# Patient Record
Sex: Female | Born: 1945 | Race: White | Hispanic: No | Marital: Married | Smoking: Never smoker
Health system: Southern US, Community
[De-identification: ages and names within clinical notes are randomized; demographics above are authoritative.]

## PROBLEM LIST (undated history)

## (undated) DIAGNOSIS — F5105 Insomnia due to other mental disorder: Secondary | ICD-10-CM

## (undated) DIAGNOSIS — R0683 Snoring: Principal | ICD-10-CM

## (undated) DIAGNOSIS — M1990 Unspecified osteoarthritis, unspecified site: Secondary | ICD-10-CM

## (undated) DIAGNOSIS — T148XXA Other injury of unspecified body region, initial encounter: Secondary | ICD-10-CM

## (undated) DIAGNOSIS — Z8719 Personal history of other diseases of the digestive system: Secondary | ICD-10-CM

## (undated) DIAGNOSIS — F409 Phobic anxiety disorder, unspecified: Secondary | ICD-10-CM

## (undated) DIAGNOSIS — H269 Unspecified cataract: Secondary | ICD-10-CM

## (undated) DIAGNOSIS — E039 Hypothyroidism, unspecified: Secondary | ICD-10-CM

## (undated) HISTORY — PX: REPLACEMENT TOTAL KNEE: SUR1224

## (undated) HISTORY — PX: COLONOSCOPY: SHX174

## (undated) HISTORY — DX: Snoring: R06.83

## (undated) HISTORY — DX: Insomnia due to other mental disorder: F40.9

## (undated) HISTORY — DX: Other injury of unspecified body region, initial encounter: T14.8XXA

## (undated) HISTORY — PX: CATARACT EXTRACTION, BILATERAL: SHX1313

## (undated) HISTORY — PX: KNEE ARTHROSCOPY: SHX127

## (undated) HISTORY — DX: Phobic anxiety disorder, unspecified: F51.05

## (undated) HISTORY — DX: Unspecified osteoarthritis, unspecified site: M19.90

## (undated) HISTORY — DX: Unspecified cataract: H26.9

---

## 1985-10-22 HISTORY — PX: ABDOMINAL HYSTERECTOMY: SHX81

## 1985-10-22 HISTORY — PX: APPENDECTOMY: SHX54

## 1998-11-29 ENCOUNTER — Other Ambulatory Visit: Admission: RE | Admit: 1998-11-29 | Discharge: 1998-11-29 | Payer: Self-pay | Admitting: Obstetrics and Gynecology

## 1999-12-06 ENCOUNTER — Other Ambulatory Visit: Admission: RE | Admit: 1999-12-06 | Discharge: 1999-12-06 | Payer: Self-pay | Admitting: Obstetrics and Gynecology

## 2000-12-16 ENCOUNTER — Other Ambulatory Visit: Admission: RE | Admit: 2000-12-16 | Discharge: 2000-12-16 | Payer: Self-pay | Admitting: Obstetrics and Gynecology

## 2001-12-17 ENCOUNTER — Other Ambulatory Visit: Admission: RE | Admit: 2001-12-17 | Discharge: 2001-12-17 | Payer: Self-pay | Admitting: Obstetrics and Gynecology

## 2002-01-05 ENCOUNTER — Emergency Department (HOSPITAL_COMMUNITY): Admission: EM | Admit: 2002-01-05 | Discharge: 2002-01-05 | Payer: Self-pay | Admitting: Emergency Medicine

## 2002-01-05 ENCOUNTER — Encounter: Payer: Self-pay | Admitting: Emergency Medicine

## 2002-09-22 ENCOUNTER — Encounter: Payer: Self-pay | Admitting: Orthopedic Surgery

## 2002-09-23 ENCOUNTER — Encounter: Payer: Self-pay | Admitting: Orthopedic Surgery

## 2002-09-23 ENCOUNTER — Inpatient Hospital Stay (HOSPITAL_COMMUNITY): Admission: RE | Admit: 2002-09-23 | Discharge: 2002-09-26 | Payer: Self-pay | Admitting: Orthopedic Surgery

## 2002-12-17 ENCOUNTER — Other Ambulatory Visit: Admission: RE | Admit: 2002-12-17 | Discharge: 2002-12-17 | Payer: Self-pay | Admitting: Obstetrics and Gynecology

## 2003-12-20 ENCOUNTER — Other Ambulatory Visit: Admission: RE | Admit: 2003-12-20 | Discharge: 2003-12-20 | Payer: Self-pay | Admitting: Obstetrics and Gynecology

## 2004-12-26 ENCOUNTER — Other Ambulatory Visit: Admission: RE | Admit: 2004-12-26 | Discharge: 2004-12-26 | Payer: Self-pay | Admitting: Obstetrics and Gynecology

## 2005-01-11 ENCOUNTER — Inpatient Hospital Stay (HOSPITAL_COMMUNITY): Admission: RE | Admit: 2005-01-11 | Discharge: 2005-01-15 | Payer: Self-pay | Admitting: Orthopedic Surgery

## 2005-10-18 ENCOUNTER — Ambulatory Visit (HOSPITAL_COMMUNITY): Admission: RE | Admit: 2005-10-18 | Discharge: 2005-10-18 | Payer: Self-pay | Admitting: Orthopedic Surgery

## 2005-10-18 ENCOUNTER — Ambulatory Visit (HOSPITAL_BASED_OUTPATIENT_CLINIC_OR_DEPARTMENT_OTHER): Admission: RE | Admit: 2005-10-18 | Discharge: 2005-10-18 | Payer: Self-pay | Admitting: Orthopedic Surgery

## 2005-12-27 ENCOUNTER — Other Ambulatory Visit: Admission: RE | Admit: 2005-12-27 | Discharge: 2005-12-27 | Payer: Self-pay | Admitting: Obstetrics and Gynecology

## 2006-12-30 ENCOUNTER — Other Ambulatory Visit: Admission: RE | Admit: 2006-12-30 | Discharge: 2006-12-30 | Payer: Self-pay | Admitting: Obstetrics and Gynecology

## 2007-10-17 ENCOUNTER — Encounter: Admission: RE | Admit: 2007-10-17 | Discharge: 2007-10-17 | Payer: Self-pay | Admitting: Sports Medicine

## 2007-12-31 ENCOUNTER — Other Ambulatory Visit: Admission: RE | Admit: 2007-12-31 | Discharge: 2007-12-31 | Payer: Self-pay | Admitting: Obstetrics and Gynecology

## 2008-06-17 ENCOUNTER — Ambulatory Visit (HOSPITAL_BASED_OUTPATIENT_CLINIC_OR_DEPARTMENT_OTHER): Admission: RE | Admit: 2008-06-17 | Discharge: 2008-06-17 | Payer: Self-pay | Admitting: Orthopedic Surgery

## 2008-10-22 HISTORY — PX: ROTATOR CUFF REPAIR: SHX139

## 2009-01-05 ENCOUNTER — Other Ambulatory Visit: Admission: RE | Admit: 2009-01-05 | Discharge: 2009-01-05 | Payer: Self-pay | Admitting: Obstetrics and Gynecology

## 2009-01-05 ENCOUNTER — Ambulatory Visit: Payer: Self-pay | Admitting: Obstetrics and Gynecology

## 2009-01-05 ENCOUNTER — Encounter: Payer: Self-pay | Admitting: Obstetrics and Gynecology

## 2009-02-02 ENCOUNTER — Ambulatory Visit: Payer: Self-pay | Admitting: Obstetrics and Gynecology

## 2010-01-06 ENCOUNTER — Ambulatory Visit: Payer: Self-pay | Admitting: Obstetrics and Gynecology

## 2010-01-06 ENCOUNTER — Other Ambulatory Visit: Admission: RE | Admit: 2010-01-06 | Discharge: 2010-01-06 | Payer: Self-pay | Admitting: Obstetrics and Gynecology

## 2010-01-19 ENCOUNTER — Ambulatory Visit: Payer: Self-pay | Admitting: Obstetrics and Gynecology

## 2011-01-08 ENCOUNTER — Encounter (INDEPENDENT_AMBULATORY_CARE_PROVIDER_SITE_OTHER): Payer: PRIVATE HEALTH INSURANCE | Admitting: Obstetrics and Gynecology

## 2011-01-08 ENCOUNTER — Encounter: Payer: Self-pay | Admitting: Obstetrics and Gynecology

## 2011-01-08 ENCOUNTER — Other Ambulatory Visit: Payer: Self-pay | Admitting: Obstetrics and Gynecology

## 2011-01-08 ENCOUNTER — Other Ambulatory Visit (HOSPITAL_COMMUNITY)
Admission: RE | Admit: 2011-01-08 | Discharge: 2011-01-08 | Disposition: A | Discharge status: Home / Self Care | Payer: PRIVATE HEALTH INSURANCE | Source: Ambulatory Visit | Attending: Obstetrics and Gynecology | Admitting: Obstetrics and Gynecology

## 2011-01-08 DIAGNOSIS — Z01419 Encounter for gynecological examination (general) (routine) without abnormal findings: Secondary | ICD-10-CM

## 2011-01-08 DIAGNOSIS — Z124 Encounter for screening for malignant neoplasm of cervix: Secondary | ICD-10-CM | POA: Insufficient documentation

## 2011-02-27 DIAGNOSIS — Z1211 Encounter for screening for malignant neoplasm of colon: Secondary | ICD-10-CM

## 2011-03-06 NOTE — Op Note (Signed)
NAMEALEERA, GILCREASE                ACCOUNT NO.:  [id number]   MEDICAL RECORD NO.:  [id number]          PATIENT TYPE:  AMB   LOCATION:  DSC                          FACILITY:  [facility]   PHYSICIAN:  Loreta Ave, M.D. DATE OF BIRTH:  [birth year]   DATE OF PROCEDURE:  06/17/2008  DATE OF DISCHARGE:                               OPERATIVE REPORT   PREOPERATIVE DIAGNOSES:  Right shoulder impingement, possible rotator  cuff tear, distal clavicle osteolysis, and degenerative arthritis with  loose bodies, glenohumeral joint.   POSTOPERATIVE DIAGNOSES:  Right shoulder impingement, possible rotator  cuff tear, distal clavicle osteolysis, and degenerative arthritis with  loose bodies, glenohumeral joint, with a full-thickness tear,  supraspinatus tendon, in the anterior aspect; grade 4 changes,  glenohumeral joint, numerous loose bodies with degenerative labral  tears.   PROCEDURES:  Right shoulder examination under anesthesia, arthroscopy,  debridement of glenohumeral joint with chondroplasty, removal of loose  bodies, debridement of labrum, bursectomy, acromioplasty, and  coracoacromial ligament release; excision, distal clavicle;  arthroscopically-assisted rotator cuff repair with fiber weave suture  and Swivel-Lock anchor x1.   SURGEON:  Loreta Ave, MD   ASSISTANT:  Genene Churn. Barry Dienes, [place].  Presence throughout the entire case was  necessary for timely completion of procedure.   ANESTHESIA:  General.   BLOOD LOSS:  Minimal.   SPECIMENS:  None.   COUNTS:  None.   COMPLICATIONS:  None.   DRESSING:  Soft, compressive, with shoulder immobilizer.   DESCRIPTION OF PROCEDURE:  The patient was brought to the operating  room, placed on operating table in supine position.  After adequate  anesthesia had been obtained placed into a beachchair position on the  shoulder positioner, prepped and draped in the usual sterile fashion.  Shoulder examined; grating and crepitus and a little  stiffness but no  significant adhesions.  Three portals created anterior, posterior, and  lateral.  Shoulder entered with blunt obturator.  Arthroscope  introduced, shoulder distended and inspected.  Extensive grade 3 and 4  changes throughout the shoulder.  Numerous loose bodies, chondral and  osteochondral, removed with shaver and grasping forceps.  Degenerative  circumferential tearing of the labrum debrided.  Chondroplastied to a  stable surface.  More than half the glenoid and a third of the humeral  head were down to bone.  Biceps tendon and biceps anchor intact.  Capsule ligamentous structures intact.  Undersurface of the cuff  initially appeared to be not torn but was very thin over the anterior  aspect of supraspinatus tendon.  Cannula redirected subacromially.  Confirmed functional full-thickness tear, supraspinatus tendon, in the  anterior aspect.  Debrided, mobilized, and the tuberosity roughened to  bleeding bone.  A lot of attrition in the cuff but still reasonable  tissue quality for repair.  Type 3 acromion.  Reactive bursitis.  Bursa  resected.  Acromioplasty to a type 1 acromion with shaver and high-speed  burr releasing CA ligament with cautery.  Distal clavicle:  Grade 4  changes, extensive spurs, especially inferiorly.  Periarticular spurs  and lateral centimeter of clavicle resected.  At completion  adequacy of  decompression and clavicle excision confirmed viewing from all portals.  Lateral portal was then opened further and a cannula placed through  that.  The Scorpion device was then used to place a horizontal mattress  suture in the mobilized cuff.  Brought out anteriorly and then out  through the lateral portals.  A drill hole was made just lateral to the  peak of the tuberosity for the Swivel-Lock anchor.  Sutures attached to  the Swivel-Lock anchor, which was then firmly implanted down in the  humerus at the tuberosity.  Bone was a little bit soft but  reasonable  and did allow good fixation, repair, and tensioning of the repair of the  suture through the Swivel-Lock device.  Sutures were cut.  The shoulder  was then viewed from all angles.  Nice firm watertight closure without  undue tension and adequate decompression confirmed.  Instruments and  fluid removed.  Portals closed with nylon.  Sterile compressive dressing  applied.  Shoulder immobilizer applied.  Anesthesia reversed.  Brought  to recovery room.  Tolerated the surgery well.  No complications.      Loreta Ave, M.D.  Electronically Signed     DFM/MEDQ  D:  06/17/2008  T:  06/18/2008  Job:  [id number]

## 2011-03-09 NOTE — Op Note (Signed)
[name], [name]                            ACCOUNT NO.:  [id number]   MEDICAL RECORD NO.:  [id number]                   PATIENT TYPE:  INP   LOCATION:  2550                                 FACILITY:  [facility]   PHYSICIAN:  Loreta Ave, M.D.              DATE OF BIRTH:  1945/11/29   DATE OF PROCEDURE:  09/23/2002  DATE OF DISCHARGE:                                 OPERATIVE REPORT   PREOPERATIVE DIAGNOSIS:  End-stage degenerative arthritis, right knee with  valgus alignment and lateral patellar femoral tracking and tethering.   POSTOPERATIVE DIAGNOSIS:  End-stage degenerative arthritis, right knee with  valgus alignment and lateral patellar femoral tracking and tethering.   OPERATION:  Right total knee replacement utilizing Osteonics prosthesis.  Press fit posterior stabilizing #9 femoral component.  Cemented #7 tibial  compartment with 10 mm polyethylene flex insert.  Cemented recessed nonmetal  backed 28 mm patellar compartment.  Soft tissue balancing with lateral  capsular release and lateral retinacular release.   SURGEON:  Loreta Ave, M.D.   ASSISTANT:  Arlys John D. Petrarca, P.A.-C.   ANESTHESIA:  General   ESTIMATED BLOOD LOSS:  Minimal   TOURNIQUET TIME:  One hour and 10 minutes   SPECIMENS:  Excised bone and soft tissues.   CULTURES:  None   COMPLICATIONS:  None   DRESSING:  Soft compressive and knee immobilizer.   DRAINS:  Hemovac x 2   DESCRIPTION OF PROCEDURE:  The patient was brought to the operating room and  placed on the operating table in the supine position.  After adequate  anesthesia had been obtained, the right knee was examined.  Mild pressure  contracture.  Further flexion 130 degrees.  The lateral capsular tightness  with lateral tracking, tethering and crepitus of the patellar femoral joint.  Alignment more than 10 degrees of valgus correctable to about 10 degrees of  valgus.  Stable ligaments.  Tourniquet applied.  Prepped and draped  in the  usual sterile fashion.  Exsanguinated with elevation of Esmarch.  Tourniquet  inflated to 300 mmHg.  Anterior incision above the patella, advanced to the  tibial tubercle.  Medial parapatellar arthrotomy exposing the knee.  Grade  IV changes of the patellar femoral joint and lateral components.  Remnants  of meniscus, cruciate ligament all debrided.  Periarticular spurs removed.  Distal femur exposed.  Intramedullary guide placed.  Distal cut removing 10  mm. set at 5 degrees of valgus.  Moderately deficiency laterally but able to  overcome this with a 10 mm cut.  Sized for a #9 component which was a little  surprising given the patient's size but was the appropriate size.  Cut was  made for the #9 posterior stabilizing component.  Trial put in place and  found to fit well.  Trial was removed.  Tibia exposed.  Tibia spine removed  with the saw.  Intramedullary guide placed.  Proximal cut removing 4 to 5 mm  off the deficient side with a 5 degree posterior slope cut.  Patella was  sized, reamed and drilled for a 28 mm component.  Trial was put in place.  #9 on the femur, #7 on the tibia.  With the 10 mm insert and the 28 mm  patellar in place, I had full extension and full flexion and nicely balanced  knee.  Lateral release performed and following this, I had good patellar  femoral tracking.  All trials were removed.  Copious irrigation with a pulse  irrigating device.  Repaired, placed on the tibial component which was  hammered in place.  Polyethylene intact.  Femoral component seated.  Knee  reduced.  Patellar component cemented into its recess in the patella.  All  excess cement removed.  Once the cement hardened, the knee was irrigated  again.  Hemovacs were placed and brought out through separate stab wounds.  Arthrotomy was closed with #1 Vicryl.  The skin and subcutaneous tissue was  closed with Vicryl and staples.  Margins of the wound of the knee injected  with Marcaine.   Sterile compressive dressing applied.  Tourniquet deflated  and removed.  Knee immobilizer was applied.  Anesthesia was reversed.  Brought to the recovery room.  Tolerated the surgery well with no  complications.                                                Loreta Ave, M.D.    DFM/MEDQ  D:  09/23/2002  T:  09/23/2002  Job:  [id number]

## 2011-03-09 NOTE — Op Note (Signed)
[name], [name]                ACCOUNT NO.:  [id number]   MEDICAL RECORD NO.:  [id number]          PATIENT TYPE:  INP   LOCATION:  5008                         FACILITY:  [facility]   PHYSICIAN:  Loreta Ave, M.D. DATE OF BIRTH:  [birth year]   DATE OF PROCEDURE:  01/11/2005  DATE OF DISCHARGE:                                 OPERATIVE REPORT   PREOPERATIVE DIAGNOSES:  End stage degenerative arthritis, left knee with  valgus alignment.   POSTOPERATIVE DIAGNOSES:  End stage degenerative arthritis, left knee with  valgus alignment.   OPERATION PERFORMED:  Left total knee replacement, Osteonics prosthesis.  Soft tissue balancing.  Cemented #9 posterior stabilized femoral component.  Cemented #7 tibial component with a 10 mm polyethylene Flex insert.  Cemented recessed 28 mm patellar component.   SURGEON:  Loreta Ave, M.D.   ASSISTANT:  Genene Churn. Denton Meek.   ANESTHESIA:  General.   ESTIMATED BLOOD LOSS:  Minimal.   TOURNIQUET TIME:  One hour and 20 minutes.   SPECIMENS:  Excised bone and soft tissue.   CULTURES:  None.   COMPLICATIONS:  None.   DRESSING:  Soft compressive with knee immobilizer.   DRAINS:  Times two.   DESCRIPTION OF PROCEDURE:  The patient was brought to the operating room and  after adequate anesthesia had been obtained, the left knee examined.  Good  extension, flexion to 130 degrees, increased valgus about 10 degrees,  correctable to about 5 degrees.  Lateral tracking patellofemoral joint but  not that much tethering.  Tourniquet applied.  Leg prepped and draped in the  usual sterile fashion.  Exsanguinated with elevation and Esmarch.  Tourniquet inflated to 350 mmHg.  Straight incision above the patella down  to the tibial tubercle.  Skin and subcutaneous tissue divided.  Medial  parapatellar arthrotomy.  Grade 4 changes, especially patella and lateral  compartment.  Medial capsular release for exposure.  Remnants of menisci,  cruciate  ligaments were removed.  Distal femur exposed.  Intramedullary  guide placed.  Distal cut removing 10 mm.  Sized for a #9 component which I  intentionally externally rotated to match the epicondylar axis and to  improve patellofemoral tracking.  Definitive cuts made.  I notched the front  of the cortex of the femur anterolaterally because of the external rotation  I had set but this was only about a millimeter.  It was smoothed.  Trial put  in place found to fit well after cuts were made on the femur.  Proximal  tibia exposed.  Tibial spine removed with a saw.  Sized for #7component.  Proximal cut with intramedullary device with a 5 degree posterior slope cut  removing 6 mm off the medial side.  Once that was complete, all recess  examined, all fragments, all spurs removed.  Patella was then sized, reamed  and drilled for a 28 mm component.  Trial was put in place.  #9 on the  femur, #7 on the tibia and a 10 mm Flex insert.  With this construct and  with the patellar trial component in place, I  had full extension, full  flexion, no lift off in flexion and good patellofemoral tracking and  stability. The tibia was marked for rotation and then hand reamed.  All  trials were removed.  Wound copiously irrigated with a pulse irrigating  device.  Cement placed on all components which were then firmly seated with  polyethylene attached to the tibia.  Once the cement hardened, the knee was  re-examined.  Full extension, full flexion and good stability and good  patellofemoral tracking.  Wound irrigated.  Hemovac was placed and brought  out through separate stab wounds.  Arthrotomy closed with  #1 Vicryl.  Skin  and subcutaneous tissue with Vicryl and staples.  Sterile compressive  dressing applied with knee immobilizer.  Anesthesia reversed after  tourniquet deflated.  Brought to recovery room.  Tolerated surgery well.  No  complications.      DFM/MEDQ  D:  01/11/2005  T:  01/11/2005  Job:   [id number]

## 2011-03-09 NOTE — Op Note (Signed)
NAMESAN, RUA                ACCOUNT NO.:  [id number]   MEDICAL RECORD NO.:  [id number]          PATIENT TYPE:  AMB   LOCATION:  DSC                          FACILITY:  [facility]   PHYSICIAN:  Loreta Ave, M.D. DATE OF BIRTH:  07-Dec-1945   DATE OF PROCEDURE:  10/18/2005  DATE OF DISCHARGE:                                 OPERATIVE REPORT   PREOPERATIVE DIAGNOSIS:  Triggering of A1 pulley, right thumb.   POSTOPERATIVE DIAGNOSIS:  Triggering of A1 pulley, right thumb.   PROCEDURE:  Release, A1 pulley, right thumb.   SURGEON:  Loreta Ave, M.D.   ASSISTANT:  Genene Churn. Denton Meek.   ANESTHESIA:  General.   ESTIMATED BLOOD LOSS:  Minimal.   TOURNIQUET TIME:  25 minutes.   SPECIMENS:  None.   CULTURES:  None.   COMPLICATIONS:  None.   DRESSING:  Soft compressive.   PROCEDURE:  The patient was brought to the operating room and after adequate  anesthesia had been obtained, tourniquet applied to the upper aspect of the  right arm.  Prepped and draped in the usual sterile fashion.  Exsanguinated  with elevation and Esmarch, tourniquet inflated to 250 mmHg.  A small  transverse incision to the volar aspect of the thumb over the A1 pulley.  Skin and subcutaneous tissue divided.  Neurovascular bundles identified,  protected and retracted.  The A1 pulley released in its entirety,  obliterating all triggering.  Some nodular density of the tendon recognized  but at completion, all triggering obliterated.  Wound irrigated.  Neurovascular  bundles inspected and intact.  Skin closed with nylon.  Margins of the wound  injected with Marcaine.  Sterile compressive dressing applied.  Tourniquet  deflated and removed.  Anesthesia reversed.  Brought to the recovery room.  Tolerated the surgery well, no complications.      Loreta Ave, M.D.  Electronically Signed     DFM/MEDQ  D:  10/18/2005  T:  10/18/2005  Job:  [id number]

## 2011-03-09 NOTE — Discharge Summary (Signed)
[name], [name]                ACCOUNT NO.:  [id number]   MEDICAL RECORD NO.:  [id number]          PATIENT TYPE:  INP   LOCATION:  5008                         FACILITY:  [facility]   PHYSICIAN:  Loreta Ave, M.D. DATE OF BIRTH:  08/13/1946   DATE OF ADMISSION:  01/11/2005  DATE OF DISCHARGE:  01/15/2005                                 DISCHARGE SUMMARY   FINAL DIAGNOSES:  1.  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  2.  Hypokalemia.   HISTORY OF PRESENT ILLNESS:  A 65 year old white female with a history of  end stage DJD left knee and chronic pain, presented to our office for  preoperative evaluation for a total knee replacement.  She had progressive  worsening symptoms with failed response to conservative treatment.  Significant decrease in her daily activities due to the ongoing complaint.  Pre-op x-rays showed end stage DJD with bone-on-bone changes and bone spurs.   HOSPITAL COURSE:  January 11, 2005, the patient was taken to the [facility]  Operating Room and a left total knee replacement procedure performed.  Surgeon was Loreta Ave, M.D. and assistant was Fayrene Fearing __________  [facility]  Anesthesia general.  EDL minimal.  Tourniquet time 80 minutes.  Two Hemovac  drains placed.  There were no surgical or anesthesia complications and the  patient was transferred to recovery in stable condition.  January 12, 2005, no specific complaints.  Temp 100.2, pulse 95, respirations  18, blood pressure 113/70.  Hemoglobin 10.7.  INR 1.0.  Dressing:  Clean,  dry, and intact.  __________  nontender.  Neurovascularly intact distally.  Pharmacy protocol Coumadin started.  January 13, 2005, patient doing well.  Slow with PT day before due to PCA pump  not working.  Vital signs stable.  Temp 99.9.  Hemoglobin 10.2.  Potassium  3.2.  INR 1.5.  Wound looked good.  Staples intact.  No signs of infection.  Calf nontender.  Neurovascularly intact  distally.  Hemovac drains x 2 pulled  and dressing changed.  D/C'd morphine PCA, O2, and Foley.  Hep-Lock IV.  KCl  20 mEq p.o. every day ordered.  January 14, 2005, the patient complained of some nausea, bloating, and minimal  abdominal discomfort.  No BM.  Minimal flatus and belching.  Dulcolax  suppository x 1 yesterday.  Complained of some left proximal calf  discomfort.  On exam abdomen slightly tender to palpation.  No palpable  masses.  Left lower extremity wound looked good.  No signs of infection.  Staples intact.  Proximal calf mildly tender to palpation, neurovascularly  intact.  Hemoglobin at 9.4.  Potassium 2.8.  INR 2.0.  Sodium 131.  Fleet  enema ordered.  D/C'd Percocet and started Darvocet-N 100 1-2 tabs p.o. q.4-  6h. p.r.n. for pain.  Increase potassium to 40 mEq p.o. x 1 dose, then 20  mEq later that evening, and then resume normal dose.  Venous Doppler ordered  for left lower extremity rule out DVT.  January 15, 2005, the patient doing much  better.  No complaints of abdominal  pain or nausea.  Had a small bowel movement.  Stated that she was ready to  go home.  Ambulated down hall and completed stairs.  Temp 98.7, pulse 87,  respirations 18, blood pressure 116/69.  Hemoglobin 9.7.  INR 1.9.  Range of  motion 0-80 degrees.  Wound looked good.  Staples intact.  No signs of  infection.   CONDITION:  Good and stable.   MEDICATIONS:  1.  Darvocet-N 100 1-2 tabs p.o. q.4-6h. p.r.n. for pain.  2.  Coumadin x 4 weeks postoperative discomfort prophylaxis.  3.  Robaxin 500 mg p.o. q.6h. p.r.n. for spasms.  4.  Phenergan 12.5 mg one tab p.o. q.6h. p.r.n. for nausea.   DISPOSITION:  Discharged home.   INSTRUCTIONS:  1.  The patient will continue to work with home health PT to improve range      of motion and strength and ambulation.  2.  Home health RN to monitor PT INR x 4 weeks while she is on Coumadin.  3.  Staples to be removed two weeks post-op.  4.  Will follow up in  the office in two weeks for recheck and return sooner      as needed.       /MEDQ  D:  04/07/2005  T:  04/07/2005  Job:  [id number]

## 2011-03-09 NOTE — Discharge Summary (Signed)
[name], [name]                            ACCOUNT NO.:  [id number]   MEDICAL RECORD NO.:  [id number]                   PATIENT TYPE:  INP   LOCATION:  5004                                 FACILITY:  [facility]   PHYSICIAN:  Loreta Ave, M.D.              DATE OF BIRTH:  1945/11/23   DATE OF ADMISSION:  09/23/2002  DATE OF DISCHARGE:  09/26/2002                                 DISCHARGE SUMMARY   ADMISSION DIAGNOSIS:  Advanced degenerative joint disease of the right knee.   DISCHARGE DIAGNOSIS:  Advanced degenerative joint disease of the right knee.   PROCEDURE:  Right total knee replacement.   HISTORY OF PRESENT ILLNESS:  A very pleasant 65 year old female with  unrelenting pain in the right knee with progressive, advancing DJD.  She now  has a marked valgus deformity with lateral pain.  She had an arthroscopy in  2002 which revealed grade 4 DJD.  Because of failure with conservative  treatment, she is now indicated for a total knee replacement.   HOSPITAL COURSE:  A 65 year old female admitted 09/23/02 after appropriate  laboratory studies were obtained, as well as 1 g of Ancef IV on call to the  operating room, was taken to the operating room where she underwent a right  total knee replacement.  She tolerated the procedure well.  An epidural was  placed for postoperative pain management.  Preop Foley was placed.  The  patient was started on heparin 5000 units subcu q.12h. until her Coumadin  became therapeutic.  Consultations with PT and OT were made.  Total knee  replacement protocols were followed.  With ambulation and PT weightbearing  as tolerated on the right, she was allowed out of bed to the chair the  following day.  Her epidural was discontinued on 09/25/02.  She did have some  hypokalemia which was corrected with oral potassium.  An antispasmodic,  Robaxin 500 mg q.6-8h. p.r.n., was also used.  When she was ambulatory and  stable, it was felt that she was able then  to be discharged.  This was  accomplished on 09/26/02.  She was discharged in good condition.  EKG  revealed normal sinus rhythm with short PR.  T wave abnormality, consider  anterior ischemia.  Radiographic studies:  X-ray of the right reveals right  total arthroplasty implants.   LABORATORY DATA:  Preop hemoglobin 13.2, hematocrit 38.0%, white count 5100,  platelets 180,000.  Discharge hemoglobin 8.5, hematocrit 24.1%.  Chemistries  preop:  Sodium 137, potassium 4.0, chloride 101, CO2 29, glucose 89, BUN 16,  creatinine 0.6, calcium 9.3, total protein 6.8, albumin 4.0.  Sodium 139,  potassium 3.1, chloride 103, CO28, glucose 97, BUN 2, creatinine 0.7, and  calcium 8.5.  Urinalysis was benign for voided urine.  She was blood type O  positive, antibody screen negative.   DISCHARGE INSTRUCTIONS:  She will resume her preop medications.  Coumadin as  directed by [place].  Iron supplementation tablet b.i.d.  Percocet 5/325 1-2  tablets q.4h. as needed for pain.  Robaxin 500 mg 1-2 tablets q.6h. as  needed for spasm.  She will be weightbearing as tolerated on the right with  a walker.  This is tied with physical therapy.  Home CPM, 0-90 degrees, six  to eight hours per day.  There is no restriction in her diet.  Keep incision  clean and dry and may shower.  Follow back up with [place] in 7-10 days for  recheck evaluation and staple removal.  Call if increase in temperature,  chills, pain unrelieved by medicines or foul smelling drainage from the  wound.   DISPOSITION:  Discharged in improved condition.     Oris Drone Petrarca, P.A.-C.                Loreta Ave, M.D.    BDP/MEDQ  D:  11/08/2002  T:  11/09/2002  Job:  [id number]

## 2011-03-09 NOTE — Op Note (Signed)
   NAME:  [name], [name]                            ACCOUNT NO.:  [id number]   MEDICAL RECORD NO.:  [id number]                   PATIENT TYPE:  INP   LOCATION:  2550                                 FACILITY:  [facility]   PHYSICIAN:  Kaylyn Layer. Michelle Piper, M.D.                DATE OF BIRTH:  15-Sep-1946   DATE OF PROCEDURE:  09/23/2002  DATE OF DISCHARGE:                                 OPERATIVE REPORT   PROCEDURE:  Epidural catheter placement for postoperative pain relief.   FOLLOW UP:  Followup with Dr. Richardson Landry.   DESCRIPTION OF PROCEDURE:  The risks and benefits were discussed with the  patient ahead of time and she agreed to the catheter placement. At the end  of the procedure prior to emerging from anesthesia, the patient was turned  in the right lateral decubitus position. The back was prepped with Betadine.   Using a #17 Tuohy needle, the epidural space was easily cannulated in the  midline utilizing loss of resistance technique. The catheter was inserted 5  cm into the epidural space after negative aspiration. Then 100 mcg of  fentanyl and 10 cc of 100% Xylocaine was injected.   The patient was  then turned supine and laryngeal mask airway was removed  and the patient was then to the recovery room in stable condition. In the  PACU a fentanyl/Marcaine infusion was begun and should be followed on the  floor by the anesthesiology service.                                                 Kaylyn Layer Michelle Piper, M.D.    KDO/MEDQ  D:  09/23/2002  T:  09/23/2002  Job:  [id number]

## 2011-11-13 ENCOUNTER — Encounter: Payer: Self-pay | Admitting: Obstetrics and Gynecology

## 2012-01-03 DIAGNOSIS — N809 Endometriosis, unspecified: Secondary | ICD-10-CM | POA: Insufficient documentation

## 2012-01-03 DIAGNOSIS — M8580 Other specified disorders of bone density and structure, unspecified site: Secondary | ICD-10-CM | POA: Insufficient documentation

## 2012-01-14 ENCOUNTER — Ambulatory Visit (INDEPENDENT_AMBULATORY_CARE_PROVIDER_SITE_OTHER): Payer: Medicare Other | Admitting: Obstetrics and Gynecology

## 2012-01-14 ENCOUNTER — Telehealth: Payer: Self-pay | Admitting: *Deleted

## 2012-01-14 ENCOUNTER — Encounter: Payer: Self-pay | Admitting: Obstetrics and Gynecology

## 2012-01-14 VITALS — BP 120/64 | Ht 64.0 in | Wt 132.0 lb

## 2012-01-14 DIAGNOSIS — R350 Frequency of micturition: Secondary | ICD-10-CM

## 2012-01-14 DIAGNOSIS — Z780 Asymptomatic menopausal state: Secondary | ICD-10-CM

## 2012-01-14 DIAGNOSIS — M899 Disorder of bone, unspecified: Secondary | ICD-10-CM

## 2012-01-14 DIAGNOSIS — M8580 Other specified disorders of bone density and structure, unspecified site: Secondary | ICD-10-CM

## 2012-01-14 DIAGNOSIS — N952 Postmenopausal atrophic vaginitis: Secondary | ICD-10-CM

## 2012-01-14 DIAGNOSIS — R82994 Hypercalciuria: Secondary | ICD-10-CM

## 2012-01-14 DIAGNOSIS — N951 Menopausal and female climacteric states: Secondary | ICD-10-CM

## 2012-01-14 MED ORDER — ZOSTER VACCINE LIVE 19400 UNT/0.65ML ~~LOC~~ SOLR: 0.6500 mL | Freq: Once | SUBCUTANEOUS | Status: AC

## 2012-01-14 MED ORDER — TRIAMTERENE-HCTZ 37.5-25 MG PO CAPS: 1.0000 | ORAL_CAPSULE | ORAL | Status: DC

## 2012-01-14 NOTE — Telephone Encounter (Signed)
rx sent in patient informed.

## 2012-01-14 NOTE — Progress Notes (Signed)
Patient came back to see me today for further followup. 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 She is taking Dyazide for treatment. When we last did her 24-hour urine collection and was stable. We have had her see Dr. Valarie Cones because of height loss and some osteopenia. Her last bone density done in 2012 showed a worse T score of -1.6 without  an elevated FRAX risk. It was stable. She's had no fractures. She takes calcium and vitamin D. She is up-to-date on mammograms. She has no vaginal bleeding. She's had no pelvic pain. She does have hot flashes but feels they're tolerable. She does have atrophic vaginitis with some discomfort with intercourse but they did do  not have intercourse enough to make it worth treating in her mind. She does have some urinary frequency without significant urgency, dysuria, hematuria, or incontinence.  ROS: 12 system review done. Pertinent positives above.  HEENT: Within normal limits.  Kennon Portela present Neck: No masses. Supraclavicular lymph nodes: Not enlarged. Breasts: Examined in both sitting and lying position. Symmetrical without skin changes or masses. Abdomen: Soft no masses guarding or rebound. No hernias. Pelvic: External within normal limits. BUS within normal limits. Vaginal examination shows poor estrogen effect, no cystocele enterocele or rectocele. Cervix and uterus absent. Adnexa within normal limits. Rectovaginal confirmatory. Extremities within normal limits.  Assessment: #1. Menopausal symptoms #2. Atrophic vaginitis #3. Urinary frequency #4. Osteopenia with hypercalciuria.  Plan: Continue yearly mammograms. Continue periodic bone densities. Continue Dyazide.

## 2012-01-14 NOTE — Telephone Encounter (Signed)
Patient said she forgot to ask for prescription for Shingles Vaccine to be sent to her [facility] pharmacy in [place].

## 2012-01-14 NOTE — Telephone Encounter (Signed)
Please send

## 2012-01-15 LAB — URINALYSIS W MICROSCOPIC + REFLEX CULTURE
Bacteria, UA: NONE SEEN
Crystals: NONE SEEN
Ketones, ur: NEGATIVE mg/dL
Nitrite: NEGATIVE
Specific Gravity, Urine: 1.019 (ref 1.005–1.030)
Squamous Epithelial / LPF: NONE SEEN
Urobilinogen, UA: 0.2 mg/dL (ref 0.0–1.0)

## 2012-07-29 ENCOUNTER — Telehealth: Payer: Self-pay | Admitting: Family Medicine

## 2012-07-29 NOTE — Telephone Encounter (Signed)
Her husband, a patient of mine, sent me a e mail asking me if I would see her for an acute visit. She's been a patient of mine in the past. I told him I would agree to do this. I had no knowledge that an  appointment had been  made with Jae Dire. Confer with the patient and see if she does indeed want to see me

## 2012-07-29 NOTE — Telephone Encounter (Signed)
Also note Patient has medicare

## 2012-07-29 NOTE — Telephone Encounter (Signed)
I'm confused- pt is going to see me prior to establishing?  But she's medicare and i'm not taking medicare?  And spouse said this was ok b/c Dr Alwyn Ren ok'd this?  Someone needs to clear this up for me and let me know what's going on.  I'm not sure why other people are ok-ing new pt add ons to my schedule in a 15 min slot (especially for new medicare)

## 2012-07-29 NOTE — Telephone Encounter (Signed)
new pt on schedule for Thursday per spouse, per hopper pt could be seen by you prior to being seen by you for Establish care visit, please advise if ok?

## 2012-07-30 ENCOUNTER — Ambulatory Visit (INDEPENDENT_AMBULATORY_CARE_PROVIDER_SITE_OTHER): Payer: Medicare Other | Admitting: Internal Medicine

## 2012-07-30 VITALS — BP 138/82 | HR 80 | Temp 98.2°F | Wt 135.0 lb

## 2012-07-30 DIAGNOSIS — J209 Acute bronchitis, unspecified: Secondary | ICD-10-CM

## 2012-07-30 MED ORDER — HYDROCODONE-ACETAMINOPHEN 7.5-500 MG PO TABS: 1.0000 | ORAL_TABLET | ORAL | Status: DC | PRN

## 2012-07-30 MED ORDER — PREDNISONE 20 MG PO TABS: 20.0000 mg | ORAL_TABLET | Freq: Two times a day (BID) | ORAL | Status: DC

## 2012-07-30 MED ORDER — DOXYCYCLINE HYCLATE 100 MG PO TABS: 100.0000 mg | ORAL_TABLET | Freq: Two times a day (BID) | ORAL | Status: DC

## 2012-07-30 NOTE — Patient Instructions (Addendum)
If you activate My Chart; the results can be released to you as soon as they populate from the lab. If you choose not to use this program; the labs have to be reviewed, copied & mailed   causing a delay in getting the results to you. 

## 2012-07-30 NOTE — Telephone Encounter (Signed)
[name] stated his wife was wanting to see [name] and per his email from [name]., that was ok. At the time I did not have the email that is why I sent the note. After receiving the email [name] noted to his patient that he would see the spouse unless she wanted to see [name]. I think [name] took this as it was ok for his wife to see whomever she wanted.  I can call the patient back to clarify that she can only see hopper.

## 2012-07-30 NOTE — Progress Notes (Signed)
  Subjective:    Patient ID: [name], female    DOB: Apr 06, 1946, 66 y.o.   MRN: [id number]  HPI  She has had a persistent cough over the last 11 days; onset was laryngitis and sore throat. The cough has not been significantly responsive to [employer]. She has been intolerant to codeine in the past. She has no significant smoking or asthma histories.  Within the last several days she's begun to produce some yellow sputum. There is no associated wheezing or shortness of breath.   Review of Systems  She specifically denies fever, chills, sweats, frontal headache, facial pain, nasal purulence, dental pain, otic pain or discharge      Objective:   Physical Exam General appearance:good health ;well nourished; no acute distress or increased work of breathing is present.  No  lymphadenopathy about the head, neck, or axilla noted.   Eyes: No conjunctival inflammation or lid edema is present.   Ears:  External ear exam shows no significant lesions or deformities.  Otoscopic examination reveals clear canals, tympanic membranes are intact bilaterally without bulging, retraction, inflammation or discharge.  Nose:  External nasal examination shows no deformity or inflammation. Nasal mucosa are pink and moist without lesions or exudates. No septal dislocation or deviation.No obstruction to airflow.   Oral exam: Dental hygiene is good; lips and gums are healthy appearing.There is no oropharyngeal erythema or exudate noted.   Neck:  No deformities,  masses, or tenderness noted.     Heart:  Normal rate and regular rhythm. S1 and S2 normal without gallop, murmur, click, rub or other extra sounds.   Lungs:Chest clear to auscultation; no wheezes, rhonchi,rales ,or rubs present.No increased work of breathing. Dry barking cough   Extremities:  No cyanosis, edema, or clubbing  noted . She has mixed arthritic changes in hands   Skin: Warm & dry           Assessment & Plan:  #1  bronchitis, acute ; no associated bronchospasm or rhinosinusitis  #2 past history of minor intolerance to codeine.  Plan: See orders and recommendations

## 2012-07-30 NOTE — Telephone Encounter (Signed)
Called [name] this morning clarified all with her, she is coming in today to see hopp at 11:30am  She was ok with everything

## 2012-07-31 ENCOUNTER — Ambulatory Visit: Payer: Medicare Other | Admitting: Family Medicine

## 2012-08-01 ENCOUNTER — Telehealth: Payer: Self-pay

## 2012-08-01 NOTE — Telephone Encounter (Signed)
Spoke to pt, Pt asking questions about Rx doxycycline (VIBRA-TABS) 100 MG tablet wondering if he gave her the wrong Rx because she has bronchitis. Advised pt that she received correct Rx from doctor per OV notes. Pt stated understanding. MW

## 2012-08-11 ENCOUNTER — Encounter: Payer: Self-pay | Admitting: Internal Medicine

## 2012-08-12 ENCOUNTER — Ambulatory Visit (INDEPENDENT_AMBULATORY_CARE_PROVIDER_SITE_OTHER): Payer: Medicare Other | Admitting: Internal Medicine

## 2012-08-12 ENCOUNTER — Encounter: Payer: Self-pay | Admitting: Internal Medicine

## 2012-08-12 VITALS — BP 134/82 | HR 85 | Temp 98.7°F | Wt 134.4 lb

## 2012-08-12 DIAGNOSIS — R05 Cough: Secondary | ICD-10-CM

## 2012-08-12 MED ORDER — BUDESONIDE-FORMOTEROL FUMARATE 160-4.5 MCG/ACT IN AERO: 2.0000 | INHALATION_SPRAY | Freq: Two times a day (BID) | RESPIRATORY_TRACT | Status: DC

## 2012-08-12 NOTE — Patient Instructions (Addendum)
Order for x-rays entered into  the computer; these will be performed at [facility]. No appointment is necessary.    

## 2012-08-12 NOTE — Progress Notes (Signed)
  Subjective:    Patient ID: [name], female    DOB: 1945-11-20, 66 y.o.   MRN: [id number]  HPI  Symptoms began almost one month ago has laryngitis followed by a nonproductive cough. It did not respond significantly to [employer]. She has now completed a course of doxycycline and oral steroids; but the cough persists unchanged She is not on an ACE inhibitor and denies a history of asthma.    Review of Systems  Associated symptoms/signs:  URI symptoms: No facial pain, frontal headaches, nasal purulence, dental pain,persistent sorethroat, or ear ache/otic discharge Extrinsic symptoms: No itchy eyes, sneezing, or angioedema Infectious symptoms: No fever, chills, sweats (only menopausal), and purulent secretions Chest symptoms: No pleuritic pain, significant sputum production, hemoptysis, dyspnea, or wheezing GI symptoms: No dyspepsia, dysphagia, reflux symptoms Occupational[place]environmental exposures [place] Smoking history:never Past medical history/family history pulmonary disease: no     Objective:   Physical Exam General appearance:thin ,good health ;well nourished; no acute distress or increased work of breathing is present.  No  lymphadenopathy about the head, neck, or axilla noted.   Eyes: No conjunctival inflammation or lid edema is present.   Ears:  External ear exam shows no significant lesions or deformities.  Otoscopic examination reveals clear canals, tympanic membranes are intact bilaterally without bulging, retraction, inflammation or discharge.  Nose:  External nasal examination shows no deformity or inflammation. Nasal mucosa are pink and moist without lesions or exudates. No septal dislocation or deviation.No obstruction to airflow.   Oral exam: Dental hygiene is good; lips and gums are healthy appearing.There is no oropharyngeal erythema or exudate noted.   Neck:  No deformities,  masses, or tenderness noted.     Heart:  Normal rate and regular  rhythm. S1 and S2 normal without gallop, murmur, click, rub or other extra sounds.   Lungs:Chest clear to auscultation; no wheezes, rhonchi,rales ,or rubs present.No increased work of breathing.    Extremities:  No cyanosis, edema, or clubbing  noted    Skin: Warm & dry           Assessment & Plan:  #1cough, protracted . RAD suggested Plan: See orders and recommendations

## 2012-08-13 ENCOUNTER — Ambulatory Visit (HOSPITAL_BASED_OUTPATIENT_CLINIC_OR_DEPARTMENT_OTHER)
Admission: RE | Admit: 2012-08-13 | Discharge: 2012-08-13 | Disposition: A | Discharge status: Home / Self Care | Payer: Medicare Other | Source: Ambulatory Visit | Attending: Internal Medicine | Admitting: Internal Medicine

## 2012-08-13 DIAGNOSIS — R0989 Other specified symptoms and signs involving the circulatory and respiratory systems: Secondary | ICD-10-CM | POA: Insufficient documentation

## 2012-08-13 DIAGNOSIS — R05 Cough: Secondary | ICD-10-CM | POA: Insufficient documentation

## 2012-08-13 DIAGNOSIS — R059 Cough, unspecified: Secondary | ICD-10-CM | POA: Insufficient documentation

## 2012-09-02 ENCOUNTER — Other Ambulatory Visit: Payer: Self-pay

## 2012-09-02 MED ORDER — PNEUMOCOCCAL VAC POLYVALENT 25 MCG/0.5ML IJ INJ: 0.5000 mL | INJECTION | Freq: Once | INTRAMUSCULAR | Status: DC

## 2012-09-10 ENCOUNTER — Ambulatory Visit (INDEPENDENT_AMBULATORY_CARE_PROVIDER_SITE_OTHER): Payer: Medicare Other

## 2012-09-10 DIAGNOSIS — Z299 Encounter for prophylactic measures, unspecified: Secondary | ICD-10-CM

## 2012-09-10 DIAGNOSIS — Z23 Encounter for immunization: Secondary | ICD-10-CM

## 2012-10-24 ENCOUNTER — Encounter: Payer: Self-pay | Admitting: Lab

## 2012-10-27 ENCOUNTER — Encounter: Payer: Self-pay | Admitting: Internal Medicine

## 2012-10-27 ENCOUNTER — Ambulatory Visit (INDEPENDENT_AMBULATORY_CARE_PROVIDER_SITE_OTHER): Payer: Medicare Other | Admitting: Internal Medicine

## 2012-10-27 VITALS — BP 112/70 | HR 83 | Resp 14 | Ht 64.0 in | Wt 131.8 lb

## 2012-10-27 DIAGNOSIS — M2550 Pain in unspecified joint: Secondary | ICD-10-CM

## 2012-10-27 DIAGNOSIS — M8580 Other specified disorders of bone density and structure, unspecified site: Secondary | ICD-10-CM

## 2012-10-27 DIAGNOSIS — M899 Disorder of bone, unspecified: Secondary | ICD-10-CM

## 2012-10-27 LAB — SEDIMENTATION RATE: Sed Rate: 12 mm/hr (ref 0–22)

## 2012-10-27 NOTE — Progress Notes (Signed)
Subjective:    Patient ID: [name], female    DOB: 25-Apr-1946, 67 y.o.   MRN: [id number]  HPI Medicare Wellness Visit:  The following psychosocial & medical history were reviewed as required by Medicare.   Social history: caffeine: none , alcohol:  4/ week ,  tobacco use : never  & exercise : trainer 2 X/ week ; CVE 7X/week.   Home & personal  safety / fall risk: no issues, activities of daily living: no limitations , seatbelt use : yes , and smoke alarm employment : yes .  Power of Attorney/Living Will status : in place  Vision ( as recorded per Nurse) & Hearing  evaluation :  Ophth exam  2013; no hearing exam. Orientation :oriented X 3, memory & recall :good, spelling  testing: good,and mood & affect : normal . Depression / anxiety: denied Travel history : 11/13 [place] , immunization status : PNA needed , transfusion history:  no, and preventive health surveillance ( colonoscopies, BMD , etc as per protocol/ [facility]): colonoscopy up to date, Dental care:  Every 6 mos . Chart reviewed &  Updated. Active issues reviewed & addressed.       Review of Systems She has arthralgias of her hands and knees. The rheumatologist who was giving her intra-articular steroid injections questioned rheumatoid arthritis. Medications were never initiated as she was concerned about potential adverse effects.  She has had total knee replacements bilaterally.  She is not taking any medication for the joint symptoms. Apparently there's been no definitive testing concerning the arthralgias.  She has been seen at [facility] by Dr. Valarie Cones for osteopenia. She is not on vitamin D at this time    Objective:   Physical Exam Gen.: Thin but healthy and well-nourished in appearance. Alert, appropriate and cooperative throughout exam. Head: Normocephalic without obvious abnormalities  Eyes: No corneal or conjunctival inflammation noted. Pupils equal round reactive to light and accommodation. Fundal exam is benign without  hemorrhages, exudate, papilledema. Extraocular motion intact. Vision grossly normal. Ears: External  ear exam reveals no significant lesions or deformities. Canals clear .TMs normal. Hearing is grossly normal bilaterally. Nose: External nasal exam reveals no deformity or inflammation. Nasal mucosa are pink and moist. No lesions or exudates noted.   Mouth: Oral mucosa and oropharynx reveal no lesions or exudates. Teeth in good repair. Neck: No deformities, masses, or tenderness noted. Range of motion decreased. Thyroid normal. Lungs: Normal respiratory effort; chest expands symmetrically. Lungs are clear to auscultation without rales, wheezes, or increased work of breathing. Heart: Normal rate and rhythm. Normal S1 and S2. No gallop, click, or rub. S4 w/o murmur. Abdomen: Bowel sounds normal; abdomen soft and nontender. No masses, organomegaly or hernias noted. Genitalia: Dr Eda Paschal, [facility] Musculoskeletal/extremities: There is some asymmetry of the posterior thoracic musculature suggesting occult scoliosis. . No clubbing, cyanosis,or  Edema. Mixed finger deformities noted. Range of motion  normal .Tone & strength  Normal.Crepitus L > R knee. Nail health  good. Vascular: Carotid, radial artery, dorsalis pedis and  posterior tibial pulses are full and equal. No bruits present. Neurologic: Alert and oriented x3. Deep tendon reflexes symmetrical and normal.          Skin: Intact without suspicious lesions or rashes. Lymph: No cervical, axillary lymphadenopathy present. Psych: Mood and affect are normal. Normally interactive  Assessment & Plan:  #1 Medicare Wellness Exam; criteria met ; data entered #2 arthralgias , multiple joints #3 Osteopenia Plan: see Orders

## 2012-10-27 NOTE — Patient Instructions (Addendum)
Please sign a release of records to Dr Silvio Clayman for records related to prior labs. Review and correct the record as indicated. Please share record with all medical staff seen.

## 2012-10-28 LAB — CYCLIC CITRUL PEPTIDE ANTIBODY, IGG: Cyclic Citrullin Peptide Ab: <2 U/mL (ref 0.0–5.0)

## 2012-12-02 ENCOUNTER — Encounter: Payer: Self-pay | Admitting: Women's Health

## 2012-12-06 ENCOUNTER — Other Ambulatory Visit: Payer: Self-pay

## 2012-12-08 ENCOUNTER — Other Ambulatory Visit: Payer: Self-pay | Admitting: *Deleted

## 2012-12-08 DIAGNOSIS — R928 Other abnormal and inconclusive findings on diagnostic imaging of breast: Secondary | ICD-10-CM

## 2012-12-15 ENCOUNTER — Other Ambulatory Visit: Payer: Self-pay | Admitting: Gynecology

## 2012-12-15 DIAGNOSIS — R928 Other abnormal and inconclusive findings on diagnostic imaging of breast: Secondary | ICD-10-CM

## 2013-01-15 ENCOUNTER — Encounter: Payer: Self-pay | Admitting: Women's Health

## 2013-01-15 ENCOUNTER — Ambulatory Visit (INDEPENDENT_AMBULATORY_CARE_PROVIDER_SITE_OTHER): Payer: Medicare Other | Admitting: Women's Health

## 2013-01-15 VITALS — BP 112/68 | Ht 64.0 in | Wt 136.0 lb

## 2013-01-15 DIAGNOSIS — M949 Disorder of cartilage, unspecified: Secondary | ICD-10-CM

## 2013-01-15 DIAGNOSIS — M899 Disorder of bone, unspecified: Secondary | ICD-10-CM

## 2013-01-15 DIAGNOSIS — M8580 Other specified disorders of bone density and structure, unspecified site: Secondary | ICD-10-CM

## 2013-01-15 MED ORDER — TRIAMTERENE-HCTZ 37.5-25 MG PO CAPS: 1.0000 | ORAL_CAPSULE | ORAL | Status: DC

## 2013-01-15 NOTE — Patient Instructions (Addendum)

## 2013-01-15 NOTE — Progress Notes (Signed)
[name] November 10, 1945 [id number]    History:    The patient presents for Breast and pelvic exam. TAH with BSO in 87 for endometriosis. Osteopenia with hypercalciuria on Dyazide. T score -1.6 right hip,  FRAX 11.8%/1.6%  03/2011. Is seen at [facility]/Dr [name] for problem, medications prescribed by our office. Mammogram 11/2012 normal after diagnostic mammogram to right breast. Has had zostavac and Pneumovax vaccines. History of normal Paps. Arthritis, insomnia primary care.  Past medical history, past surgical history, family history and social history were all reviewed and documented in the EPIC chart. Both knees replaced.  Mother died of ovarian cancer. 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  Exam:  Filed Vitals:   01/15/13 1417  BP: 112/68    General appearance:  Normal Head/Neck:  Normal, without cervical or supraclavicular adenopathy. Thyroid:  Symmetrical, normal in size, without palpable masses or nodularity. Respiratory  Effort:  Normal  Auscultation:  Clear without wheezing or rhonchi Cardiovascular  Auscultation:  Regular rate, without rubs, murmurs or gallops  Edema/varicosities:  Not grossly evident Abdominal  Soft,nontender, without masses, guarding or rebound.  Liver/spleen:  No organomegaly noted  Hernia:  None appreciated  Skin  Inspection:  Grossly normal  Palpation:  Grossly normal Neurologic/psychiatric  Orientation:  Normal with appropriate conversation.  Mood/affect:  Normal  Genitourinary    Breasts: Examined lying and sitting.     Right: Without masses, retractions, discharge or axillary adenopathy.     Left: Without masses, retractions, discharge or axillary adenopathy.   Inguinal/mons:  Normal without inguinal adenopathy  External genitalia:  Normal  BUS/Urethra/Skene's glands:  Normal  Bladder:  Normal  Vagina:  Normal  Cervix:  absent  Uterus:  absent  Adnexa/parametria:     Rt: Without masses or tenderness.   Lt: Without masses or tenderness.  Anus  and perineum: Normal  Digital rectal exam: Normal sphincter tone without palpated masses or tenderness  Assessment/Plan:  67 y.o. MWF G0 for breast and pelvic exam.  Osteopenia on Dyazide for hypercalciuria Dr. Valarie Sanders at [facility] Mild atrophic vaginitis TAH with BSO for endometriosis/no HRT  Plan: Repeat DEXA 03/2013 and followup at [facility]. Dyazide 37.5 daily prescription, proper use given and reviewed. Will notify office if changes in medication after followup with Dr. Marena Sanders. SBE's, continue annual mammogram, calcium rich diet, vitamin D 2000 daily. Home safety and fall prevention discussed and importance of daily exercise reviewed. Has a healthy lifestyle, works with a Psychologist, educational and will continue.    [name] [facility], 5:28 PM 01/15/2013

## 2013-01-16 ENCOUNTER — Encounter: Payer: Self-pay | Admitting: Obstetrics and Gynecology

## 2013-01-20 ENCOUNTER — Encounter: Payer: Self-pay | Admitting: Gastroenterology

## 2013-01-22 ENCOUNTER — Other Ambulatory Visit: Payer: Self-pay | Admitting: *Deleted

## 2013-01-22 DIAGNOSIS — M8580 Other specified disorders of bone density and structure, unspecified site: Secondary | ICD-10-CM

## 2013-01-28 ENCOUNTER — Encounter: Payer: Self-pay | Admitting: Gastroenterology

## 2013-02-05 ENCOUNTER — Other Ambulatory Visit: Payer: Self-pay | Admitting: Anesthesiology

## 2013-02-05 DIAGNOSIS — Z1211 Encounter for screening for malignant neoplasm of colon: Secondary | ICD-10-CM

## 2013-02-26 ENCOUNTER — Ambulatory Visit (AMBULATORY_SURGERY_CENTER): Payer: Medicare Other | Admitting: *Deleted

## 2013-02-26 ENCOUNTER — Encounter: Payer: Self-pay | Admitting: Gastroenterology

## 2013-02-26 VITALS — Ht 64.0 in | Wt 137.2 lb

## 2013-02-26 DIAGNOSIS — Z1211 Encounter for screening for malignant neoplasm of colon: Secondary | ICD-10-CM

## 2013-02-26 MED ORDER — MOVIPREP 100 G PO SOLR: 1.0000 | Freq: Once | ORAL | Status: DC

## 2013-02-26 NOTE — Progress Notes (Signed)
No egg or soy allergy. ewm No problems with past sedation. ewm No home oxygen use. ewm Dr Corinda Gubler did last colonoscopies. ewm

## 2013-03-10 ENCOUNTER — Encounter: Payer: Self-pay | Admitting: Gastroenterology

## 2013-03-10 ENCOUNTER — Ambulatory Visit (AMBULATORY_SURGERY_CENTER): Payer: Medicare Other | Admitting: Gastroenterology

## 2013-03-10 VITALS — BP 125/92 | HR 71 | Temp 98.6°F | Resp 16 | Ht 64.0 in | Wt 137.0 lb

## 2013-03-10 DIAGNOSIS — Z1211 Encounter for screening for malignant neoplasm of colon: Secondary | ICD-10-CM

## 2013-03-10 MED ORDER — SODIUM CHLORIDE 0.9 % IV SOLN: 500.0000 mL | INTRAVENOUS | Status: DC

## 2013-03-10 NOTE — Progress Notes (Signed)
Patient did not experience any of the following events: a burn prior to discharge; a fall within the facility; wrong site/side/patient/procedure/implant event; or a hospital transfer or hospital admission upon discharge from the facility. (G8907) Patient did not have preoperative order for IV antibiotic SSI prophylaxis. (G8918)  

## 2013-03-10 NOTE — Op Note (Signed)
[facility] [address] [place], [address]   COLONOSCOPY PROCEDURE REPORT  PATIENT: [name], [name]  MR#: [id number] BIRTHDATE: [birth year] , 66  yrs. old GENDER: Female ENDOSCOPIST: Meryl Dare, MD, [facility] PROCEDURE DATE:  03/10/2013 PROCEDURE:   Colonoscopy, screening ASA CLASS:   Class II INDICATIONS:average risk screening. MEDICATIONS: MAC sedation, administered by CRNA and propofol (Diprivan) 350mg  IV DESCRIPTION OF PROCEDURE:   After the risks benefits and alternatives of the procedure were thoroughly explained, informed consent was obtained.  A digital rectal exam revealed no abnormalities of the rectum.   The LB [id number]  endoscope was introduced through the anus and advanced to the cecum, which was identified by both the appendix and ileocecal valve. No adverse events experienced with a tortuous colon.   The quality of the prep was good, using MoviPrep  The instrument was then slowly withdrawn as the colon was fully examined.  COLON FINDINGS: A normal appearing cecum, ileocecal valve, and appendiceal orifice were identified.  The ascending, hepatic flexure, transverse, splenic flexure, descending, sigmoid colon and rectum appeared unremarkable.  No polyps or cancers were seen. Retroflexed views revealed no abnormalities. The time to cecum=4 minutes 40 seconds.  Withdrawal time=8 minutes 40 seconds.  The scope was withdrawn and the procedure completed.  COMPLICATIONS: There were no complications.  ENDOSCOPIC IMPRESSION: 1.  Normal colon  RECOMMENDATIONS: 1.  Continue current colorectal screening recommendations for "routine risk" patients with a repeat colonoscopy in 10 years.   eSigned:  Meryl Dare, MD, [facility] 03/10/2013 10:00 AM

## 2013-03-10 NOTE — Patient Instructions (Addendum)

## 2013-03-11 ENCOUNTER — Telehealth: Payer: Self-pay | Admitting: *Deleted

## 2013-03-11 NOTE — Telephone Encounter (Signed)
No answer, left message to call for questions or concerns. 

## 2013-03-12 ENCOUNTER — Encounter: Payer: Self-pay | Admitting: Nurse Practitioner

## 2013-03-12 ENCOUNTER — Ambulatory Visit (INDEPENDENT_AMBULATORY_CARE_PROVIDER_SITE_OTHER): Payer: Medicare Other | Admitting: Nurse Practitioner

## 2013-03-12 VITALS — BP 110/70 | HR 88 | Ht 64.0 in | Wt 135.0 lb

## 2013-03-12 DIAGNOSIS — M5020 Other cervical disc displacement, unspecified cervical region: Secondary | ICD-10-CM

## 2013-03-12 DIAGNOSIS — G4700 Insomnia, unspecified: Secondary | ICD-10-CM

## 2013-03-12 MED ORDER — TIZANIDINE HCL 4 MG PO TABS: 4.0000 mg | ORAL_TABLET | Freq: Every day | ORAL | Status: DC

## 2013-03-12 MED ORDER — QUETIAPINE FUMARATE 50 MG PO TABS: 50.0000 mg | ORAL_TABLET | Freq: Every day | ORAL | Status: DC

## 2013-03-12 NOTE — Patient Instructions (Addendum)
Continue Tizanidine 4mg  every night will refill Continue Seroquel will refill F/U 6 months

## 2013-03-12 NOTE — Progress Notes (Signed)
HPI: Patient returns for followup after last visit 11/12/2012. Tizanidine has helped for her symptoms of cervical spine disease and it also helps her to sleep. She has a history of chronic insomnia and is currently on Seroquel doing well on that medication she continues to exercise daily. She has no new neurologic complaints  ROS:  Negative                                Physical Exam General: well developed, well nourished, seated, in no evident distress Head: head normocephalic and atraumatic. Oropharynx benign Neck: supple with no carotid or supraclavicular bruits Cardiovascular: regular rate and rhythm, no murmurs Musculoskeletal skeletal changes in the hands fingers shoulders and the knees  Neurologic Exam Mental Status: Awake and fully alert. Oriented to place and time. Follows all commands  Mood and affect appropriate.  Cranial Nerves:Pupils equal, briskly reactive to light. Extraocular movements full without nystagmus. Visual fields full to confrontation. Hearing intact and symmetric to finger snap. Facial sensation intact. Face, tongue, palate move normally and symmetrically. Neck flexion and extension normal.  Motor: Normal bulk and tone. Normal strength in all tested extremity muscles. No focal weakness Sensory.: intact to touch and pinprick and vibratory.  Coordination: Rapid alternating movements normal in all extremities. Finger-to-nose and heel-to-shin performed accurately bilaterally. Gait and Station: Arises from chair without difficulty. Stance is normal. Gait demonstrates normal stride length and balance . Able to heel, toe and tandem walk without difficulty.  Reflexes: 1+ and symmetric. Toes downgoing.     ASSESSMENT: Chronic insomnia doing well on Seroquel Degenerative cervical disease doing well on Tizanidine     PLAN: Continue Tizanidine 4mg  every night will refill Continue Seroquel will refill F/U 6 months          Nilda Riggs, GNP-BC APRN

## 2013-04-15 ENCOUNTER — Other Ambulatory Visit: Payer: Self-pay

## 2013-04-15 DIAGNOSIS — M8580 Other specified disorders of bone density and structure, unspecified site: Secondary | ICD-10-CM

## 2013-04-15 MED ORDER — TRIAMTERENE-HCTZ 37.5-25 MG PO CAPS: 1.0000 | ORAL_CAPSULE | ORAL | Status: DC

## 2013-04-23 ENCOUNTER — Other Ambulatory Visit: Payer: Self-pay | Admitting: *Deleted

## 2013-04-23 DIAGNOSIS — M8580 Other specified disorders of bone density and structure, unspecified site: Secondary | ICD-10-CM

## 2013-05-04 ENCOUNTER — Telehealth: Payer: Self-pay | Admitting: Nurse Practitioner

## 2013-05-04 ENCOUNTER — Ambulatory Visit (INDEPENDENT_AMBULATORY_CARE_PROVIDER_SITE_OTHER): Payer: Medicare Other | Admitting: Internal Medicine

## 2013-05-04 ENCOUNTER — Encounter: Payer: Self-pay | Admitting: Internal Medicine

## 2013-05-04 VITALS — BP 126/80 | HR 88 | Wt 133.0 lb

## 2013-05-04 DIAGNOSIS — K219 Gastro-esophageal reflux disease without esophagitis: Secondary | ICD-10-CM | POA: Insufficient documentation

## 2013-05-04 MED ORDER — RANITIDINE HCL 150 MG PO TABS: 150.0000 mg | ORAL_TABLET | Freq: Two times a day (BID) | ORAL | Status: DC

## 2013-05-04 NOTE — Progress Notes (Signed)
  Subjective:    Patient ID: [name], female    DOB: 12-18-1945, 67 y.o.   MRN: [id number]  HPI   Symptoms began in May of this year without specific trigger. Her symptoms are manifested as a pressure substernally which she notes usually between 3-4 AM. Symptoms rarely occur during the day. TUMS didn't result in improvement.  She denies  triggers for reflux such as  excess stress; the "aspirin family" ; alcohol (1-2 / night); peppermint; and caffeine (coffee, tea, cola, and chocolate). Food & drink should avoided for @ least 2 hours before going to bed.     Review of Systems She denies  melena,  rectal bleeding, unexplained weight loss, dysphagia, or abdominal pain.   She is very physically active and denies any substernal chest discomfort or neck/arm pain with high level of activity.    Objective:   Physical Exam General appearance : thin but in  good health and nourishment w/o distress.  Eyes: No conjunctival inflammation or scleral icterus is present.  Oral exam: Dental hygiene is good; lips and gums are healthy appearing.There is no oropharyngeal erythema or exudate noted.   Heart:  Normal rate and regular rhythm. S1 and S2 normal without gallop, murmur, click, rub . S 4 w/o other extra sounds     Lungs:Chest clear to auscultation; no wheezes, rhonchi,rales ,or rubs present.No increased work of breathing.   Abdomen: bowel sounds normal, soft and non-tender without masses, organomegaly or hernias noted.  No guarding or rebound .Aorta palpable ; no AAA  Skin:Warm & dry.  Intact without suspicious lesions or rashes ; no jaundice or tenting  Lymphatic: No lymphadenopathy is noted about the head, neck, axilla             Assessment & Plan:  #1 GERD See plan

## 2013-05-04 NOTE — Patient Instructions (Addendum)
Reflux of gastric acid may be asymptomatic as this may occur mainly during sleep.The triggers for reflux  include stress; the "aspirin family" ; alcohol; peppermint; and caffeine (coffee, tea, cola, and chocolate). The aspirin family would include aspirin and the nonsteroidal agents such as ibuprofen &  Naproxen. Tylenol would not cause reflux. If having symptoms ; food & drink should be avoided for @ least 2 hours before going to bed.  

## 2013-05-05 NOTE — Telephone Encounter (Signed)
Patient is having a lot of prblem sleeping this has been going on for about 5 weeks. Seroquel 50 MG once at bedtime and Zanaflex 4 mg once at bedtime. Patient is still waking up and wants to know if you can alter her medications. Best call back during the day is [phone] (M) and at night will be her [phone] (H) please advise last seen CM on 03-12-2013

## 2013-05-06 MED ORDER — QUETIAPINE FUMARATE 25 MG PO TABS: 75.0000 mg | ORAL_TABLET | Freq: Every day | ORAL | Status: DC

## 2013-05-06 NOTE — Telephone Encounter (Signed)
TC to mobile number. Left message will increase Seroquel to 75mg  hs. Will have to take( 3) 25mg  tabs. Continue Zanaflex at same dose. Order faxed to pharmacy

## 2013-05-28 ENCOUNTER — Other Ambulatory Visit: Payer: Self-pay | Admitting: Family Medicine

## 2013-05-28 DIAGNOSIS — E041 Nontoxic single thyroid nodule: Secondary | ICD-10-CM

## 2013-06-03 ENCOUNTER — Ambulatory Visit
Admission: RE | Admit: 2013-06-03 | Discharge: 2013-06-03 | Disposition: A | Discharge status: Home / Self Care | Payer: Medicare Other | Source: Ambulatory Visit | Attending: Family Medicine | Admitting: Family Medicine

## 2013-06-03 DIAGNOSIS — E041 Nontoxic single thyroid nodule: Secondary | ICD-10-CM

## 2013-06-15 ENCOUNTER — Encounter: Payer: Self-pay | Admitting: Nurse Practitioner

## 2013-06-15 ENCOUNTER — Ambulatory Visit (INDEPENDENT_AMBULATORY_CARE_PROVIDER_SITE_OTHER): Payer: Medicare Other | Admitting: Nurse Practitioner

## 2013-06-15 VITALS — BP 118/68 | HR 78 | Temp 98.1°F | Ht 64.0 in | Wt 134.2 lb

## 2013-06-15 DIAGNOSIS — K219 Gastro-esophageal reflux disease without esophagitis: Secondary | ICD-10-CM

## 2013-06-15 DIAGNOSIS — R072 Precordial pain: Secondary | ICD-10-CM

## 2013-06-15 DIAGNOSIS — E041 Nontoxic single thyroid nodule: Secondary | ICD-10-CM

## 2013-06-15 MED ORDER — OMEPRAZOLE 40 MG PO CPDR: 40.0000 mg | DELAYED_RELEASE_CAPSULE | Freq: Every day | ORAL | Status: DC

## 2013-06-15 NOTE — Progress Notes (Signed)
Subjective:     [name] is an 67 y.o. female who presents for follow up of sternal chest pain that occurs day & night, worse at night, unrelieved by TUMS & ranitidine. She was started on ranitidine 1 month ago. Her symptoms are worsening-she took 20 tums last night due to discomfort. She has no associated symptoms such as SOB, nausea, palpitations, diaphoresis, vomiting, or diarhea. She is very active, exercises daily. She thinks symptoms have been present for 6 months.  The following portions of the patient's history were reviewed and updated as appropriate: allergies, current medications, past medical history, past surgical history and problem list.  Review of Systems Constitutional: negative for anorexia, fatigue, fevers, night sweats and weight loss Ears, nose, mouth, throat, and face: negative for hoarseness, nasal congestion, sore throat and voice change Respiratory: negative for cough, dyspnea on exertion, pleurisy/chest pain, sputum and wheezing Cardiovascular: negative for chest pain, fatigue, irregular heart beat, lower extremity edema and near-syncope Gastrointestinal: positive for dyspepsia, reflux symptoms and feels like needs to burp, feels bloated, negative for change in bowel habits, constipation, diarrhea, jaundice, nausea, odynophagia and vomiting   Objective:     BP 118/68  Pulse 78  Temp(Src) 98.1 F (36.7 C) (Oral)  Ht 5\' 4"  (1.626 m)  Wt 134 lb 3.2 oz (60.873 kg)  BMI 23.02 kg/m2  SpO2 97% General appearance: alert, cooperative, appears stated age and no distress Eyes: conjunctiva injected bilaterally Lungs: clear to auscultation bilaterally Heart: regular rate and rhythm, S1, S2 normal, no murmur, click, rub or gallop Abdomen: soft, non-tender; bowel sounds normal; no masses,  no organomegaly Extremities: extremities normal, atraumatic, no cyanosis or edema Pulses: 2+ and symmetric Joints: multiple pip & dip deformity. Denies pain. Has seen Pollyann Savoy, MD in past     Assessment:    Gastroesophageal Reflux Disease, DD: H. Pylori infection, PUD, cardiac r/t chest pain Office ECG appears normal-read as L atrial enlargement    Plan:   D/c zantac. Start omeprazole. F/u Dr. Alwyn Ren in 1 mo. Or sooner if symptoms worsen. Ref to cardiology to f/u abnormal ECG. See pt instructions.

## 2013-06-15 NOTE — Patient Instructions (Addendum)
Stop ranitidine. Start omeprazole. Take seroquel with a whole glass of water at least 1 hour before lying down. I am referring you to cardiology based on office ECG. Your symptoms are likely GI related but may be cardiac in nature. This office will call you with lab results (thyroid and H. Pylori). Dr Alwyn Ren will want to see you in about 1 month or sooner if your symptoms change or get worse. Pleasure to meet you!  Gastroesophageal Reflux Disease, Adult Gastroesophageal reflux disease (GERD) happens when acid from your stomach flows up into the esophagus. When acid comes in contact with the esophagus, the acid causes soreness (inflammation) in the esophagus. Over time, GERD may create small holes (ulcers) in the lining of the esophagus. CAUSES   Increased body weight. This puts pressure on the stomach, making acid rise from the stomach into the esophagus.  Smoking. This increases acid production in the stomach.  Drinking alcohol. This causes decreased pressure in the lower esophageal sphincter (valve or ring of muscle between the esophagus and stomach), allowing acid from the stomach into the esophagus.  Late evening meals and a full stomach. This increases pressure and acid production in the stomach.  A malformed lower esophageal sphincter. Sometimes, no cause is found. SYMPTOMS   Burning pain in the lower part of the mid-chest behind the breastbone and in the mid-stomach area. This may occur twice a week or more often.  Trouble swallowing.  Sore throat.  Dry cough.  Asthma-like symptoms including chest tightness, shortness of breath, or wheezing. DIAGNOSIS  Your caregiver may be able to diagnose GERD based on your symptoms. In some cases, X-rays and other tests may be done to check for complications or to check the condition of your stomach and esophagus. TREATMENT  Your caregiver may recommend over-the-counter or prescription medicines to help decrease acid production. Ask your  caregiver before starting or adding any new medicines.  HOME CARE INSTRUCTIONS   Change the factors that you can control. Ask your caregiver for guidance concerning weight loss, quitting smoking, and alcohol consumption.  Avoid foods and drinks that make your symptoms worse, such as:  Caffeine or alcoholic drinks.  Chocolate.  Peppermint or mint flavorings.  Garlic and onions.  Spicy foods.  Citrus fruits, such as oranges, lemons, or limes.  Tomato-based foods such as sauce, chili, salsa, and pizza.  Fried and fatty foods.  Avoid lying down for the 3 hours prior to your bedtime or prior to taking a nap.  Eat small, frequent meals instead of large meals.  Wear loose-fitting clothing. Do not wear anything tight around your waist that causes pressure on your stomach.  Raise the head of your bed 6 to 8 inches with wood blocks to help you sleep. Extra pillows will not help.  Only take over-the-counter or prescription medicines for pain, discomfort, or fever as directed by your caregiver.  Do not take aspirin, ibuprofen, or other nonsteroidal anti-inflammatory drugs (NSAIDs). SEEK IMMEDIATE MEDICAL CARE IF:   You have pain in your arms, neck, jaw, teeth, or back.  Your pain increases or changes in intensity or duration.  You develop nausea, vomiting, or sweating (diaphoresis).  You develop shortness of breath, or you faint.  Your vomit is green, yellow, black, or looks like coffee grounds or blood.  Your stool is red, bloody, or black. These symptoms could be signs of other problems, such as heart disease, gastric bleeding, or esophageal bleeding. MAKE SURE YOU:   Understand these instructions.  Will  watch your condition.  Will get help right away if you are not doing well or get worse. Document Released: 07/18/2005 Document Revised: 12/31/2011 Document Reviewed: 04/27/2011 [facility] Patient Information 2014 [place], [place].

## 2013-06-17 ENCOUNTER — Ambulatory Visit (INDEPENDENT_AMBULATORY_CARE_PROVIDER_SITE_OTHER): Payer: Medicare Other | Admitting: Cardiovascular Disease

## 2013-06-17 ENCOUNTER — Encounter: Payer: Self-pay | Admitting: Cardiovascular Disease

## 2013-06-17 VITALS — BP 131/78 | HR 72 | Ht 64.5 in | Wt 137.8 lb

## 2013-06-17 DIAGNOSIS — R9431 Abnormal electrocardiogram [ECG] [EKG]: Secondary | ICD-10-CM

## 2013-06-17 DIAGNOSIS — K219 Gastro-esophageal reflux disease without esophagitis: Secondary | ICD-10-CM

## 2013-06-17 DIAGNOSIS — R079 Chest pain, unspecified: Secondary | ICD-10-CM | POA: Insufficient documentation

## 2013-06-17 NOTE — Assessment & Plan Note (Signed)
Atypical sounds GI in nature. At night and with recumbancy.  Mild changes on ECG  F/U stress echo

## 2013-06-17 NOTE — Patient Instructions (Signed)
Your physician recommends that you schedule a follow-up appointment in:   AS NEEDED   Your physician recommends that you continue on your current medications as directed. Please refer to the Current Medication list given to you today.   Your physician has requested that you have a stress echocardiogram. For further information please visit [email or web address]. Please follow instruction sheet as given.  

## 2013-06-17 NOTE — Assessment & Plan Note (Signed)
Continue prilosec. I would refer to Dr Russella Dar for EGD

## 2013-06-17 NOTE — Progress Notes (Signed)
Patient ID: [name], female   DOB: 24-Apr-1946, 67 y.o.   MRN: [id number] [name] is an 67 y.o. female who is referred  For  sternal chest pain that occurs day & night, worse at night, unrelieved by TUMS & ranitidine. She was started on ranitidine 1 month ago. Her symptoms are worsening-she took 20 tums last night due to discomfort. She has no associated symptoms such as SOB, nausea, palpitations, diaphoresis, vomiting, or diarhea. She is very active, exercises daily. She thinks symptoms have been present for 6 months.  ESR and TSH normal  H pylori pending   Colonoscopy 5/14 normal   Pain is almost exclusively at night and worse in recumbancy.  She exercises and spins with no pain. Denies excess ETOH, mint, chocolate or carbs late at night. Recently changed from ranitidine to prilosec but still with symptoms  ROS: Denies fever, malais, weight loss, blurry vision, decreased visual acuity, cough, sputum, SOB, hemoptysis, pleuritic pain, palpitaitons, heartburn, abdominal pain, melena, lower extremity edema, claudication, or rash.  All other systems reviewed and negative   General: Affect appropriate Healthy:  appears stated age HEENT: normal Neck supple with no adenopathy JVP normal no bruits no thyromegaly Lungs clear with no wheezing and good diaphragmatic motion Heart:  S1/S2 no murmur,rub, gallop or click PMI normal Abdomen: benighn, BS positve, no tenderness, no AAA no bruit.  No HSM or HJR Distal pulses intact with no bruits No edema Neuro non-focal Skin warm and dry No muscular weakness  Medications Current Outpatient Prescriptions  Medication Sig Dispense Refill  . Calcium Carbonate-Vitamin D (CALCIUM + D PO) Take by mouth.      Marland Kitchen GLUCOSAMINE PO Take by mouth.      . Multiple Vitamin (MULTIVITAMIN) capsule Take 1 capsule by mouth daily.      . Omega-3 Fatty Acids (OMEGA 3 PO) Take by mouth.      Marland Kitchen omeprazole (PRILOSEC) 40 MG capsule Take 1 capsule (40 mg total) by  mouth daily.  30 capsule  1  . QUEtiapine (SEROQUEL) 25 MG tablet Take 25 mg by mouth at bedtime.      Marland Kitchen tiZANidine (ZANAFLEX) 4 MG tablet Take 1 tablet (4 mg total) by mouth at bedtime.  30 tablet  5  . triamterene-hydrochlorothiazide (DYAZIDE) 37.5-25 MG per capsule Take 1 each (1 capsule total) by mouth every morning.  90 capsule  3   No current facility-administered medications for this visit.    Allergies Codeine  Family History: Family History  Problem Relation Age of Onset  . Ovarian cancer Mother   . Stroke Sister 31  . Diabetes Paternal Grandmother   . Heart disease Neg Hx   . Hypertension Neg Hx   . Hyperlipidemia Neg Hx   . Colon cancer Neg Hx   . Alcohol abuse Father     Social History: History   Social History  . Marital Status: Married    Spouse Name: N/A    Number of Children: N/A  . Years of Education: N/A   Occupational History  . Not on file.   Social History Main Topics  . Smoking status: Never Smoker   . Smokeless tobacco: Never Used  . Alcohol Use: 2.0 oz/week    4 drink(s) per week  . Drug Use: No  . Sexual Activity: Yes    Birth Control/ Protection: Surgical     Comment: not much   Other Topics Concern  . Not on file   Social History Narrative  .  No narrative on file    Electrocardiogram:  SR rate 72  Nonspecific ST/T wave changes QT 479    Assessment and Plan

## 2013-06-19 ENCOUNTER — Telehealth: Payer: Self-pay | Admitting: Nurse Practitioner

## 2013-06-19 DIAGNOSIS — K219 Gastro-esophageal reflux disease without esophagitis: Secondary | ICD-10-CM

## 2013-06-19 NOTE — Telephone Encounter (Signed)
H Pylori antibody tests are negative for acute or remote infection. I reviewed cardiology notes. Pt has stress test pending, Dr. Eden Emms thinks pain is GI in nature. Will ref to GI. Have not discussed w/pt. LM at her home to ret call.

## 2013-06-30 ENCOUNTER — Encounter: Payer: Self-pay | Admitting: Gastroenterology

## 2013-07-01 ENCOUNTER — Other Ambulatory Visit: Payer: Self-pay | Admitting: Neurology

## 2013-07-06 NOTE — Progress Notes (Signed)
This lab resulted from [facility], it was sent for scanning. Result was negative for IgG, IgA, & IgM. Pt notified.

## 2013-07-08 ENCOUNTER — Encounter: Payer: Self-pay | Admitting: Nurse Practitioner

## 2013-07-08 ENCOUNTER — Ambulatory Visit (HOSPITAL_BASED_OUTPATIENT_CLINIC_OR_DEPARTMENT_OTHER): Payer: Medicare Other

## 2013-07-08 ENCOUNTER — Ambulatory Visit (HOSPITAL_COMMUNITY): Payer: Medicare Other | Attending: Cardiology | Admitting: Radiology

## 2013-07-08 DIAGNOSIS — R9431 Abnormal electrocardiogram [ECG] [EKG]: Secondary | ICD-10-CM | POA: Insufficient documentation

## 2013-07-08 DIAGNOSIS — R0989 Other specified symptoms and signs involving the circulatory and respiratory systems: Secondary | ICD-10-CM

## 2013-07-08 DIAGNOSIS — R072 Precordial pain: Secondary | ICD-10-CM

## 2013-07-08 DIAGNOSIS — R079 Chest pain, unspecified: Secondary | ICD-10-CM | POA: Insufficient documentation

## 2013-07-08 NOTE — Progress Notes (Signed)
Stress Echocardiogram performed.  

## 2013-07-20 ENCOUNTER — Encounter: Payer: Self-pay | Admitting: Gastroenterology

## 2013-07-20 ENCOUNTER — Ambulatory Visit (INDEPENDENT_AMBULATORY_CARE_PROVIDER_SITE_OTHER): Payer: Medicare Other | Admitting: Gastroenterology

## 2013-07-20 VITALS — BP 128/80 | HR 84 | Ht 63.75 in | Wt 133.5 lb

## 2013-07-20 DIAGNOSIS — K219 Gastro-esophageal reflux disease without esophagitis: Secondary | ICD-10-CM

## 2013-07-20 DIAGNOSIS — R079 Chest pain, unspecified: Secondary | ICD-10-CM

## 2013-07-20 MED ORDER — OMEPRAZOLE 40 MG PO CPDR: 40.0000 mg | DELAYED_RELEASE_CAPSULE | Freq: Two times a day (BID) | ORAL | Status: DC

## 2013-07-20 NOTE — Progress Notes (Signed)
History of Present Illness: This is a 67 year old female who complains of pain in her upper chest and generally occurs between 3 and 4 AM at night and awakens her from sleep. Her symptoms have been present for 2-3 months. She denies any prior GI problems. She denies regurgitation or choking. She does not have typical postprandial heartburn or belching. He was placed on omeprazole 40 mg daily with no change in symptoms. She does temporary relief with TUMS when used in the middle the night. She occasionally notes a slight tightness in her throat and the symptoms have not changed on omeprazole. Denies weight loss, abdominal pain, constipation, diarrhea, change in stool caliber, melena, hematochezia, nausea, vomiting, dysphagia, reflux symptoms,.  Current Medications, Allergies, Past Medical History, Past Surgical History, Family History and Social History were reviewed in [employer] record.  Physical Exam: General: Well developed , well nourished, no acute distress Head: Normocephalic and atraumatic Eyes:  sclerae anicteric, EOMI Ears: Normal auditory acuity Mouth: No deformity or lesions Lungs: Clear throughout to auscultation, no chest wall or neck tenderness Heart: Regular rate and rhythm; no murmurs, rubs or bruits Abdomen: Soft, non tender and non distended. No masses, hepatosplenomegaly or hernias noted. Normal Bowel sounds Rectal: deferred, normal colonoscopy in May 2014 Musculoskeletal: Symmetrical with no gross deformities  Pulses:  Normal pulses noted Extremities: No clubbing, cyanosis, edema or deformities noted Neurological: Alert oriented x 4, grossly nonfocal Psychological:  Alert and cooperative. Normal mood and affect  Assessment and Recommendations:  1. Chest pain. Not entirely typical for GERD but need to exclude. Not responding to omeprazole but appears improved with the use of TUMS. Increase omeprazole to 40 mg po bid and antireflux measures. Continue  TUMS as needed. The risks, benefits, and alternatives to endoscopy with possible biopsy and possible dilation were discussed with the patient and they consent to proceed.

## 2013-07-20 NOTE — Patient Instructions (Addendum)
Increase your omeprazole to one tablet by mouth twice daily before breakfast and dinner. A new prescription has been sent to the pharmacy.   You have been scheduled for an endoscopy with propofol. Please follow written instructions given to you at your visit today. If you use inhalers (even only as needed), please bring them with you on the day of your procedure. Your physician has requested that you go to www.startemmi.com and enter the access code given to you at your visit today. This web site gives a general overview about your procedure. However, you should still follow specific instructions given to you by our office regarding your preparation for the procedure.  Patient advised to avoid spicy, acidic, citrus, chocolate, mints, fruit and fruit juices.  Limit the intake of caffeine, alcohol and Soda.  Don't exercise too soon after eating.  Don't lie down within 3-4 hours of eating.  Elevate the head of your bed.  Thank you for choosing me and [facility].  Venita Lick. Pleas Koch., MD., [facility]

## 2013-07-21 ENCOUNTER — Encounter: Payer: Self-pay | Admitting: Gastroenterology

## 2013-07-21 ENCOUNTER — Ambulatory Visit (AMBULATORY_SURGERY_CENTER): Payer: Medicare Other | Admitting: Gastroenterology

## 2013-07-21 VITALS — BP 117/62 | HR 75 | Temp 98.1°F | Resp 15 | Ht 63.0 in | Wt 133.0 lb

## 2013-07-21 DIAGNOSIS — D131 Benign neoplasm of stomach: Secondary | ICD-10-CM

## 2013-07-21 DIAGNOSIS — K219 Gastro-esophageal reflux disease without esophagitis: Secondary | ICD-10-CM

## 2013-07-21 DIAGNOSIS — R079 Chest pain, unspecified: Secondary | ICD-10-CM

## 2013-07-21 DIAGNOSIS — K2970 Gastritis, unspecified, without bleeding: Secondary | ICD-10-CM

## 2013-07-21 MED ORDER — SODIUM CHLORIDE 0.9 % IV SOLN: 500.0000 mL | INTRAVENOUS | Status: DC

## 2013-07-21 NOTE — Patient Instructions (Addendum)

## 2013-07-21 NOTE — Progress Notes (Addendum)
Patient did not have preoperative order for IV antibiotic SSI prophylaxis. (G8918)  Patient did not experience any of the following events: a burn prior to discharge; a fall within the facility; wrong site/side/patient/procedure/implant event; or a hospital transfer or hospital admission upon discharge from the facility. (G8907)  

## 2013-07-21 NOTE — Progress Notes (Signed)
Called to room to assist during endoscopic procedure.  Patient ID and intended procedure confirmed with present staff. Received instructions for my participation in the procedure from the performing physician.  

## 2013-07-21 NOTE — Op Note (Signed)
[facility] [address] [place], [address]   ENDOSCOPY PROCEDURE REPORT  PATIENT: [name], [name]  MR#: [id number] BIRTHDATE: [birth year] , 67  yrs. old GENDER: Female ENDOSCOPIST: Meryl Dare, MD, [facility] REFERRED BY: PROCEDURE DATE:  07/21/2013 PROCEDURE:  EGD w/ biopsy ASA CLASS:     Class II INDICATIONS:  Chest pain.   history of esophageal reflux. MEDICATIONS: MAC sedation, administered by CRNA and propofol (Diprivan) 200mg  IV TOPICAL ANESTHETIC: Cetacaine Spray DESCRIPTION OF PROCEDURE: After the risks benefits and alternatives of the procedure were thoroughly explained, informed consent was obtained.  The LB [id number] endoscope was introduced through the mouth and advanced to the second portion of the duodenum  without limitations.  The instrument was slowly withdrawn as the mucosa was fully examined.  ESOPHAGUS: A variable Z-line was observed.  Multiple biopsies were performed.   The esophagus was otherwise normal. STOMACH: Mild gastritis was found in the gastric body, and gastric fundus.  Multiple biopsies were performed.   The stomach otherwise appeared normal. DUODENUM: The duodenal mucosa showed no abnormalities.  Retroflexed views revealed a small hiatal hernia.  The scope was then withdrawn from the patient and the procedure completed.  COMPLICATIONS: There were no complications.  ENDOSCOPIC IMPRESSION: 1.   Variable Z-line was observed; multiple biopsies 2.   Small hiatal hernia 3.   Gastritis in the gastric body, and gastric fundus; multiple biopsies  RECOMMENDATIONS: 1.  Anti-reflux regimen 2.  Await pathology results 3.  PPI bid 4.  Call office to schedule an office appointment for about 6 weeks   eSigned:  Meryl Dare, MD, [facility] 07/21/2013 8:39 AM

## 2013-07-22 ENCOUNTER — Telehealth: Payer: Self-pay | Admitting: *Deleted

## 2013-07-22 NOTE — Telephone Encounter (Signed)
  Follow up Call-  Call back number 07/21/2013 03/10/2013  Post procedure Call Back phone  # [phone]  Permission to leave phone message Yes Yes     Patient questions:  Do you have a fever, pain , or abdominal swelling? no Pain Score  0 *  Have you tolerated food without any problems? yes  Have you been able to return to your normal activities? yes  Do you have any questions about your discharge instructions: Diet   no Medications  no Follow up visit  no  Do you have questions or concerns about your Care? no  Actions: * If pain score is 4 or above: No action needed, pain <4.

## 2013-07-30 ENCOUNTER — Encounter: Payer: Self-pay | Admitting: Gastroenterology

## 2013-08-27 ENCOUNTER — Other Ambulatory Visit: Payer: Self-pay

## 2013-08-31 ENCOUNTER — Ambulatory Visit (INDEPENDENT_AMBULATORY_CARE_PROVIDER_SITE_OTHER): Payer: Medicare Other | Admitting: Gastroenterology

## 2013-08-31 ENCOUNTER — Encounter: Payer: Self-pay | Admitting: Gastroenterology

## 2013-08-31 VITALS — BP 118/64 | HR 72 | Ht 63.0 in | Wt 135.0 lb

## 2013-08-31 DIAGNOSIS — K219 Gastro-esophageal reflux disease without esophagitis: Secondary | ICD-10-CM

## 2013-08-31 NOTE — Progress Notes (Signed)
    History of Present Illness: This is a 67 year old female with GERD. She recently underwent upper endoscopy. I reviewed the findings with her. Her symptoms have improved on omeprazole 40 mg twice a day but she still has occasional nocturnal and daytime breakthrough symptoms don't respond well to TUMS.   Current Medications, Allergies, Past Medical History, Past Surgical History, Family History and Social History were reviewed in [employer] record.  Physical Exam: General: Well developed , well nourished, no acute distress Head: Normocephalic and atraumatic Eyes:  sclerae anicteric, EOMI Ears: Normal auditory acuity Mouth: No deformity or lesions Lungs: Clear throughout to auscultation Heart: Regular rate and rhythm; no murmurs, rubs or bruits Abdomen: Soft, non tender and non distended. No masses, hepatosplenomegaly or hernias noted. Normal Bowel sounds Musculoskeletal: Symmetrical with no gross deformities  Extremities: No clubbing, cyanosis, edema or deformities noted Neurological: Alert oriented x 4, grossly nonfocal Psychological:  Alert and cooperative. Normal mood and affect  Assessment and Recommendations:  1. GERD. Follow antireflux measures. Continue omeprazole 40 mg twice a day. Begin ranitidine 150 mg at bedtime. TUMS prn. Contact us if her symptoms do not remain under good control.

## 2013-08-31 NOTE — Patient Instructions (Signed)
Start taking over the counter Zantac 150 mg one tablet by mouth at bedtime in addition to your medications you already taking for acid reflux.   Please call back if this does not adequately resolve your symptoms.   Thank you for choosing me and [facility].  Venita Lick. Pleas Koch., MD., [facility]

## 2013-09-09 ENCOUNTER — Telehealth: Payer: Self-pay | Admitting: Neurology

## 2013-09-09 ENCOUNTER — Ambulatory Visit: Payer: Medicare Other | Admitting: Neurology

## 2013-09-10 MED ORDER — TIZANIDINE HCL 4 MG PO TABS: 4.0000 mg | ORAL_TABLET | Freq: Every day | ORAL | Status: DC

## 2013-09-10 MED ORDER — QUETIAPINE FUMARATE 50 MG PO TABS: 50.0000 mg | ORAL_TABLET | Freq: Every day | ORAL | Status: DC
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
## 2013-09-23 ENCOUNTER — Encounter: Payer: Self-pay | Admitting: Neurology

## 2013-09-23 ENCOUNTER — Ambulatory Visit (INDEPENDENT_AMBULATORY_CARE_PROVIDER_SITE_OTHER): Payer: Medicare Other | Admitting: Neurology

## 2013-09-23 VITALS — BP 123/74 | HR 83 | Resp 16 | Ht 64.0 in | Wt 134.0 lb

## 2013-09-23 DIAGNOSIS — F409 Phobic anxiety disorder, unspecified: Secondary | ICD-10-CM | POA: Insufficient documentation

## 2013-09-23 DIAGNOSIS — M542 Cervicalgia: Secondary | ICD-10-CM

## 2013-09-23 DIAGNOSIS — G4700 Insomnia, unspecified: Secondary | ICD-10-CM

## 2013-09-23 DIAGNOSIS — Z79899 Other long term (current) drug therapy: Secondary | ICD-10-CM

## 2013-09-23 MED ORDER — TIZANIDINE HCL 4 MG PO TABS: 4.0000 mg | ORAL_TABLET | Freq: Every day | ORAL | Status: DC

## 2013-09-23 MED ORDER — QUETIAPINE FUMARATE 50 MG PO TABS: 50.0000 mg | ORAL_TABLET | Freq: Every day | ORAL | Status: DC

## 2013-09-23 NOTE — Patient Instructions (Signed)
Insomnia Insomnia means you have trouble falling or staying asleep. It affects about one person in three at different times and is usually related to stress from work, school, or personal relations. Insomnia is also a sign of depression or anxiety. Other medical problems that cause insomnia include conditions that cause pain, night leg cramps, coughing, shortness of breath, urinary problems, and fevers. Sleep apnea is an abnormal breathing pattern at night that can cause insomnia and loud snoring. Certain medications and excess intake of caffeine drinks (coffee, tea, colas) can also interfere with normal sleep. Treatment for insomnia depends on the cause. Besides specific medical treatment, the following measures can help you relax and get better sleep. Get regular exercise every day, at least several hours before bed time. Try to get to bed at the same time every night. Take a hot bath before retiring to help you relax. Do not stay in bed if you are unable to sleep. During the daytime avoid staying in bed to watch television, eat, or read. Reduce unwanted noise and light in your room. Keep your room at a comfortable temperature. Avoid alcohol as it causes one to sleep less soundly, may cause you to awaken during the night, and can leave you feeling groggy the next day. Using a mild sedative prescribed or suggested by your caregiver may be needed, but the daily use of sleeping pills is not recommended. Anti-depressant medicines can improve sleep in people with depression. Please call your doctor for follow up care to better understand the cause and proper treatment of your insomnia. Document Released: 11/15/2004 Document Revised: 12/31/2011 Document Reviewed: 10/08/2005 [employer] Patient Information 2014 [employer].  

## 2013-09-23 NOTE — Progress Notes (Signed)
Chief Complaint  Patient presents with  . Insomnia    Follow-Up Visit     HPI: [name] has been a patient of our practice for close to 4 years. She was referred by Dr. Eda Paschal.   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 Also the  standard blood test for Rheumatoid arthritis was negative,  the patient has visible joint disfiguration .  She had developed chronic insomnia over many years but has done well on several [place] she has no history of nocturnal pain waking her she's not short of breath and she denied snoring or witnessed apneas he continues to exercise and maintain a normal  body mass index.  She continued to have problems initiating sleep and maintaining sleep intermittently but not on medications. In addition she took Flexeril for many years , now Tizanidine has helped f her symptoms of cervical spine disease and it also helps her to sleep.   She has a history of chronic insomnia and is currently on Seroquel doing well on that medication.  she continues to exercise daily.  She has no new neurologic complaints. the last year received a letter from [facility] care stating that this medication was not longer covered.  We have discussed replacing the medication with Tizanidine.  ROS:  Grip stregth loss from arthritic metacarpal and interdigital joints, no  Ataxia , dysmetria, no sensory loss, no gait disturbance. Insomnia when not using Seroquel.                                Physical Exam General: well developed, well nourished, seated, in no evident distress Head: head normocephalic and atraumatic. Oropharynx benign Neck: supple with no carotid or supraclavicular bruits Cardiovascular: regular rate and rhythm, no murmurs Musculoskeletal skeletal changes in the hands, all 10  Fingers, both  shoulders and the knees- crepitation.   Neurologic  Exam Mental Status: Awake and fully alert. Oriented to place and time. Follows all commands  Mood and affect appropriate.  Cranial Nerves:Pupils equal, briskly reactive to light. Extraocular movements full without nystagmus. Visual fields full to confrontation. Hearing intact and symmetric to finger snap. Facial sensation intact. Face, tongue, palate move normally and symmetrically. Neck flexion and extension normal.  Motor: Normal bulk and tone. Normal strength in all tested extremity muscles. No focal weakness Sensory.: intact to touch and pinprick and vibratory.  Coordination: Rapid alternating movements normal in all extremities. Finger-to-nose and heel-to-shin performed accurately bilaterally. Gait and Station: Arises from chair without difficulty. Stance is normal. Gait demonstrates normal stride length and balance . Able to heel, toe and tandem walk without difficulty.  Reflexes: 1+ and symmetric. Toes downgoing.     ASSESSMENT:  1)Chronic insomnia   Doing well on Seroquel Degenerative cervical disease doing well on Tizanidine.      PLAN: Continue Tizanidine 4mg  every night-  will refill Continue Seroquel  25 mg nightly , will refill F/U 12 months

## 2013-09-24 LAB — COMPREHENSIVE METABOLIC PANEL
Albumin/Globulin Ratio: 1.8 (ref 1.1–2.5)
Albumin: 4.7 g/dL (ref 3.6–4.8)
Alkaline Phosphatase: 55 IU/L (ref 39–117)
BUN/Creatinine Ratio: 31 — ABNORMAL HIGH (ref 11–26)
BUN: 25 mg/dL (ref 8–27)
Calcium: 9.9 mg/dL (ref 8.6–10.2)
Chloride: 101 mmol/L (ref 96–108)
GFR calc Af Amer: 88 mL/min/{1.73_m2} (ref >59)
GFR calc non Af Amer: 77 mL/min/{1.73_m2} (ref >59)
Globulin, Total: 2.6 g/dL (ref 1.5–4.5)
Glucose: 99 mg/dL (ref 65–99)
Potassium: 3.7 mmol/L (ref 3.5–5.2)
Total Bilirubin: 0.5 mg/dL (ref 0.0–1.2)
Total Protein: 7.3 g/dL (ref 6.0–8.5)

## 2013-10-07 ENCOUNTER — Ambulatory Visit: Payer: Medicare Other | Admitting: Neurology

## 2013-11-24 ENCOUNTER — Other Ambulatory Visit: Payer: Self-pay | Admitting: Nurse Practitioner

## 2013-11-24 NOTE — Telephone Encounter (Signed)
Per notes:  Dennie Bible, NP at 05/06/2013 12:27 PM     Status: Signed        TC to mobile number. Left message will increase Seroquel to 75mg  hs. Will have to take( 3) 25mg  tabs.

## 2013-12-10 ENCOUNTER — Encounter: Payer: Self-pay | Admitting: Women's Health

## 2014-01-18 ENCOUNTER — Other Ambulatory Visit: Payer: Self-pay | Admitting: Gastroenterology

## 2014-01-19 ENCOUNTER — Encounter: Payer: Self-pay | Admitting: Women's Health

## 2014-01-19 ENCOUNTER — Ambulatory Visit (INDEPENDENT_AMBULATORY_CARE_PROVIDER_SITE_OTHER): Payer: Medicare Other | Admitting: Women's Health

## 2014-01-19 VITALS — BP 136/90 | Ht 63.5 in | Wt 134.0 lb

## 2014-01-19 DIAGNOSIS — M899 Disorder of bone, unspecified: Secondary | ICD-10-CM

## 2014-01-19 DIAGNOSIS — M949 Disorder of cartilage, unspecified: Secondary | ICD-10-CM

## 2014-01-19 DIAGNOSIS — M8580 Other specified disorders of bone density and structure, unspecified site: Secondary | ICD-10-CM

## 2014-01-19 MED ORDER — TRIAMTERENE-HCTZ 37.5-25 MG PO CAPS: 1.0000 | ORAL_CAPSULE | ORAL | Status: DC

## 2014-01-19 NOTE — Progress Notes (Signed)
MARIAELENA CADE Nov 16, 1945 [id number]    History:    Presents for breast and pelvic exam. TAH with BSO for endometriosis. Normal Pap and mammogram history. Osteopenia DEXA 2014 T score -1.6 right hip stable FRAX 11.8%/1.6%. Spills calcium in the urine is on Dyazide daily has seen Dr. Titus Dubin at [facility] for this problem. Current on vaccinations. Negative colonoscopy 2014.  Past medical history, past surgical history, family history and social history were all reviewed and documented in the EPIC chart. Retired Education officer, museum. Mother died of ovarian cancer. Healthy lifestyle works with a Clinical research associate. Stressed from a mentally ill. sister who lives in an assisted-living facility in [place].  Exam:  Filed Vitals:   01/19/14 1428  BP: 136/90    General appearance:  Normal Thyroid:  Symmetrical, normal in size, without palpable masses or nodularity. Respiratory  Auscultation:  Clear without wheezing or rhonchi Cardiovascular  Auscultation:  Regular rate, without rubs, murmurs or gallops  Edema/varicosities:  Not grossly evident Abdominal  Soft,nontender, without masses, guarding or rebound.  Liver/spleen:  No organomegaly noted  Hernia:  None appreciated  Skin  Inspection:  Grossly normal   Breasts: Examined lying and sitting.     Right: Without masses, retractions, discharge or axillary adenopathy.     Left: Without masses, retractions, discharge or axillary adenopathy. Gentitourinary   Inguinal/mons:  Normal without inguinal adenopathy  External genitalia:  Normal  BUS/Urethra/Skene's glands:  Normal  Vagina:  Normal  Cervix:  Absent Uterus:  Absent  Adnexa/parametria:     Rt: Without masses or tenderness.   Lt: Without masses or tenderness.  Anus and perineum: Normal  Digital rectal exam: Normal sphincter tone without palpated masses or tenderness  Assessment/Plan:  68 y.o. MWF  for breast and pelvic exam.  TAH with BSO for endometriosis on no HRT Arthritis Osteopenia on Dyazide  prescription due to excreting calcium Primary care manages labs and meds  Plan: SBE's, continue annual mammogram with 3D tomography history of dense breast. Continue regular exercise, calcium rich diet, vitamin D 2000 daily healthy lifestyle. Dyazide 37.5 prescription, proper use given and reviewed. Followup with Dr. Titus Dubin, sees every other year now. Pap normal 2012, new screening guidelines reviewed.   [place], 5:15 PM 01/19/2014

## 2014-01-19 NOTE — Patient Instructions (Signed)
Health Recommendations for Postmenopausal Women Respected and ongoing research has looked at the most common causes of death, disability, and poor quality of life in postmenopausal women. The causes include heart disease, diseases of blood vessels, diabetes, depression, cancer, and bone loss (osteoporosis). Many things can be done to help lower the chances of developing these and other common problems: CARDIOVASCULAR DISEASE Heart Disease: A heart attack is a medical emergency. Know the signs and symptoms of a heart attack. Below are things women can do to reduce their risk for heart disease.   Do not smoke. If you smoke, quit.  Aim for a healthy weight. Being overweight causes many preventable deaths. Eat a healthy and balanced diet and drink an adequate amount of liquids.  Get moving. Make a commitment to be more physically active. Aim for 30 minutes of activity on most, if not all days of the week.  Eat for heart health. Choose a diet that is low in saturated fat and cholesterol and eliminate trans fat. Include whole grains, vegetables, and fruits. Read and understand the labels on food containers before buying.  Know your numbers. Ask your caregiver to check your blood pressure, cholesterol (total, HDL, LDL, triglycerides) and blood glucose. Work with your caregiver on improving your entire clinical picture.  High blood pressure. Limit or stop your table salt intake (try salt substitute and food seasonings). Avoid salty foods and drinks. Read labels on food containers before buying. Eating well and exercising can help control high blood pressure. STROKE  Stroke is a medical emergency. Stroke may be the result of a blood clot in a blood vessel in the brain or by a brain hemorrhage (bleeding). Know the signs and symptoms of a stroke. To lower the risk of developing a stroke:  Avoid fatty foods.  Quit smoking.  Control your diabetes, blood pressure, and irregular heart rate. THROMBOPHLEBITIS  (BLOOD CLOT) OF THE LEG  Becoming overweight and leading a stationary lifestyle may also contribute to developing blood clots. Controlling your diet and exercising will help lower the risk of developing blood clots. CANCER SCREENING  Breast Cancer: Take steps to reduce your risk of breast cancer.  You should practice "breast self-awareness." This means understanding the normal appearance and feel of your breasts and should include breast self-examination. Any changes detected, no matter how small, should be reported to your caregiver.  After age 40, you should have a clinical breast exam (CBE) every year.  Starting at age 40, you should consider having a mammogram (breast X-ray) every year.  If you have a family history of breast cancer, talk to your caregiver about genetic screening.  If you are at high risk for breast cancer, talk to your caregiver about having an MRI and a mammogram every year.  Intestinal or Stomach Cancer: Tests to consider are a rectal exam, fecal occult blood, sigmoidoscopy, and colonoscopy. Women who are high risk may need to be screened at an earlier age and more often.  Cervical Cancer:  Beginning at age 30, you should have a Pap test every 3 years as long as the past 3 Pap tests have been normal.  If you have had past treatment for cervical cancer or a condition that could lead to cancer, you need Pap tests and screening for cancer for at least 20 years after your treatment.  If you had a hysterectomy for a problem that was not cancer or a condition that could lead to cancer, then you no longer need Pap tests.    If you are between ages 65 and 70, and you have had normal Pap tests going back 10 years, you no longer need Pap tests.  If Pap tests have been discontinued, risk factors (such as a new sexual partner) need to be reassessed to determine if screening should be resumed.  Some medical problems can increase the chance of getting cervical cancer. In these  cases, your caregiver may recommend more frequent screening and Pap tests.  Uterine Cancer: If you have vaginal bleeding after reaching menopause, you should notify your caregiver.  Ovarian cancer: Other than yearly pelvic exams, there are no reliable tests available to screen for ovarian cancer at this time except for yearly pelvic exams.  Lung Cancer: Yearly chest X-rays can detect lung cancer and should be done on high risk women, such as cigarette smokers and women with chronic lung disease (emphysema).  Skin Cancer: A complete body skin exam should be done at your yearly examination. Avoid overexposure to the sun and ultraviolet light lamps. Use a strong sun block cream when in the sun. All of these things are important in lowering the risk of skin cancer. MENOPAUSE Menopause Symptoms: Hormone therapy products are effective for treating symptoms associated with menopause:  Moderate to severe hot flashes.  Night sweats.  Mood swings.  Headaches.  Tiredness.  Loss of sex drive.  Insomnia.  Other symptoms. Hormone replacement carries certain risks, especially in older women. Women who use or are thinking about using estrogen or estrogen with progestin treatments should discuss that with their caregiver. Your caregiver will help you understand the benefits and risks. The ideal dose of hormone replacement therapy is not known. The Food and Drug Administration (FDA) has concluded that hormone therapy should be used only at the lowest doses and for the shortest amount of time to reach treatment goals.  OSTEOPOROSIS Protecting Against Bone Loss and Preventing Fracture: If you use hormone therapy for prevention of bone loss (osteoporosis), the risks for bone loss must outweigh the risk of the therapy. Ask your caregiver about other medications known to be safe and effective for preventing bone loss and fractures. To guard against bone loss or fractures, the following is recommended:  If  you are less than age 50, take 1000 mg of calcium and at least 600 mg of Vitamin D per day.  If you are greater than age 50 but less than age 70, take 1200 mg of calcium and at least 600 mg of Vitamin D per day.  If you are greater than age 70, take 1200 mg of calcium and at least 800 mg of Vitamin D per day. Smoking and excessive alcohol intake increases the risk of osteoporosis. Eat foods rich in calcium and vitamin D and do weight bearing exercises several times a week as your caregiver suggests. DIABETES Diabetes Melitus: If you have Type I or Type 2 diabetes, you should keep your blood sugar under control with diet, exercise and recommended medication. Avoid too many sweets, starchy and fatty foods. Being overweight can make control more difficult. COGNITION AND MEMORY Cognition and Memory: Menopausal hormone therapy is not recommended for the prevention of cognitive disorders such as Alzheimer's disease or memory loss.  DEPRESSION  Depression may occur at any age, but is common in elderly women. The reasons may be because of physical, medical, social (loneliness), or financial problems and needs. If you are experiencing depression because of medical problems and control of symptoms, talk to your caregiver about this. Physical activity and   exercise may help with mood and sleep. Community and volunteer involvement may help your sense of value and worth. If you have depression and you feel that the problem is getting worse or becoming severe, talk to your caregiver about treatment options that are best for you. ACCIDENTS  Accidents are common and can be serious in the elderly woman. Prepare your house to prevent accidents. Eliminate throw rugs, place hand bars in the bath, shower and toilet areas. Avoid wearing high heeled shoes or walking on wet, snowy, and icy areas. Limit or stop driving if you have vision or hearing problems, or you feel you are unsteady with you movements and  reflexes. HEPATITIS C Hepatitis C is a type of viral infection affecting the liver. It is spread mainly through contact with blood from an infected person. It can be treated, but if left untreated, it can lead to severe liver damage over years. Many people who are infected do not know that the virus is in their blood. If you are a "baby-boomer", it is recommended that you have one screening test for Hepatitis C. IMMUNIZATIONS  Several immunizations are important to consider having during your senior years, including:   Tetanus, diptheria, and pertussis booster shot.  Influenza every year before the flu season begins.  Pneumonia vaccine.  Shingles vaccine.  Others as indicated based on your specific needs. Talk to your caregiver about these. Document Released: 11/30/2005 Document Revised: 09/24/2012 Document Reviewed: 07/26/2008 ExitCare Patient Information 2014 [employer].  

## 2014-02-09 ENCOUNTER — Encounter: Payer: Self-pay | Admitting: Internal Medicine

## 2014-02-09 ENCOUNTER — Other Ambulatory Visit (INDEPENDENT_AMBULATORY_CARE_PROVIDER_SITE_OTHER): Payer: Medicare Other

## 2014-02-09 ENCOUNTER — Ambulatory Visit (INDEPENDENT_AMBULATORY_CARE_PROVIDER_SITE_OTHER): Payer: Medicare Other | Admitting: Internal Medicine

## 2014-02-09 VITALS — BP 118/80 | HR 82 | Temp 98.0°F | Ht 64.0 in | Wt 133.0 lb

## 2014-02-09 DIAGNOSIS — R7402 Elevation of levels of lactic acid dehydrogenase (LDH): Secondary | ICD-10-CM | POA: Insufficient documentation

## 2014-02-09 DIAGNOSIS — M949 Disorder of cartilage, unspecified: Secondary | ICD-10-CM

## 2014-02-09 DIAGNOSIS — M899 Disorder of bone, unspecified: Secondary | ICD-10-CM

## 2014-02-09 DIAGNOSIS — K219 Gastro-esophageal reflux disease without esophagitis: Secondary | ICD-10-CM

## 2014-02-09 DIAGNOSIS — M8580 Other specified disorders of bone density and structure, unspecified site: Secondary | ICD-10-CM

## 2014-02-09 DIAGNOSIS — R7401 Elevation of levels of liver transaminase levels: Secondary | ICD-10-CM

## 2014-02-09 DIAGNOSIS — R740 Nonspecific elevation of levels of transaminase and lactic acid dehydrogenase [LDH]: Principal | ICD-10-CM

## 2014-02-09 LAB — HEPATIC FUNCTION PANEL
ALBUMIN: 4.3 g/dL (ref 3.5–5.2)
ALT: 24 U/L (ref 0–35)
AST: 17 U/L (ref 0–37)
Alkaline Phosphatase: 51 U/L (ref 39–117)
Bilirubin, Direct: 0.1 mg/dL (ref 0.0–0.3)
Total Bilirubin: 0.9 mg/dL (ref 0.3–1.2)
Total Protein: 7.1 g/dL (ref 6.0–8.3)

## 2014-02-09 LAB — CBC WITH DIFFERENTIAL/PLATELET
Basophils Absolute: 0 10*3/uL (ref 0.0–0.1)
Basophils Relative: 0.4 % (ref 0.0–3.0)
EOS ABS: 0.1 10*3/uL (ref 0.0–0.7)
Eosinophils Relative: 2.6 % (ref 0.0–5.0)
HCT: 40.5 % (ref 36.0–46.0)
HEMOGLOBIN: 13.9 g/dL (ref 12.0–15.0)
Lymphocytes Relative: 29.2 % (ref 12.0–46.0)
Lymphs Abs: 1.6 10*3/uL (ref 0.7–4.0)
MCHC: 34.4 g/dL (ref 30.0–36.0)
MCV: 96.4 fl (ref 78.0–100.0)
Monocytes Absolute: 0.5 10*3/uL (ref 0.1–1.0)
Monocytes Relative: 8.8 % (ref 3.0–12.0)
NEUTROS ABS: 3.2 10*3/uL (ref 1.4–7.7)
Neutrophils Relative %: 59 % (ref 43.0–77.0)
Platelets: 231 10*3/uL (ref 150.0–400.0)
RBC: 4.2 Mil/uL (ref 3.87–5.11)
RDW: 13 % (ref 11.5–14.6)
WBC: 5.4 10*3/uL (ref 4.5–10.5)

## 2014-02-09 LAB — BASIC METABOLIC PANEL
BUN: 17 mg/dL (ref 6–23)
CALCIUM: 9.9 mg/dL (ref 8.4–10.5)
CO2: 29 meq/L (ref 19–32)
Chloride: 100 mEq/L (ref 96–112)
Creatinine, Ser: 0.7 mg/dL (ref 0.4–1.2)
GFR: 94.73 mL/min (ref >60.00)
GLUCOSE: 92 mg/dL (ref 70–99)
Potassium: 3.8 mEq/L (ref 3.5–5.1)
SODIUM: 138 meq/L (ref 135–145)

## 2014-02-09 LAB — TSH: TSH: 1.75 u[IU]/mL (ref 0.35–5.50)

## 2014-02-09 NOTE — Patient Instructions (Signed)
Your next office appointment will be determined based upon review of your pending labs . Those instructions will be transmitted to you through My Chart   Reflux of gastric acid may be asymptomatic as this may occur mainly during sleep.The triggers for reflux  include stress; the "aspirin family" ; alcohol; peppermint; and caffeine (coffee, tea, cola, and chocolate). The aspirin family would include aspirin and the nonsteroidal agents such as ibuprofen &  Naproxen. Tylenol would not cause reflux. If having symptoms ; food & drink should be avoided for @ least 2 hours before going to bed.  

## 2014-02-09 NOTE — Progress Notes (Signed)
Subjective:    Patient ID: [name], female    DOB: [birth year], 68 y.o.   MRN: [id number]  HPI Medicare Wellness Visit: Psychosocial and medical history were reviewed as required by Medicare (history related to abuse, antisocial behavior , firearm risk). Social history: Caffeine:no  , Alcohol: 4/week  , Tobacco QJJ:HERDE Exercise:limited by arthritis Personal safety/fall risk:no Limitations of activities of daily living:dexterity affected by arthritis Seatbelt/ smoke alarm use:yes Healthcare Power of Attorney/Living Will status: in place Ophthalmologic exam status:UTD Hearing evaluation status:not UTD Orientation: Oriented X 3 Memory and recall: good Spelling or math testing: good Depression/anxiety assessment: no Foreign travel history:[place]  Immunization status for influenza/pneumonia/ shingles /tetanus:UTD Transfusion history:no Preventive health care maintenance status: Colonoscopy/BMD/mammogram/Pap as per protocol/standard care:UTD Dental care:every 6 mos Chart reviewed and updated. Active issues reviewed and addressed as documented below.    Review of Systems  She denies unexplained weight loss, abdominal pain,  dysphagia, melena, rectal bleeding, or persistently small caliber stools.She is on bid PPI; she still has some dyspepsia.  The possible impact of the twice a day PPI  has not been discussed with her densitometrist, [name] at [facility].        Objective:   Physical Exam  Gen.: Healthy and well-nourished in appearance. Alert, appropriate and cooperative throughout exam. Appears younger than stated age  Head: Normocephalic without obvious abnormalities Eyes: No corneal or conjunctival inflammation noted. Pupils equal round reactive to light and accommodation. Extraocular motion intact.  Ears: External  ear exam reveals no significant lesions or deformities. Canals clear .TMs normal. Hearing is grossly normal bilaterally. Nose: External nasal exam reveals  no deformity or inflammation. Nasal mucosa are pink and moist. No lesions or exudates noted.   Mouth: Oral mucosa and oropharynx reveal no lesions or exudates. Teeth in good repair. Neck: No deformities, masses, or tenderness noted. Range of motion & Thyroid normal Lungs: Normal respiratory effort; chest expands symmetrically. Lungs are clear to auscultation without rales, wheezes, or increased work of breathing. Heart: Normal rate and rhythm. Normal S1 and S2. No gallop, click, or rub.No murmur. Abdomen: Bowel sounds normal; abdomen soft and nontender. No masses, organomegaly or hernias noted. Genitalia: as per Gyn                                  Musculoskeletal/extremities: No deformity or scoliosis noted of  the thoracic or lumbar spine.   No clubbing, cyanosis, or edema noted. Range of motion normal .Tone & strength normal. 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  Fingernail  health good. Able to lie down & sit up w/o help. Negative SLR bilaterally Vascular: Carotid, radial artery, dorsalis pedis and  posterior tibial pulses are full and equal. No bruits present. Neurologic: Alert and oriented x3. Deep tendon reflexes symmetrical and normal.  Gait normal . Skin: Intact without suspicious lesions or rashes. Lymph: No cervical, axillary lymphadenopathy present. Psych: Mood and affect are normal. Normally interactive  Assessment & Plan:  The labs will be reviewed and risks and options assessed. Written recommendations will be provided by mail or directly through My Chart.Further evaluation or change in medical therapy will be directed by those results.

## 2014-02-09 NOTE — Progress Notes (Signed)
Pre visit review using our clinic review tool, if applicable. No additional management support is needed unless otherwise documented below in the visit note. 

## 2014-02-10 ENCOUNTER — Encounter: Payer: Self-pay | Admitting: Internal Medicine

## 2014-02-10 NOTE — Assessment & Plan Note (Addendum)
  CBC & dif  Anti reflux measures  The PPI therapy may play a significant component in progression of osteopenia. This should be reviewed by Dr. Gale Journey when seen.

## 2014-02-10 NOTE — Assessment & Plan Note (Signed)
LFTs 

## 2014-02-10 NOTE — Assessment & Plan Note (Signed)
BMET, vit D level,TSH BMD every 25 months

## 2014-02-14 LAB — VITAMIN D 1,25 DIHYDROXY
Vitamin D 1, 25 (OH)2 Total: 70 pg/mL (ref 18–72)
Vitamin D2 1, 25 (OH)2: 13 pg/mL
Vitamin D3 1, 25 (OH)2: 57 pg/mL

## 2014-09-08 ENCOUNTER — Other Ambulatory Visit: Payer: Self-pay | Admitting: Neurology

## 2014-09-29 ENCOUNTER — Ambulatory Visit (INDEPENDENT_AMBULATORY_CARE_PROVIDER_SITE_OTHER): Payer: Medicare Other | Admitting: Neurology

## 2014-09-29 ENCOUNTER — Encounter: Payer: Self-pay | Admitting: Neurology

## 2014-09-29 VITALS — BP 131/71 | HR 76 | Temp 98.2°F | Resp 12 | Ht 64.0 in | Wt 134.0 lb

## 2014-09-29 DIAGNOSIS — F5102 Adjustment insomnia: Secondary | ICD-10-CM

## 2014-09-29 DIAGNOSIS — R0683 Snoring: Secondary | ICD-10-CM | POA: Insufficient documentation

## 2014-09-29 HISTORY — DX: Snoring: R06.83

## 2014-09-29 NOTE — Patient Instructions (Signed)
Insomnia Insomnia is frequent trouble falling and/or staying asleep. Insomnia can be a long term problem or a short term problem. Both are common. Insomnia can be a short term problem when the wakefulness is related to a certain stress or worry. Long term insomnia is often related to ongoing stress during waking hours and/or poor sleeping habits. Overtime, sleep deprivation itself can make the problem worse. Every little thing feels more severe because you are overtired and your ability to cope is decreased. CAUSES   Stress, anxiety, and depression.  Poor sleeping habits.  Distractions such as TV in the bedroom.  Naps close to bedtime.  Engaging in emotionally charged conversations before bed.  Technical reading before sleep.  Alcohol and other sedatives. They may make the problem worse. They can hurt normal sleep patterns and normal dream activity.  Stimulants such as caffeine for several hours prior to bedtime.  Pain syndromes and shortness of breath can cause insomnia.  Exercise late at night.  Changing time zones may cause sleeping problems (jet lag). It is sometimes helpful to have someone observe your sleeping patterns. They should look for periods of not breathing during the night (sleep apnea). They should also look to see how long those periods last. If you live alone or observers are uncertain, you can also be observed at a sleep clinic where your sleep patterns will be professionally monitored. Sleep apnea requires a checkup and treatment. Give your caregivers your medical history. Give your caregivers observations your family has made about your sleep.  SYMPTOMS   Not feeling rested in the morning.  Anxiety and restlessness at bedtime.  Difficulty falling and staying asleep. TREATMENT   Your caregiver may prescribe treatment for an underlying medical disorders. Your caregiver can give advice or help if you are using alcohol or other drugs for self-medication. Treatment  of underlying problems will usually eliminate insomnia problems.  Medications can be prescribed for short time use. They are generally not recommended for lengthy use.  Over-the-counter sleep medicines are not recommended for lengthy use. They can be habit forming.  You can promote easier sleeping by making lifestyle changes such as:  Using relaxation techniques that help with breathing and reduce muscle tension.  Exercising earlier in the day.  Changing your diet and the time of your last meal. No night time snacks.  Establish a regular time to go to bed.  Counseling can help with stressful problems and worry.  Soothing music and white noise may be helpful if there are background noises you cannot remove.  Stop tedious detailed work at least one hour before bedtime. HOME CARE INSTRUCTIONS   Keep a diary. Inform your caregiver about your progress. This includes any medication side effects. See your caregiver regularly. Take note of:  Times when you are asleep.  Times when you are awake during the night.  The quality of your sleep.  How you feel the next day. This information will help your caregiver care for you.  Get out of bed if you are still awake after 15 minutes. Read or do some quiet activity. Keep the lights down. Wait until you feel sleepy and go back to bed.  Keep regular sleeping and waking hours. Avoid naps.  Exercise regularly.  Avoid distractions at bedtime. Distractions include watching television or engaging in any intense or detailed activity like attempting to balance the household checkbook.  Develop a bedtime ritual. Keep a familiar routine of bathing, brushing your teeth, climbing into bed at the same   time each night, listening to soothing music. Routines increase the success of falling to sleep faster.  Use relaxation techniques. This can be using breathing and muscle tension release routines. It can also include visualizing peaceful scenes. You can  also help control troubling or intruding thoughts by keeping your mind occupied with boring or repetitive thoughts like the old concept of counting sheep. You can make it more creative like imagining planting one beautiful flower after another in your backyard garden.  During your day, work to eliminate stress. When this is not possible use some of the previous suggestions to help reduce the anxiety that accompanies stressful situations. MAKE SURE YOU:   Understand these instructions.  Will watch your condition.  Will get help right away if you are not doing well or get worse. Document Released: 10/05/2000 Document Revised: 12/31/2011 Document Reviewed: 11/05/2007 ExitCare Patient Information 2015 [employer]. This information is not intended to replace advice given to you by your health care provider. Make sure you discuss any questions you have with your health care provider.  

## 2014-09-29 NOTE — Progress Notes (Signed)
SLEEP [facility]   Provider:  Larey Seat, M D  Referring Provider: Hendricks Limes, MD Primary Care Physician:  Unice Cobble, MD  Chief Complaint  Patient presents with  . Follow-up    rm 11    HPI:  [name] is a 68 y.o. female  Is seen here as a revisit  from Dr. Linna Darner for chronic insomnia, treated with Seroquel .  Her last appointment  was with Cecille Rubin, [facility].  She reports cyclic insomnia, and related to stressful situation. Her father was an alcoholic and air Customer service manager. The patient moved a lot with her family and has had lifelong fear of going to sleep, related to her experiences with an intoxicated father coming home and being belligerent, but she was never physically abused. Marland Kitchen  She was born on [place], her brother in [place] and her sister in [place]. She never lived in a place longer than 4 years.    HPI: Patient returns for followup after last visit 11/12/2012. Tizanidine has helped for her symptoms of cervical spine disease and it also helps her to sleep. She has a history of chronic insomnia and is currently on Seroquel doing well on that medication she continues to exercise daily. She has no new neurologic complaints  ROS: insomnia.                                  Review of Systems: Out of a complete 14 system review, the patient complains of only the following symptoms, and all other reviewed systems are negative.   Epworth score 01 , Fatigue severity score 10  , depression score 2    History   Social History  . Marital Status: Married    Spouse Name: N/A    Number of Children: 0  . Years of Education: N/A   Occupational History  . retired    Social History Main Topics  . Smoking status: Never Smoker   . Smokeless tobacco: Never Used  . Alcohol Use: 2.0 oz/week    4 drink(s) per week  . Drug Use: No  . Sexual Activity: Yes    Birth Control/ Protection: Surgical     Comment: not much   Other Topics Concern  . Not on  file   Social History Narrative    Family History  Problem Relation Age of Onset  . Ovarian cancer Mother 11  . Stroke Sister 31  . Diabetes Paternal Grandmother   . Heart disease Neg Hx   . Hypertension Neg Hx   . Hyperlipidemia Neg Hx   . Colon cancer Neg Hx   . Alcohol abuse Father   . Cirrhosis Father     Past Medical History  Diagnosis Date  . Osteopenia     [facility]  . Endometriosis     Dr Cherylann Banas  . Arthritis     Dr Estanislado Pandy  . Cataract   . GERD (gastroesophageal reflux disease)   . Insomnia due to anxiety and fear   . Snoring 09/29/2014    Past Surgical History  Procedure Laterality Date  . Abdominal hysterectomy  1987    TAH,BSO, APPENDECTOMY  . Appendectomy  1987    APPENDECTOMY AT [facility],BSO  . Knee arthroscopy Bilateral   . Replacement total knee Bilateral     X 1 each; Dr Percell Miller  . Rotator cuff repair Right 2010  . Colonoscopy  X 3; [facility]. All negative    Current Outpatient Prescriptions  Medication Sig Dispense Refill  . BOOSTRIX 5-2.5-18.5 injection   0  . Calcium Carbonate-Vitamin D (CALCIUM + D PO) Take by mouth.    . Evening Primrose Oil CAPS Take 2 capsules by mouth daily.    Marland Kitchen FLUZONE HIGH-DOSE injection     . GLUCOSAMINE PO Take by mouth.    . Multiple Vitamin (MULTIVITAMIN) capsule Take 1 capsule by mouth daily.    . Omega-3 Fatty Acids (OMEGA 3 PO) Take by mouth.    Marland Kitchen PREVNAR 13 SUSP injection   0  . QUEtiapine (SEROQUEL) 25 MG tablet TAKE 3 TABLETS BY MOUTH EVERY NIGHT AT BEDTIME 270 tablet 3  . tiZANidine (ZANAFLEX) 4 MG tablet TAKE 1 TABLET BY MOUTH AT BEDTIME 30 tablet 0  . triamterene-hydrochlorothiazide (DYAZIDE) 37.5-25 MG per capsule Take 1 each (1 capsule total) by mouth every morning. 90 capsule 4   No current facility-administered medications for this visit.    Allergies as of 09/29/2014 - Review Complete 09/29/2014  Allergen Reaction Noted  . Betadine [povidone iodine]  01/19/2014  . Formaldehyde  01/19/2014    . Codeine Nausea And Vomiting 01/03/2012    Vitals: BP 131/71 mmHg  Pulse 76  Temp(Src) 98.2 F (36.8 C) (Oral)  Resp 12  Ht 5\' 4"  (2.703 m)  Wt 134 lb (60.782 kg)  BMI 22.99 kg/m2 Last Weight:  Wt Readings from Last 1 Encounters:  09/29/14 134 lb (60.782 kg)       Last Height:   Ht Readings from Last 1 Encounters:  09/29/14 5\' 4"  (1.626 m)    Physical exam:  General: The patient is awake, alert and appears not in acute distress. The patient is well groomed. Head: Normocephalic, atraumatic. Neck is supple. Mallampati 2   neck circumference 14 . Nasal airflow unrestricted  TMJ is not evident . Retrognathia is not seen.  Cardiovascular:  Regular rate and rhythm , without  murmurs or carotid bruit, and without distended neck veins. Respiratory: Lungs are clear to auscultation. Skin:  Without evidence of edema, or rash Trunk: this patient  has normal posture.  Neurologic exam : The patient is awake and alert, oriented to place and time.   Memory subjective described as intact.  There is a normal attention span & concentration ability.  Speech is fluent without dysarthria, dysphonia or aphasia.  Mood and affect are appropriate.  Cranial nerves: Pupils are equal and briskly reactive to light.  Extraocular movements  in vertical and horizontal planes intact and without nystagmus. Visual fields by finger perimetry are intact. Hearing to finger rub intact. Facial sensation intact to fine touch. Facial motor strength is symmetric and tongue and uvula move midline.  Motor exam: Normal tone muscle bulk and symmetric strength in all extremities.  Sensory:  Fine touch, pinprick and vibration were tested in all extremities.  Proprioception is t normal.  Coordination: Rapid alternating movements in the fingers/hands is normal.  Finger-to-nose maneuver  normal without evidence of ataxia, dysmetria or tremor.  Gait and station: Patient walks without assistive device and is able  unassisted to climb up to the exam table.  Strength within normal limits. Stance is stable and normal. Tandem gait is unfragmented. Romberg testing is negative.  Deep tendon reflexes: in the  upper and lower extremities are symmetric and intact. Babinski maneuver response is downgoing.   Assessment:  After physical and neurologic examination, review of laboratory studies, imaging, neurophysiology testing and  pre-existing records, assessment is  1) chronic insomnia with cyclic waxing and waning. Psychological stress triggers cycles.     The patient was advised of the nature of the diagnosed sleep disorder , the treatment options and risks for general a health and wellness arising from not treating the condition. Visit duration was 25  minutes.   Plan:  Treatment plan and additional workup :  1) Continue current medications.     Asencion Partridge Brynlynn Walko MD  09/29/2014

## 2014-10-25 ENCOUNTER — Telehealth: Payer: Self-pay | Admitting: Neurology

## 2014-10-25 MED ORDER — QUETIAPINE FUMARATE 25 MG PO TABS: 75.0000 mg | ORAL_TABLET | Freq: Every day | ORAL | Status: DC

## 2014-10-25 MED ORDER — TIZANIDINE HCL 4 MG PO TABS: 4.0000 mg | ORAL_TABLET | Freq: Every day | ORAL | Status: DC

## 2014-10-25 NOTE — Telephone Encounter (Signed)
Rx has been sent  

## 2014-10-25 NOTE — Telephone Encounter (Signed)
Patient stated she's competly out of Rx QUEtiapine (SEROQUEL) 25 MG tablet and tiZANidine (ZANAFLEX) 4 MG tablet.  Please forward to [facility].

## 2014-10-27 ENCOUNTER — Ambulatory Visit (INDEPENDENT_AMBULATORY_CARE_PROVIDER_SITE_OTHER): Payer: PPO | Admitting: Internal Medicine

## 2014-10-27 ENCOUNTER — Encounter: Payer: Self-pay | Admitting: Internal Medicine

## 2014-10-27 VITALS — BP 136/88 | HR 70 | Temp 98.6°F | Ht 64.0 in | Wt 122.4 lb

## 2014-10-27 DIAGNOSIS — J069 Acute upper respiratory infection, unspecified: Secondary | ICD-10-CM

## 2014-10-27 MED ORDER — AMOXICILLIN 500 MG PO CAPS: 500.0000 mg | ORAL_CAPSULE | Freq: Three times a day (TID) | ORAL | Status: DC

## 2014-10-27 NOTE — Progress Notes (Signed)
   Subjective:    Patient ID: [name], female    DOB: Feb 07, 1946, 69 y.o.   MRN: [id number]  HPI  Her symptoms began yesterday as a sore throat; this has been associated with rhinitis with clear secretions and some chest congestion  She was concerned as she's had a history of strep  She's felt hot but hasn't documented any definite fever  The cough is nonproductive.  She has no other signs of upper respiratory tract infection.  Centor criteria  (fever, pharyngeal exudate, tender cervical lymphadenopathy, and absence of cough)  are negative.  Review of Systems Frontal headache, facial pain , nasal purulence, dental pain, sore throat , otic pain or otic discharge denied. No fever , chills or sweats       Objective:   Physical Exam  General appearance:good health ;well nourished; no acute distress or increased work of breathing is present.  No  lymphadenopathy about the head, neck, or axilla noted.   Eyes: No conjunctival inflammation or lid edema is present. There is no scleral icterus.  Ears:  External ear exam shows no significant lesions or deformities.  Otoscopic examination reveals clear canals, tympanic membranes are intact bilaterally without bulging, retraction, inflammation or discharge.  Nose:  External nasal examination shows no deformity or inflammation. Nasal mucosa are pink and moist without lesions or exudates. No septal dislocation or deviation.No obstruction to airflow.   Oral exam: Dental hygiene is good; lips and gums are healthy appearing.There is no oropharyngeal erythema or exudate noted.   Neck:  No deformities, thyromegaly, masses, or tenderness noted.   Supple with full range of motion without pain.   Heart:  Normal rate and regular rhythm. S1 and S2 normal without gallop, murmur, click, rub or other extra sounds.   Lungs:Chest clear to auscultation; no wheezes, rhonchi,rales ,or rubs present.No increased work of breathing.    Extremities:  No  cyanosis, edema, or clubbing  noted    Skin: Warm & dry w/o jaundice or tenting.       Assessment & Plan:  #1 viral URI #2 pharyngitis See orders & AVS

## 2014-10-27 NOTE — Progress Notes (Signed)
Pre visit review using our clinic review tool, if applicable. No additional management support is needed unless otherwise documented below in the visit note. 

## 2014-10-27 NOTE — Patient Instructions (Addendum)
Zicam Melts or Zinc lozenges as per package label for sore throat . Complementary options include  vitamin C 2000 mg daily; & Echinacea for 4-7 days.    Plain Mucinex (NOT D) for thick secretions ;force NON dairy fluids .   Nasal cleansing in the shower as discussed with lather of mild shampoo.After 10 seconds wash off lather while  exhaling through nostrils. Make sure that all residual soap is removed to prevent irritation.  Flonase OR Nasacort AQ 1 spray in each nostril twice a day as needed. Use the "crossover" technique into opposite nostril spraying toward opposite ear @ 45 degree angle, not straight up into nostril.  Plain Allegra (NOT D )  160 daily , Loratidine 10 mg , OR Zyrtec 10 mg @ bedtime  as needed for itchy eyes & sneezing.  Fill the  prescription for antibiotic it there is not dramatic improvement in the next 48-72 hours.

## 2014-12-30 ENCOUNTER — Encounter: Payer: Self-pay | Admitting: Women's Health

## 2015-01-26 ENCOUNTER — Encounter: Payer: Self-pay | Admitting: Women's Health

## 2015-01-26 ENCOUNTER — Ambulatory Visit (INDEPENDENT_AMBULATORY_CARE_PROVIDER_SITE_OTHER): Payer: PPO | Admitting: Women's Health

## 2015-01-26 VITALS — BP 128/80 | Ht 64.0 in | Wt 126.0 lb

## 2015-01-26 DIAGNOSIS — Z01419 Encounter for gynecological examination (general) (routine) without abnormal findings: Secondary | ICD-10-CM | POA: Diagnosis not present

## 2015-01-26 DIAGNOSIS — M8580 Other specified disorders of bone density and structure, unspecified site: Secondary | ICD-10-CM

## 2015-01-26 DIAGNOSIS — Z780 Asymptomatic menopausal state: Secondary | ICD-10-CM

## 2015-01-26 MED ORDER — TRIAMTERENE-HCTZ 37.5-25 MG PO CAPS: 1.0000 | ORAL_CAPSULE | ORAL | Status: DC

## 2015-01-26 NOTE — Progress Notes (Addendum)
WAFAA DEEMER [birth year] [id number]    History:    Presents for breast and pelvic  exam.  Postmenopausal, no HRT, 1987  TAH with BSO endometriosis.  Normal Pap and mammogram history (3D, dense breast tissue). 2014 negative colonoscopy. 2014 DEXA T score -1.6 right hip FRAX 11.8%/1.6%. Marland Kitchen Spills calcium in urine, is being followed by Dr. Gale Journey at [facility], and takes Dyazide. Up to date on vaccinations.  Past medical history, past surgical history, family history and social history were all reviewed and documented in the EPIC chart. Retired Education officer, museum. Is very active (spinning and works with trainer 5-6x/week) and enjoys spending time with horses. Has sister who is mentally ill and causes her stress.  ROS:  A ROS was performed and pertinent positives and negatives are included.  Exam:  Filed Vitals:   01/26/15 1407  BP: 128/80    General appearance:  Pleasant adult female Thyroid:  Symmetrical, normal in size, without palpable masses or nodularity. Respiratory  Auscultation:  Clear without wheezing or rhonchi Cardiovascular  Auscultation:  Regular rate, without rubs, murmurs or gallops  Edema/varicosities:  Not grossly evident Abdominal  Soft,nontender, without masses, guarding or rebound.  Liver/spleen:  No organomegaly noted  Hernia:  None appreciated  Skin  Inspection:  Grossly normal   Breasts: Examined lying and sitting.     Right: Without masses, retractions, discharge or axillary adenopathy.     Left: Without masses, retractions, discharge or axillary adenopathy. Gentitourinary   Inguinal/mons:  Normal without inguinal adenopathy  External genitalia:  Normal  BUS/Urethra/Skene's glands:  Normal  Vagina:  Normal  Cervix:  Uterus absent   Rt: Without masses or tenderness.   Lt: Without masses or tenderness.  Anus and perineum: Normal  Digital rectal exam: Normal sphincter tone without palpated masses or tenderness Extremities: Evidence of arthritis in hands and feet, some  scarring from previous knee surgeries  Assessment/Plan:  69 y.o. MWF G0 for annual exam.  Osteopenia, without elevated FRAX Dr. Gale Journey at [facility] -calcium in urine/Dyazide daily Arthritis-rheumatologist manages Chronic insomnia Dr. Brett Fairy manages medications  Plan: SBEs, encouraged continuing with regular exercise and a healthy diet. Plan for annual 3D mammogram due to history of dense breast tissue. Dyazide 37.5/25 prescription, proper use, reviewed continue care with Dr. Gale Journey has follow-up scheduled in August. Schedule repeat DEXA, home safety, fall prevention and importance of regular exercise for bone health reviewed. UA. Pap screening guidelines reviewed.   Huel Cote WHNP, 2:45 PM 01/26/2015

## 2015-01-26 NOTE — Patient Instructions (Signed)
Health Recommendations for Postmenopausal Women Respected and ongoing research has looked at the most common causes of death, disability, and poor quality of life in postmenopausal women. The causes include heart disease, diseases of blood vessels, diabetes, depression, cancer, and bone loss (osteoporosis). Many things can be done to help lower the chances of developing these and other common problems. CARDIOVASCULAR DISEASE Heart Disease: A heart attack is a medical emergency. Know the signs and symptoms of a heart attack. Below are things women can do to reduce their risk for heart disease.   Do not smoke. If you smoke, quit.  Aim for a healthy weight. Being overweight causes many preventable deaths. Eat a healthy and balanced diet and drink an adequate amount of liquids.  Get moving. Make a commitment to be more physically active. Aim for 30 minutes of activity on most, if not all days of the week.  Eat for heart health. Choose a diet that is low in saturated fat and cholesterol and eliminate trans fat. Include whole grains, vegetables, and fruits. Read and understand the labels on food containers before buying.  Know your numbers. Ask your caregiver to check your blood pressure, cholesterol (total, HDL, LDL, triglycerides) and blood glucose. Work with your caregiver on improving your entire clinical picture.  High blood pressure. Limit or stop your table salt intake (try salt substitute and food seasonings). Avoid salty foods and drinks. Read labels on food containers before buying. Eating well and exercising can help control high blood pressure. STROKE  Stroke is a medical emergency. Stroke may be the result of a blood clot in a blood vessel in the brain or by a brain hemorrhage (bleeding). Know the signs and symptoms of a stroke. To lower the risk of developing a stroke:  Avoid fatty foods.  Quit smoking.  Control your diabetes, blood pressure, and irregular heart rate. THROMBOPHLEBITIS  (BLOOD CLOT) OF THE LEG  Becoming overweight and leading a stationary lifestyle may also contribute to developing blood clots. Controlling your diet and exercising will help lower the risk of developing blood clots. CANCER SCREENING  Breast Cancer: Take steps to reduce your risk of breast cancer.  You should practice "breast self-awareness." This means understanding the normal appearance and feel of your breasts and should include breast self-examination. Any changes detected, no matter how small, should be reported to your caregiver.  After age 40, you should have a clinical breast exam (CBE) every year.  Starting at age 40, you should consider having a mammogram (breast X-ray) every year.  If you have a family history of breast cancer, talk to your caregiver about genetic screening.  If you are at high risk for breast cancer, talk to your caregiver about having an MRI and a mammogram every year.  Intestinal or Stomach Cancer: Tests to consider are a rectal exam, fecal occult blood, sigmoidoscopy, and colonoscopy. Women who are high risk may need to be screened at an earlier age and more often.  Cervical Cancer:  Beginning at age 30, you should have a Pap test every 3 years as long as the past 3 Pap tests have been normal.  If you have had past treatment for cervical cancer or a condition that could lead to cancer, you need Pap tests and screening for cancer for at least 20 years after your treatment.  If you had a hysterectomy for a problem that was not cancer or a condition that could lead to cancer, then you no longer need Pap tests.    If you are between ages 65 and 70, and you have had normal Pap tests going back 10 years, you no longer need Pap tests.  If Pap tests have been discontinued, risk factors (such as a new sexual partner) need to be reassessed to determine if screening should be resumed.  Some medical problems can increase the chance of getting cervical cancer. In these  cases, your caregiver may recommend more frequent screening and Pap tests.  Uterine Cancer: If you have vaginal bleeding after reaching menopause, you should notify your caregiver.  Ovarian Cancer: Other than yearly pelvic exams, there are no reliable tests available to screen for ovarian cancer at this time except for yearly pelvic exams.  Lung Cancer: Yearly chest X-rays can detect lung cancer and should be done on high risk women, such as cigarette smokers and women with chronic lung disease (emphysema).  Skin Cancer: A complete body skin exam should be done at your yearly examination. Avoid overexposure to the sun and ultraviolet light lamps. Use a strong sun block cream when in the sun. All of these things are important for lowering the risk of skin cancer. MENOPAUSE Menopause Symptoms: Hormone therapy products are effective for treating symptoms associated with menopause:  Moderate to severe hot flashes.  Night sweats.  Mood swings.  Headaches.  Tiredness.  Loss of sex drive.  Insomnia.  Other symptoms. Hormone replacement carries certain risks, especially in older women. Women who use or are thinking about using estrogen or estrogen with progestin treatments should discuss that with their caregiver. Your caregiver will help you understand the benefits and risks. The ideal dose of hormone replacement therapy is not known. The Food and Drug Administration (FDA) has concluded that hormone therapy should be used only at the lowest doses and for the shortest amount of time to reach treatment goals.  OSTEOPOROSIS Protecting Against Bone Loss and Preventing Fracture If you use hormone therapy for prevention of bone loss (osteoporosis), the risks for bone loss must outweigh the risk of the therapy. Ask your caregiver about other medications known to be safe and effective for preventing bone loss and fractures. To guard against bone loss or fractures, the following is recommended:  If  you are younger than age 50, take 1000 mg of calcium and at least 600 mg of Vitamin D per day.  If you are older than age 50 but younger than age 70, take 1200 mg of calcium and at least 600 mg of Vitamin D per day.  If you are older than age 70, take 1200 mg of calcium and at least 800 mg of Vitamin D per day. Smoking and excessive alcohol intake increases the risk of osteoporosis. Eat foods rich in calcium and vitamin D and do weight bearing exercises several times a week as your caregiver suggests. DIABETES Diabetes Mellitus: If you have type I or type 2 diabetes, you should keep your blood sugar under control with diet, exercise, and recommended medication. Avoid starchy and fatty foods, and too many sweets. Being overweight can make diabetes control more difficult. COGNITION AND MEMORY Cognition and Memory: Menopausal hormone therapy is not recommended for the prevention of cognitive disorders such as Alzheimer's disease or memory loss.  DEPRESSION  Depression may occur at any age, but it is common in elderly women. This may be because of physical, medical, social (loneliness), or financial problems and needs. If you are experiencing depression because of medical problems and control of symptoms, talk to your caregiver about this. Physical   activity and exercise may help with mood and sleep. Community and volunteer involvement may improve your sense of value and worth. If you have depression and you feel that the problem is getting worse or becoming severe, talk to your caregiver about which treatment options are best for you. ACCIDENTS  Accidents are common and can be serious in elderly woman. Prepare your house to prevent accidents. Eliminate throw rugs, place hand bars in bath, shower, and toilet areas. Avoid wearing high heeled shoes or walking on wet, snowy, and icy areas. Limit or stop driving if you have vision or hearing problems, or if you feel you are unsteady with your movements and  reflexes. HEPATITIS C Hepatitis C is a type of viral infection affecting the liver. It is spread mainly through contact with blood from an infected person. It can be treated, but if left untreated, it can lead to severe liver damage over the years. Many people who are infected do not know that the virus is in their blood. If you are a "baby-boomer", it is recommended that you have one screening test for Hepatitis C. IMMUNIZATIONS  Several immunizations are important to consider having during your senior years, including:   Tetanus, diphtheria, and pertussis booster shot.  Influenza every year before the flu season begins.  Pneumonia vaccine.  Shingles vaccine.  Others, as indicated based on your specific needs. Talk to your caregiver about these. Document Released: 11/30/2005 Document Revised: 02/22/2014 Document Reviewed: 07/26/2008 ExitCare Patient Information 2015 [employer]. This information is not intended to replace advice given to you by your health care provider. Make sure you discuss any questions you have with your health care provider.  

## 2015-01-27 LAB — URINALYSIS, ROUTINE W REFLEX MICROSCOPIC
BILIRUBIN URINE: NEGATIVE
Glucose, UA: NEGATIVE mg/dL
Hgb urine dipstick: NEGATIVE
Leukocytes, UA: NEGATIVE
Nitrite: NEGATIVE
PROTEIN: NEGATIVE mg/dL
SPECIFIC GRAVITY, URINE: 1.023 (ref 1.005–1.030)
Urobilinogen, UA: 0.2 mg/dL (ref 0.0–1.0)
pH: 6.5 (ref 5.0–8.0)

## 2015-03-02 ENCOUNTER — Encounter: Payer: Self-pay | Admitting: Gastroenterology

## 2015-03-14 ENCOUNTER — Other Ambulatory Visit: Payer: Self-pay | Admitting: *Deleted

## 2015-03-14 DIAGNOSIS — Z13820 Encounter for screening for osteoporosis: Secondary | ICD-10-CM

## 2015-03-21 ENCOUNTER — Other Ambulatory Visit: Payer: Self-pay | Admitting: Women's Health

## 2015-03-22 NOTE — Telephone Encounter (Signed)
Okay for the refill for Dyazide for one year if she has seen Dr. Gale Journey at [facility], history of spilling calcium in her urine.

## 2015-04-21 ENCOUNTER — Encounter: Payer: Self-pay | Admitting: Women's Health

## 2015-08-17 ENCOUNTER — Ambulatory Visit
Admission: RE | Admit: 2015-08-17 | Discharge: 2015-08-17 | Disposition: A | Discharge status: Home / Self Care | Payer: PPO | Source: Ambulatory Visit | Attending: Endocrinology | Admitting: Endocrinology

## 2015-08-17 ENCOUNTER — Other Ambulatory Visit: Payer: Self-pay | Admitting: Endocrinology

## 2015-08-17 DIAGNOSIS — M8580 Other specified disorders of bone density and structure, unspecified site: Secondary | ICD-10-CM

## 2015-08-17 DIAGNOSIS — E041 Nontoxic single thyroid nodule: Secondary | ICD-10-CM

## 2015-08-18 ENCOUNTER — Ambulatory Visit
Admission: RE | Admit: 2015-08-18 | Discharge: 2015-08-18 | Disposition: A | Discharge status: Home / Self Care | Payer: PPO | Source: Ambulatory Visit | Attending: Endocrinology | Admitting: Endocrinology

## 2015-08-18 DIAGNOSIS — E041 Nontoxic single thyroid nodule: Secondary | ICD-10-CM

## 2015-09-29 ENCOUNTER — Encounter: Payer: Self-pay | Admitting: Nurse Practitioner

## 2015-09-29 ENCOUNTER — Ambulatory Visit (INDEPENDENT_AMBULATORY_CARE_PROVIDER_SITE_OTHER): Payer: PPO | Admitting: Nurse Practitioner

## 2015-09-29 VITALS — BP 124/73 | HR 78 | Ht 64.0 in | Wt 128.2 lb

## 2015-09-29 DIAGNOSIS — F409 Phobic anxiety disorder, unspecified: Secondary | ICD-10-CM

## 2015-09-29 DIAGNOSIS — G4700 Insomnia, unspecified: Secondary | ICD-10-CM | POA: Diagnosis not present

## 2015-09-29 DIAGNOSIS — F5105 Insomnia due to other mental disorder: Secondary | ICD-10-CM

## 2015-09-29 MED ORDER — QUETIAPINE FUMARATE 25 MG PO TABS: 75.0000 mg | ORAL_TABLET | Freq: Every day | ORAL | Status: DC

## 2015-09-29 MED ORDER — TIZANIDINE HCL 4 MG PO TABS: 4.0000 mg | ORAL_TABLET | Freq: Every day | ORAL | Status: DC

## 2015-09-29 NOTE — Patient Instructions (Signed)
Continue Seroquel at current dose will refill Continue Zanaflex  At current dose  F/U yearly   

## 2015-09-29 NOTE — Progress Notes (Signed)
[facility]  PATIENT: [name] DOB: 10-07-1946   REASON FOR VISIT: Follow-up for insomnia HISTORY FROM: Patient    HISTORY OF PRESENT ILLNESS:[name] is a 69 y.o. female Is seen here as a revisit from Dr. Linna Darner for chronic insomnia, treated with Seroquel .  She was last seen by Dr. Brett Fairy 12 /06/2014 .She reports cyclic insomnia, and related to stressful situation. Her father was an alcoholic and air Customer service manager. The patient moved a lot with her family and has had lifelong fear of going to sleep, related to her experiences with an intoxicated father coming home and being belligerent, but she was never physically abused. Marland Kitchen  She was born on [place], her brother in [place] and her sister in [place]. Tizanidine has helped for her symptoms of cervical spine disease and it also helps her to sleep. She has a history of chronic insomnia and is currently on Seroquel doing well on that medication she continues to exercise daily. She has no new neurologic complaints   REVIEW OF SYSTEMS: Full 14 system review of systems performed and notable only for those listed, all others are neg:  Constitutional: neg  Cardiovascular: neg Ear/Nose/Throat: neg  Skin: neg Eyes: neg Respiratory: neg Gastroitestinal: neg  Hematology/Lymphatic: neg  Endocrine: neg Musculoskeletal:neg Allergy/Immunology: neg Neurological: neg Psychiatric: neg Sleep : neg   ALLERGIES: Allergies  Allergen Reactions  . Other     OPIODS  . Betadine [Povidone Iodine]   . Formaldehyde   . Codeine Nausea And Vomiting    HOME MEDICATIONS: Outpatient Prescriptions Prior to Visit  Medication Sig Dispense Refill  . Calcium Carbonate-Vitamin D (CALCIUM + D PO) Take 1 tablet by mouth daily.     . Evening Primrose Oil CAPS Take 2 capsules by mouth daily.    Marland Kitchen GLUCOSAMINE PO Take by mouth.    . Multiple Vitamin (MULTIVITAMIN) capsule Take 1 capsule by mouth daily.    . Omega-3 Fatty  Acids (OMEGA 3 PO) Take by mouth.    . QUEtiapine (SEROQUEL) 25 MG tablet Take 3 tablets (75 mg total) by mouth at bedtime. 270 tablet 3  . tiZANidine (ZANAFLEX) 4 MG tablet Take 1 tablet (4 mg total) by mouth at bedtime. 90 tablet 3  . triamterene-hydrochlorothiazide (DYAZIDE) 37.5-25 MG per capsule Take 1 each (1 capsule total) by mouth every morning. 90 capsule 4   No facility-administered medications prior to visit.    PAST MEDICAL HISTORY: Past Medical History  Diagnosis Date  . Arthritis     Dr Estanislado Pandy  . Cataract   . GERD (gastroesophageal reflux disease)   . Insomnia due to anxiety and fear   . Snoring 09/29/2014    PAST SURGICAL HISTORY: Past Surgical History  Procedure Laterality Date  . Abdominal hysterectomy  1987    TAH,BSO, APPENDECTOMY  . Appendectomy  1987    APPENDECTOMY AT TAH,BSO  . Knee arthroscopy Bilateral   . Replacement total knee Bilateral     X 1 each; Dr Percell Miller  . Rotator cuff repair Right 2010  . Colonoscopy       X 3; [facility]. All negative    FAMILY HISTORY: Family History  Problem Relation Age of Onset  . Ovarian cancer Mother 20  . Stroke Sister 63  . Diabetes Paternal Grandmother   . Heart disease Neg Hx   . Hypertension Neg Hx   . Hyperlipidemia Neg Hx   . Colon cancer Neg Hx   . Alcohol abuse Father   .  Cirrhosis Father     SOCIAL HISTORY: Social History   Social History  . Marital Status: Married    Spouse Name: N/A  . Number of Children: 0  . Years of Education: N/A   Occupational History  . retired    Social History Main Topics  . Smoking status: Never Smoker   . Smokeless tobacco: Never Used  . Alcohol Use: 2.0 oz/week    4 drink(s) per week  . Drug Use: No  . Sexual Activity: Yes    Birth Control/ Protection: Surgical     Comment: not much   Other Topics Concern  . Not on file   Social History Narrative     PHYSICAL EXAM  Filed Vitals:   09/29/15 1427  BP: 124/73  Pulse: 78  Height: 5\' 4"   (1.626 m)  Weight: 128 lb 3.2 oz (58.151 kg)   Body mass index is 21.99 kg/(m^2). General: well developed, well nourished, seated, in no evident distress Head: head normocephalic and atraumatic. Oropharynx benign Neck: supple with no carotid or supraclavicular bruits Cardiovascular: regular rate and rhythm, no murmurs Musculoskeletal skeletal changes in the hands fingers shoulders and the knees  Neurologic Exam Mental Status: Awake and fully alert. Oriented to place and time. Follows all commands Mood and affect appropriate. ESS 3. FSS 13 Cranial Nerves:Pupils equal, briskly reactive to light. Extraocular movements full without nystagmus. Visual fields full to confrontation. Hearing intact and symmetric to finger snap. Facial sensation intact. Face, tongue, palate move normally and symmetrically. Neck flexion and extension normal.  Motor: Normal bulk and tone. Normal strength in all tested extremity muscles. No focal weakness Sensory.: intact to touch and pinprick and vibratory.  Coordination: Rapid alternating movements normal in all extremities. Finger-to-nose and heel-to-shin performed accurately bilaterally. Gait and Station: Arises from chair without difficulty. Stance is normal. Gait demonstrates normal stride length and balance . Able to heel, toe and tandem walk without difficulty.  Reflexes: 1+ and symmetric. Toes downgoing.  DIAGNOSTIC DATA (LABS, IMAGING, TESTING) -  ASSESSMENT AND PLAN  69 y.o. year old female  has a past medical history of Arthritis;  Insomnia due to anxiety and fear; and Snoring (09/29/2014). here to follow up.  Continue Seroquel at current dose will refill Continue Zanaflex  At current dose  F/U yearly  Dennie Bible, [facility], [facility], APRN  [facility] [address], [place],  [address] [phone]

## 2015-09-30 NOTE — Progress Notes (Signed)
I agree with the assessment and plan as directed by NP .The patient is known to me .   Trevis Eden, MD  

## 2015-10-19 ENCOUNTER — Encounter: Payer: Self-pay | Admitting: Family Medicine

## 2015-10-19 ENCOUNTER — Ambulatory Visit (INDEPENDENT_AMBULATORY_CARE_PROVIDER_SITE_OTHER): Payer: PPO | Admitting: Family Medicine

## 2015-10-19 VITALS — BP 128/82 | HR 72 | Temp 98.5°F | Resp 16 | Ht 64.0 in | Wt 130.1 lb

## 2015-10-19 DIAGNOSIS — J0100 Acute maxillary sinusitis, unspecified: Secondary | ICD-10-CM | POA: Diagnosis not present

## 2015-10-19 MED ORDER — AMOXICILLIN 875 MG PO TABS
875.0000 mg | ORAL_TABLET | Freq: Two times a day (BID) | ORAL | Status: DC
Start: 2015-10-19 — End: 2016-01-03

## 2015-10-19 NOTE — Assessment & Plan Note (Signed)
Pt's sxs and PE consistent w/ infxn.  Start abx.  Reviewed supportive care and red flags that should prompt return.  Pt expressed understanding and is in agreement w/ plan.  

## 2015-10-19 NOTE — Patient Instructions (Signed)
Follow up as needed Start the Amoxicillin twice daily- take w/ food Drink plenty of fluids REST!! Mucinex to thin your congestion Ibuprofen as needed for sore throat Call with any questions or concerns Happy New Year!!!

## 2015-10-19 NOTE — Progress Notes (Signed)
   Subjective:    Patient ID: [name], female    DOB: January 22, 1946, 69 y.o.   MRN: [id number]  HPI URI- last week pt developed nasal congestion, body aches.  + sore throat this AM, pt thought it was strep (hx of similar).  Sore throat has improved since this AM.  No fevers. + sinus pain/pressure.  No tooth pain.  + dizziness yesterday.  No ear pain.   + sick contacts.   Review of Systems For ROS see HPI     Objective:   Physical Exam  Constitutional: She appears well-developed and well-nourished. No distress.  HENT:  Head: Normocephalic and atraumatic.  Right Ear: Tympanic membrane normal.  Left Ear: Tympanic membrane normal.  Nose: Mucosal edema and rhinorrhea present. Right sinus exhibits maxillary sinus tenderness and frontal sinus tenderness. Left sinus exhibits maxillary sinus tenderness and frontal sinus tenderness.  Mouth/Throat: Uvula is midline and mucous membranes are normal. Posterior oropharyngeal erythema present. No oropharyngeal exudate.  Eyes: Conjunctivae and EOM are normal. Pupils are equal, round, and reactive to light.  Neck: Normal range of motion. Neck supple.  Cardiovascular: Normal rate, regular rhythm and normal heart sounds.   Pulmonary/Chest: Effort normal and breath sounds normal. No respiratory distress. She has no wheezes.  Lymphadenopathy:    She has no cervical adenopathy.  Vitals reviewed.         Assessment & Plan:

## 2015-10-19 NOTE — Progress Notes (Signed)
Pre visit review using our clinic review tool, if applicable. No additional management support is needed unless otherwise documented below in the visit note. 

## 2015-11-09 ENCOUNTER — Other Ambulatory Visit: Payer: Self-pay | Admitting: Neurology

## 2016-01-03 ENCOUNTER — Encounter: Payer: Self-pay | Admitting: Family Medicine

## 2016-01-03 ENCOUNTER — Ambulatory Visit (INDEPENDENT_AMBULATORY_CARE_PROVIDER_SITE_OTHER): Payer: PPO | Admitting: Family Medicine

## 2016-01-03 VITALS — BP 130/78 | HR 78 | Temp 98.3°F | Resp 16 | Ht 64.0 in | Wt 127.2 lb

## 2016-01-03 DIAGNOSIS — J0100 Acute maxillary sinusitis, unspecified: Secondary | ICD-10-CM | POA: Diagnosis not present

## 2016-01-03 MED ORDER — CETIRIZINE HCL 10 MG PO TABS: 10.0000 mg | ORAL_TABLET | Freq: Every day | ORAL | Status: DC

## 2016-01-03 MED ORDER — AMOXICILLIN 875 MG PO TABS: 875.0000 mg | ORAL_TABLET | Freq: Two times a day (BID) | ORAL | Status: DC

## 2016-01-03 NOTE — Progress Notes (Signed)
   Subjective:    Patient ID: [name], female    DOB: [birth year], 70 y.o.   MRN: [id number]  HPI URI- 'i thought that i just had a cold'.  sxs started 1 week ago.  Taking OTC cold meds and delsym cough syrup w/ temporary relief but overall, symptoms are worsening.  + nasal congestion.  Nasal drainage is now blood tinged.  Denies sinus pain/pressure.  No HA.  No tooth pain.  R ear pain and crusting/drainage.  + sick contacts.  Cough is dry.   Review of Systems For ROS see HPI     Objective:   Physical Exam  Constitutional: She appears well-developed and well-nourished. No distress.  HENT:  Head: Normocephalic and atraumatic.  Right Ear: Tympanic membrane normal.  Left Ear: Tympanic membrane normal.  Nose: Mucosal edema and rhinorrhea present. Right sinus exhibits maxillary sinus tenderness and frontal sinus tenderness. Left sinus exhibits maxillary sinus tenderness and frontal sinus tenderness.  Mouth/Throat: Uvula is midline and mucous membranes are normal. Posterior oropharyngeal erythema present. No oropharyngeal exudate.  Eyes: Conjunctivae and EOM are normal. Pupils are equal, round, and reactive to light.  Neck: Normal range of motion. Neck supple.  Cardiovascular: Normal rate, regular rhythm and normal heart sounds.   Pulmonary/Chest: Effort normal and breath sounds normal. No respiratory distress. She has no wheezes.  Lymphadenopathy:    She has no cervical adenopathy.  Vitals reviewed.         Assessment & Plan:

## 2016-01-03 NOTE — Patient Instructions (Signed)
Schedule your new to establish appt at your convenience Start the Amoxicillin twice daily for the sinus infection Start a daily allergy medication- a script for Zyrtec was sent to the pharmacy (but it may be cheaper OTC) Drink plenty of fluids Mucinex DM for cough and congestion REST!! Call with any questions or concerns If you want to join [place] at the new [place] office, any scheduled appointments will automatically transfer and we will see you at [address] [place] [address] [place], [place] [address] (OPENING 3/23) Tull in there!!!

## 2016-01-03 NOTE — Progress Notes (Signed)
Pre visit review using our clinic review tool, if applicable. No additional management support is needed unless otherwise documented below in the visit note. 

## 2016-01-03 NOTE — Assessment & Plan Note (Signed)
Pt's sxs and PE consistent w/ infxn.  Start abx.  Reviewed supportive care and red flags that should prompt return.  Pt expressed understanding and is in agreement w/ plan.  

## 2016-01-09 DIAGNOSIS — Z1231 Encounter for screening mammogram for malignant neoplasm of breast: Secondary | ICD-10-CM | POA: Diagnosis not present

## 2016-01-30 ENCOUNTER — Encounter: Payer: Self-pay | Admitting: Women's Health

## 2016-01-30 ENCOUNTER — Ambulatory Visit (INDEPENDENT_AMBULATORY_CARE_PROVIDER_SITE_OTHER): Payer: PPO | Admitting: Women's Health

## 2016-01-30 VITALS — BP 122/80 | Ht 64.0 in | Wt 130.0 lb

## 2016-01-30 DIAGNOSIS — Z01419 Encounter for gynecological examination (general) (routine) without abnormal findings: Secondary | ICD-10-CM

## 2016-01-30 NOTE — Patient Instructions (Signed)

## 2016-01-30 NOTE — Progress Notes (Signed)
[name] 06-Apr-1946 [id number]    History:    Presents for breast and pelvic exam. 1987 TAH with BSO for endometriosis on no HRT/hot sexually active husbands health.  2016 T score -1.8 FRAX 10.2%/1.8% stable from previous DEXA in 14. Normal Pap and mammogram history. Continues to excrete calcium in urine Dr. Rubie Maid manages. Current on vaccines. 2014 negative colonoscopy. Biggest problem rheumatoid arthritis, has had bilateral knee replacements.  Past medical history, past surgical history, family history and social history were all reviewed and documented in the EPIC chart. 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  ROS:  A ROS was performed and pertinent positives and negatives are included.  Exam:  Filed Vitals:   01/30/16 1416  BP: 122/80    General appearance:  Normal Thyroid:  Symmetrical, normal in size, without palpable masses or nodularity. Respiratory  Auscultation:  Clear without wheezing or rhonchi Cardiovascular  Auscultation:  Regular rate, without rubs, murmurs or gallops  Edema/varicosities:  Not grossly evident Abdominal  Soft,nontender, without masses, guarding or rebound.  Liver/spleen:  No organomegaly noted  Hernia:  None appreciated  Skin  Inspection:  Grossly normal   Breasts: Examined lying and sitting.     Right: Without masses, retractions, discharge or axillary adenopathy.     Left: Without masses, retractions, discharge or axillary adenopathy. Gentitourinary   Inguinal/mons:  Normal without inguinal adenopathy  External genitalia:  Normal  BUS/Urethra/Skene's glands:  Normal  Vagina:  Atrophic  Cervix:  And uterus absent  Adnexa/parametria:     Rt: Without masses or tenderness.   Lt: Without masses or tenderness.  Anus and perineum: Normal  Digital rectal exam: Normal sphincter tone without palpated masses or tenderness  Assessment/Plan:  70 y.o. MWF G0 for breast and pelvic exam with no  complaints.  TAH with BSO for endometriosis on no HRT Asymptomatic vaginal atrophy Osteopenia without elevated FRAX Rheumatoid arthritis-primary care manages labs and meds Calcium in urine-Dr. Rubie Maid manages  Plan: Home safety, fall prevention and importance of continuing weightbearing regular exercise reviewed. SBE's, continue annual 3-D screening mammogram history of dense breasts.Marland Kitchen    Huel C[facility], 5:17 PM 01/30/2016

## 2016-01-31 LAB — URINALYSIS W MICROSCOPIC + REFLEX CULTURE
BACTERIA UA: NONE SEEN [HPF]
BILIRUBIN URINE: NEGATIVE
CRYSTALS: NONE SEEN [HPF]
Casts: NONE SEEN [LPF]
Glucose, UA: NEGATIVE
HGB URINE DIPSTICK: NEGATIVE
KETONES UR: NEGATIVE
Leukocytes, UA: NEGATIVE
Nitrite: NEGATIVE
PROTEIN: NEGATIVE
RBC / HPF: NONE SEEN RBC/HPF (ref <=2)
SQUAMOUS EPITHELIAL / LPF: NONE SEEN [HPF] (ref <=5)
Specific Gravity, Urine: 1.011 (ref 1.001–1.035)
WBC UA: NONE SEEN WBC/HPF (ref <=5)
Yeast: NONE SEEN [HPF]
pH: 6.5 (ref 5.0–8.0)

## 2016-02-03 ENCOUNTER — Other Ambulatory Visit: Payer: Self-pay | Admitting: Women's Health

## 2016-02-08 ENCOUNTER — Other Ambulatory Visit: Payer: Self-pay | Admitting: Neurology

## 2016-02-25 DIAGNOSIS — M542 Cervicalgia: Secondary | ICD-10-CM | POA: Diagnosis not present

## 2016-03-14 DIAGNOSIS — M542 Cervicalgia: Secondary | ICD-10-CM | POA: Diagnosis not present

## 2016-03-14 DIAGNOSIS — M25511 Pain in right shoulder: Secondary | ICD-10-CM | POA: Diagnosis not present

## 2016-03-21 DIAGNOSIS — M25511 Pain in right shoulder: Secondary | ICD-10-CM | POA: Diagnosis not present

## 2016-04-17 ENCOUNTER — Other Ambulatory Visit: Payer: Self-pay | Admitting: Women's Health

## 2016-04-17 ENCOUNTER — Telehealth: Payer: Self-pay | Admitting: Women's Health

## 2016-04-17 ENCOUNTER — Telehealth: Payer: Self-pay | Admitting: *Deleted

## 2016-04-17 MED ORDER — TRIAMTERENE-HCTZ 37.5-25 MG PO CAPS: 1.0000 | ORAL_CAPSULE | Freq: Every morning | ORAL | Status: DC

## 2016-04-17 NOTE — Telephone Encounter (Signed)
Pt called stating nancy has been filling her HCTZ 37.5 mg yearly at annual per telephone encounter on 04/17/16 this is true. "Okay for refills for 1 year, tell her I am sorry I thought Dr. Gale Journey at [facility] was refilling but we have refilled this in the past.

## 2016-04-17 NOTE — Addendum Note (Signed)
Addended by: Ramond Craver on: 04/17/2016 10:24 AM   Modules accepted: Orders

## 2016-04-17 NOTE — Telephone Encounter (Signed)
[name] will call patient to schedule CE. 

## 2016-04-17 NOTE — Telephone Encounter (Signed)
Okay for refills for 1 year, tell her I am sorry I thought Dr. Gale Journey at [facility] was refilling but we have refilled this in the past.

## 2016-04-17 NOTE — Telephone Encounter (Signed)
I called [facility] to confirm that pt had refills there and the pharmacy said no refill were there. I told pharmacist okay to add 11 refills per nancy.

## 2016-04-17 NOTE — Telephone Encounter (Signed)
Patient was unaware of issue with annual exam so no need for apology.  Rx resent to pharmacy with refills for one year.

## 2016-04-17 NOTE — Telephone Encounter (Signed)
Okay for one month she is overdue for annual have her schedule/ she takes it to prevent calcium in her urine.

## 2016-04-17 NOTE — Telephone Encounter (Signed)
Jamey Ripa said patient just had CE 01/30/2016.  Additional refills?

## 2016-05-07 DIAGNOSIS — D692 Other nonthrombocytopenic purpura: Secondary | ICD-10-CM | POA: Diagnosis not present

## 2016-05-07 DIAGNOSIS — L249 Irritant contact dermatitis, unspecified cause: Secondary | ICD-10-CM | POA: Diagnosis not present

## 2016-05-08 ENCOUNTER — Other Ambulatory Visit: Payer: Self-pay | Admitting: Neurology

## 2016-07-11 ENCOUNTER — Encounter: Payer: Self-pay | Admitting: Family Medicine

## 2016-07-11 ENCOUNTER — Ambulatory Visit (INDEPENDENT_AMBULATORY_CARE_PROVIDER_SITE_OTHER): Payer: PPO | Admitting: Family Medicine

## 2016-07-11 VITALS — BP 122/82 | HR 81 | Temp 98.0°F | Resp 16 | Ht 64.0 in | Wt 126.4 lb

## 2016-07-11 DIAGNOSIS — M8580 Other specified disorders of bone density and structure, unspecified site: Secondary | ICD-10-CM | POA: Diagnosis not present

## 2016-07-11 DIAGNOSIS — Z23 Encounter for immunization: Secondary | ICD-10-CM | POA: Diagnosis not present

## 2016-07-11 DIAGNOSIS — Z0000 Encounter for general adult medical examination without abnormal findings: Secondary | ICD-10-CM | POA: Diagnosis not present

## 2016-07-11 DIAGNOSIS — Z1159 Encounter for screening for other viral diseases: Secondary | ICD-10-CM | POA: Diagnosis not present

## 2016-07-11 DIAGNOSIS — I10 Essential (primary) hypertension: Secondary | ICD-10-CM

## 2016-07-11 LAB — CBC WITH DIFFERENTIAL/PLATELET
BASOS ABS: 0 10*3/uL (ref 0.0–0.1)
Basophils Relative: 0.4 % (ref 0.0–3.0)
EOS ABS: 0.1 10*3/uL (ref 0.0–0.7)
Eosinophils Relative: 2 % (ref 0.0–5.0)
HCT: 41.4 % (ref 36.0–46.0)
Hemoglobin: 14.2 g/dL (ref 12.0–15.0)
LYMPHS ABS: 1.7 10*3/uL (ref 0.7–4.0)
LYMPHS PCT: 30.4 % (ref 12.0–46.0)
MCHC: 34.4 g/dL (ref 30.0–36.0)
MCV: 97 fl (ref 78.0–100.0)
MONOS PCT: 10.1 % (ref 3.0–12.0)
Monocytes Absolute: 0.6 10*3/uL (ref 0.1–1.0)
NEUTROS PCT: 57.1 % (ref 43.0–77.0)
Neutro Abs: 3.3 10*3/uL (ref 1.4–7.7)
Platelets: 214 10*3/uL (ref 150.0–400.0)
RBC: 4.27 Mil/uL (ref 3.87–5.11)
RDW: 13.4 % (ref 11.5–15.5)
WBC: 5.7 10*3/uL (ref 4.0–10.5)

## 2016-07-11 LAB — HEPATIC FUNCTION PANEL
ALBUMIN: 4.9 g/dL (ref 3.5–5.2)
ALK PHOS: 50 U/L (ref 39–117)
ALT: 26 U/L (ref 0–35)
AST: 14 U/L (ref 0–37)
Bilirubin, Direct: 0.1 mg/dL (ref 0.0–0.3)
TOTAL PROTEIN: 7.5 g/dL (ref 6.0–8.3)
Total Bilirubin: 0.6 mg/dL (ref 0.2–1.2)

## 2016-07-11 LAB — LIPID PANEL
CHOLESTEROL: 239 mg/dL — AB (ref 0–200)
HDL: 122.2 mg/dL (ref >39.00)
LDL CALC: 102 mg/dL — AB (ref 0–99)
NonHDL: 116.34
TRIGLYCERIDES: 73 mg/dL (ref 0.0–149.0)
Total CHOL/HDL Ratio: 2
VLDL: 14.6 mg/dL (ref 0.0–40.0)

## 2016-07-11 LAB — BASIC METABOLIC PANEL
BUN: 18 mg/dL (ref 6–23)
CALCIUM: 9.9 mg/dL (ref 8.4–10.5)
CO2: 31 mEq/L (ref 19–32)
Chloride: 98 mEq/L (ref 96–112)
Creatinine, Ser: 0.71 mg/dL (ref 0.40–1.20)
GFR: 86.46 mL/min (ref >60.00)
GLUCOSE: 84 mg/dL (ref 70–99)
Potassium: 3.4 mEq/L — ABNORMAL LOW (ref 3.5–5.1)
Sodium: 139 mEq/L (ref 135–145)

## 2016-07-11 LAB — VITAMIN D 25 HYDROXY (VIT D DEFICIENCY, FRACTURES): VITD: 47.11 ng/mL (ref 30.00–100.00)

## 2016-07-11 LAB — TSH: TSH: 3.56 u[IU]/mL (ref 0.35–4.50)

## 2016-07-11 NOTE — Patient Instructions (Signed)
Follow up in 1 year or as needed We'll notify you of your lab results and make any changes if needed You are up to date on colonoscopy, mammo, immunizations- yay!! You got your flu shot today Keep up the good work on healthy diet and regular exercise- you look great! Call with any questions or concerns Hang in there!!!

## 2016-07-11 NOTE — Progress Notes (Signed)
   Subjective:    Patient ID: [name], female    DOB: 06/16/1946, 70 y.o.   MRN: [id number]  HPI Here today for CPE.  Risk Factors: Osteopenia- chronic problem, on Ca and Vit D daily.  UTD on DEXA.  Due for repeat Vit D HTN- chronic problem, on Triamterene HCTZ daily w/ good control Physical Activity: very active Fall Risk: low risk Depression: denies current sxs Hearing: normal to conversational tones and whispered voice at 6 ft ADL's: independent Cognitive: normal linear thought process, memory and attention intact Home Safety: safe at home Height, Weight, BMI, Visual Acuity: see vitals, vision corrected to 20/20 w/ glasses Counseling: UTD on colonoscopy, mammo, DEXA, immunizations.  Getting flu shot today Care team reviewed and updated w/ pt Labs Ordered: See A&P Care Plan: See A&P    Review of Systems Patient reports no vision/ hearing changes, adenopathy,fever, weight change,  persistant/recurrent hoarseness , swallowing issues, chest pain, palpitations, edema, persistant/recurrent cough, hemoptysis, dyspnea (rest/exertional/paroxysmal nocturnal), gastrointestinal bleeding (melena, rectal bleeding), abdominal pain, significant heartburn, bowel changes, GU symptoms (dysuria, hematuria, incontinence), Gyn symptoms (abnormal  bleeding, pain),  syncope, focal weakness, memory loss, numbness & tingling, skin/hair/nail changes, abnormal bruising or bleeding, anxiety, or depression.     Objective:   Physical Exam General Appearance:    Alert, cooperative, no distress, appears stated age  Head:    Normocephalic, without obvious abnormality, atraumatic  Eyes:    PERRL, conjunctiva/corneas clear, EOM's intact, fundi    benign, both eyes  Ears:    Normal TM's and external ear canals, both ears  Nose:   Nares normal, septum midline, mucosa normal, no drainage    or sinus tenderness  Throat:   Lips, mucosa, and tongue normal; teeth and gums normal  Neck:   Supple, symmetrical,  trachea midline, no adenopathy;    Thyroid: no enlargement/tenderness/nodules  Back:     Symmetric, no curvature, ROM normal, no CVA tenderness  Lungs:     Clear to auscultation bilaterally, respirations unlabored  Chest Wall:    No tenderness or deformity   Heart:    Regular rate and rhythm, S1 and S2 normal, no murmur, rub   or gallop  Breast Exam:    Deferred to GYN  Abdomen:     Soft, non-tender, bowel sounds active all four quadrants,    no masses, no organomegaly  Genitalia:    Deferred to GYN  Rectal:    Extremities:   Extremities normal, atraumatic, no cyanosis or edema  Pulses:   2+ and symmetric all extremities  Skin:   Skin color, texture, turgor normal, no rashes or lesions  Lymph nodes:   Cervical, supraclavicular, and axillary nodes normal  Neurologic:   CNII-XII intact, normal strength, sensation and reflexes    throughout          Assessment & Plan:  CPE- PE WNL.  UTD on colonoscopy, mammo, DEXA.  Flu shot given today.  Written screening schedule updated and given to pt. Check labs.  Anticipatory guidance provided.   Osteopenia-Chronic problem.  UTD on DEXA.  On Ca and Vit D daily.  Check Vit D level replete prn   HTN- BP is currently well controlled on Triamterene HCTZ daily.  Asymptomatic.  Check labs.  No anticipated med changes.

## 2016-07-11 NOTE — Progress Notes (Signed)
Pre visit review using our clinic review tool, if applicable. No additional management support is needed unless otherwise documented below in the visit note. 

## 2016-07-12 LAB — HEPATITIS C ANTIBODY: HCV AB: NEGATIVE

## 2016-08-04 ENCOUNTER — Other Ambulatory Visit: Payer: Self-pay | Admitting: Neurology

## 2016-09-19 ENCOUNTER — Other Ambulatory Visit: Payer: Self-pay | Admitting: Nurse Practitioner

## 2016-09-19 DIAGNOSIS — Z9849 Cataract extraction status, unspecified eye: Secondary | ICD-10-CM | POA: Diagnosis not present

## 2016-09-19 DIAGNOSIS — Z961 Presence of intraocular lens: Secondary | ICD-10-CM | POA: Diagnosis not present

## 2016-10-01 ENCOUNTER — Ambulatory Visit: Payer: PPO | Admitting: Nurse Practitioner

## 2016-10-08 ENCOUNTER — Encounter: Payer: Self-pay | Admitting: Nurse Practitioner

## 2016-10-08 ENCOUNTER — Ambulatory Visit (INDEPENDENT_AMBULATORY_CARE_PROVIDER_SITE_OTHER): Payer: PPO | Admitting: Nurse Practitioner

## 2016-10-08 VITALS — BP 124/70 | HR 72 | Ht 64.0 in | Wt 128.6 lb

## 2016-10-08 DIAGNOSIS — F409 Phobic anxiety disorder, unspecified: Secondary | ICD-10-CM | POA: Diagnosis not present

## 2016-10-08 DIAGNOSIS — F5105 Insomnia due to other mental disorder: Secondary | ICD-10-CM | POA: Diagnosis not present

## 2016-10-08 DIAGNOSIS — G4700 Insomnia, unspecified: Secondary | ICD-10-CM

## 2016-10-08 MED ORDER — TIZANIDINE HCL 4 MG PO TABS: 4.0000 mg | ORAL_TABLET | Freq: Every day | ORAL | 3 refills | Status: DC

## 2016-10-08 MED ORDER — QUETIAPINE FUMARATE 25 MG PO TABS: ORAL_TABLET | ORAL | 3 refills | Status: DC

## 2016-10-08 NOTE — Patient Instructions (Signed)
Continue Seroquel at current dose will refill Continue Zanaflex  At current dose  F/U yearly   

## 2016-10-08 NOTE — Progress Notes (Signed)
[facility]  PATIENT: KEILANA MAYERHOFER DOB: 1946-02-19   REASON FOR VISIT: Follow-up for insomnia HISTORY FROM: Patient    HISTORY OF PRESENT ILLNESS:Curtistine D Kapela is a 70 y.o. female Is seen here as a yearly revisit from Dr. Linna Darner for chronic insomnia, treated with Seroquel .  Marland KitchenShe reports cyclic insomnia, and related to stressful situation. Her father was an alcoholic and air Customer service manager. The patient moved a lot with her family and has had lifelong fear of going to sleep, related to her experiences with an intoxicated father coming home and being belligerent, but she was never physically abused. Marland Kitchen  She was born on [place], her brother in [place] and her sister in [place]. Tizanidine has helped for her symptoms of cervical spine disease and it also helps her to sleep. She has a history of chronic insomnia and is currently on Seroquel doing well on that medication she continues to exercise daily. She has no new neurologic complaints   REVIEW OF SYSTEMS: Full 14 system review of systems performed and notable only for those listed, all others are neg:  Constitutional: neg  Cardiovascular: neg Ear/Nose/Throat: neg  Skin: neg Eyes: neg Respiratory: neg Gastroitestinal: neg  Hematology/Lymphatic: neg  Endocrine: neg Musculoskeletal:neg Allergy/Immunology: neg Neurological: neg Psychiatric: neg Sleep : Insomnia   ALLERGIES: Allergies  Allergen Reactions  . Other     OPIODS  . Betadine [Povidone Iodine] Other (See Comments)    Showed up with allergy testing, unknown reaction  . Codeine Nausea And Vomiting  . Formaldehyde Other (See Comments)    Allergy testing, unknown reaction    HOME MEDICATIONS: Outpatient Medications Prior to Visit  Medication Sig Dispense Refill  . Calcium Carbonate-Vitamin D (CALCIUM + D PO) Take 1 tablet by mouth daily.     . cetirizine (ZYRTEC) 10 MG tablet Take 1 tablet (10 mg total) by mouth daily. 30 tablet 11  .  Evening Primrose Oil CAPS Take 2 capsules by mouth daily.    Marland Kitchen GLUCOSAMINE PO Take by mouth.    . Multiple Vitamin (MULTIVITAMIN) capsule Take 1 capsule by mouth daily.    . Omega-3 Fatty Acids (OMEGA 3 PO) Take by mouth.    . QUEtiapine (SEROQUEL) 25 MG tablet TAKE 3 TABLETS(75 MG) BY MOUTH AT BEDTIME 270 tablet 3  . tiZANidine (ZANAFLEX) 4 MG tablet TAKE 1 TABLET BY MOUTH EVERY NIGHT AT BEDTIME 90 tablet 0  . triamterene-hydrochlorothiazide (DYAZIDE) 37.5-25 MG capsule Take 1 each (1 capsule total) by mouth every morning. 30 capsule 11   No facility-administered medications prior to visit.     PAST MEDICAL HISTORY: Past Medical History:  Diagnosis Date  . Arthritis    Dr Estanislado Pandy  . Cataract   . GERD (gastroesophageal reflux disease)   . Insomnia due to anxiety and fear   . Snoring 09/29/2014    PAST SURGICAL HISTORY: Past Surgical History:  Procedure Laterality Date  . ABDOMINAL HYSTERECTOMY  1987   TAH,BSO, APPENDECTOMY  . APPENDECTOMY  1987   APPENDECTOMY AT TAH,BSO  . COLONOSCOPY      X 3; [facility]. All negative  . KNEE ARTHROSCOPY Bilateral   . REPLACEMENT TOTAL KNEE Bilateral    X 1 each; Dr Percell Miller  . ROTATOR CUFF REPAIR Right 2010    FAMILY HISTORY: Family History  Problem Relation Age of Onset  . Ovarian cancer Mother 60  . Alcohol abuse Father   . Cirrhosis Father   . Stroke Sister 17  .  Diabetes Paternal Grandmother   . Heart disease Neg Hx   . Hypertension Neg Hx   . Hyperlipidemia Neg Hx   . Colon cancer Neg Hx     SOCIAL HISTORY: Social History   Social History  . Marital status: Married    Spouse name: N/A  . Number of children: 0  . Years of education: N/A   Occupational History  . retired Not Employed   Social History Main Topics  . Smoking status: Never Smoker  . Smokeless tobacco: Never Used  . Alcohol use 2.0 oz/week    4 Standard drinks or equivalent per week  . Drug use: No  . Sexual activity: Yes    Birth control/  protection: Surgical     Comment: not much   Other Topics Concern  . Not on file   Social History Narrative  . No narrative on file     PHYSICAL EXAM  Vitals:   10/08/16 1331  BP: 124/70  Pulse: 72  Weight: 128 lb 9.6 oz (58.3 kg)  Height: 5\' 4"  (1.626 m)   Body mass index is 22.07 kg/m. General: well developed, well nourished, seated, in no evident distress Head: head normocephalic and atraumatic. Oropharynx benign Neck: supple with no carotid or supraclavicular bruits Cardiovascular: regular rate and rhythm, no murmurs Musculoskeletal skeletal changes in the hands fingers shoulders and the knees  Neurologic Exam Mental Status: Awake and fully alert. Oriented to place and time. Follows all commands Mood and affect appropriate. ESS 1. FSS 10 Cranial Nerves:Pupils equal, briskly reactive to light. Extraocular movements full without nystagmus. Visual fields full to confrontation. Hearing intact and symmetric to finger snap. Facial sensation intact. Face, tongue, palate move normally and symmetrically. Neck flexion and extension normal.  Motor: Normal bulk and tone. Normal strength in all tested extremity muscles. No focal weakness Sensory.: intact to touch and pinprick and vibratory.  Coordination: Rapid alternating movements normal in all extremities. Finger-to-nose and heel-to-shin performed accurately bilaterally. Gait and Station: Arises from chair without difficulty. Stance is normal. Gait demonstrates normal stride length and balance . Able to heel, toe and tandem walk without difficulty.  Reflexes: 1+ and symmetric. Toes downgoing.  DIAGNOSTIC DATA (LABS, IMAGING, TESTING) -  ASSESSMENT AND PLAN  70 y.o. year old female  has a past medical history of Arthritis;  Insomnia due to anxiety and fear; and Snoring (09/29/2014). here to follow up.  Continue Seroquel at current dose will refill Continue Zanaflex  At current dose  F/U yearly  Dennie Bible, [facility],  [facility], APRN  [facility] [address], [place], [place] [address] [phone]

## 2016-10-09 NOTE — Progress Notes (Signed)
I agree with the assessment and plan as directed by NP .The patient is known to me .   Reika Callanan, MD  

## 2016-10-22 DIAGNOSIS — T148XXA Other injury of unspecified body region, initial encounter: Secondary | ICD-10-CM

## 2016-10-22 HISTORY — DX: Other injury of unspecified body region, initial encounter: T14.8XXA

## 2016-11-02 ENCOUNTER — Other Ambulatory Visit: Payer: Self-pay | Admitting: Neurology

## 2016-11-20 DIAGNOSIS — M25572 Pain in left ankle and joints of left foot: Secondary | ICD-10-CM | POA: Diagnosis not present

## 2016-11-20 DIAGNOSIS — M25561 Pain in right knee: Secondary | ICD-10-CM | POA: Diagnosis not present

## 2016-11-20 DIAGNOSIS — M25562 Pain in left knee: Secondary | ICD-10-CM | POA: Diagnosis not present

## 2016-12-18 DIAGNOSIS — S82832A Other fracture of upper and lower end of left fibula, initial encounter for closed fracture: Secondary | ICD-10-CM | POA: Diagnosis not present

## 2017-01-09 ENCOUNTER — Encounter: Payer: Self-pay | Admitting: Women's Health

## 2017-01-09 DIAGNOSIS — Z1231 Encounter for screening mammogram for malignant neoplasm of breast: Secondary | ICD-10-CM | POA: Diagnosis not present

## 2017-01-30 ENCOUNTER — Encounter: Payer: Self-pay | Admitting: Women's Health

## 2017-01-30 ENCOUNTER — Ambulatory Visit (INDEPENDENT_AMBULATORY_CARE_PROVIDER_SITE_OTHER): Payer: PPO | Admitting: Women's Health

## 2017-01-30 VITALS — BP 132/82 | Ht 63.5 in | Wt 131.4 lb

## 2017-01-30 DIAGNOSIS — M8580 Other specified disorders of bone density and structure, unspecified site: Secondary | ICD-10-CM

## 2017-01-30 DIAGNOSIS — M8588 Other specified disorders of bone density and structure, other site: Secondary | ICD-10-CM

## 2017-01-30 DIAGNOSIS — Z01419 Encounter for gynecological examination (general) (routine) without abnormal findings: Secondary | ICD-10-CM

## 2017-01-30 MED ORDER — TRIAMTERENE-HCTZ 37.5-25 MG PO CAPS: 1.0000 | ORAL_CAPSULE | Freq: Every morning | ORAL | 4 refills | Status: DC

## 2017-01-30 NOTE — Patient Instructions (Signed)
Health Maintenance for Postmenopausal Women Menopause is a normal process in which your reproductive ability comes to an end. This process happens gradually over a span of months to years, usually between the ages of 33 and 38. Menopause is complete when you have missed 12 consecutive menstrual periods. It is important to talk with your health care provider about some of the most common conditions that affect postmenopausal women, such as heart disease, cancer, and bone loss (osteoporosis). Adopting a healthy lifestyle and getting preventive care can help to promote your health and wellness. Those actions can also lower your chances of developing some of these common conditions. What should I know about menopause? During menopause, you may experience a number of symptoms, such as:  Moderate-to-severe hot flashes.  Night sweats.  Decrease in sex drive.  Mood swings.  Headaches.  Tiredness.  Irritability.  Memory problems.  Insomnia. Choosing to treat or not to treat menopausal changes is an individual decision that you make with your health care provider. What should I know about hormone replacement therapy and supplements? Hormone therapy products are effective for treating symptoms that are associated with menopause, such as hot flashes and night sweats. Hormone replacement carries certain risks, especially as you become older. If you are thinking about using estrogen or estrogen with progestin treatments, discuss the benefits and risks with your health care provider. What should I know about heart disease and stroke? Heart disease, heart attack, and stroke become more likely as you age. This may be due, in part, to the hormonal changes that your body experiences during menopause. These can affect how your body processes dietary fats, triglycerides, and cholesterol. Heart attack and stroke are both medical emergencies. There are many things that you can do to help prevent heart disease  and stroke:  Have your blood pressure checked at least every 1-2 years. High blood pressure causes heart disease and increases the risk of stroke.  If you are 48-61 years old, ask your health care provider if you should take aspirin to prevent a heart attack or a stroke.  Do not use any tobacco products, including cigarettes, chewing tobacco, or electronic cigarettes. If you need help quitting, ask your health care provider.  It is important to eat a healthy diet and maintain a healthy weight.  Be sure to include plenty of vegetables, fruits, low-fat dairy products, and lean protein.  Avoid eating foods that are high in solid fats, added sugars, or salt (sodium).  Get regular exercise. This is one of the most important things that you can do for your health.  Try to exercise for at least 150 minutes each week. The type of exercise that you do should increase your heart rate and make you sweat. This is known as moderate-intensity exercise.  Try to do strengthening exercises at least twice each week. Do these in addition to the moderate-intensity exercise.  Know your numbers.Ask your health care provider to check your cholesterol and your blood glucose. Continue to have your blood tested as directed by your health care provider. What should I know about cancer screening? There are several types of cancer. Take the following steps to reduce your risk and to catch any cancer development as early as possible. Breast Cancer  Practice breast self-awareness.  This means understanding how your breasts normally appear and feel.  It also means doing regular breast self-exams. Let your health care provider know about any changes, no matter how small.  If you are 40 or older,  have a clinician do a breast exam (clinical breast exam or CBE) every year. Depending on your age, family history, and medical history, it may be recommended that you also have a yearly breast X-ray (mammogram).  If you  have a family history of breast cancer, talk with your health care provider about genetic screening.  If you are at high risk for breast cancer, talk with your health care provider about having an MRI and a mammogram every year.  Breast cancer (BRCA) gene test is recommended for women who have family members with BRCA-related cancers. Results of the assessment will determine the need for genetic counseling and BRCA1 and for BRCA2 testing. BRCA-related cancers include these types:  Breast. This occurs in males or females.  Ovarian.  Tubal. This may also be called fallopian tube cancer.  Cancer of the abdominal or pelvic lining (peritoneal cancer).  Prostate.  Pancreatic. Cervical, Uterine, and Ovarian Cancer  Your health care provider may recommend that you be screened regularly for cancer of the pelvic organs. These include your ovaries, uterus, and vagina. This screening involves a pelvic exam, which includes checking for microscopic changes to the surface of your cervix (Pap test).  For women ages 21-65, health care providers may recommend a pelvic exam and a Pap test every three years. For women ages 23-65, they may recommend the Pap test and pelvic exam, combined with testing for human papilloma virus (HPV), every five years. Some types of HPV increase your risk of cervical cancer. Testing for HPV may also be done on women of any age who have unclear Pap test results.  Other health care providers may not recommend any screening for nonpregnant women who are considered low risk for pelvic cancer and have no symptoms. Ask your health care provider if a screening pelvic exam is right for you.  If you have had past treatment for cervical cancer or a condition that could lead to cancer, you need Pap tests and screening for cancer for at least 20 years after your treatment. If Pap tests have been discontinued for you, your risk factors (such as having a new sexual partner) need to be reassessed  to determine if you should start having screenings again. Some women have medical problems that increase the chance of getting cervical cancer. In these cases, your health care provider may recommend that you have screening and Pap tests more often.  If you have a family history of uterine cancer or ovarian cancer, talk with your health care provider about genetic screening.  If you have vaginal bleeding after reaching menopause, tell your health care provider.  There are currently no reliable tests available to screen for ovarian cancer. Lung Cancer  Lung cancer screening is recommended for adults 99-83 years old who are at high risk for lung cancer because of a history of smoking. A yearly low-dose CT scan of the lungs is recommended if you:  Currently smoke.  Have a history of at least 30 pack-years of smoking and you currently smoke or have quit within the past 15 years. A pack-year is smoking an average of one pack of cigarettes per day for one year. Yearly screening should:  Continue until it has been 15 years since you quit.  Stop if you develop a health problem that would prevent you from having lung cancer treatment. Colorectal Cancer  This type of cancer can be detected and can often be prevented.  Routine colorectal cancer screening usually begins at age 72 and continues  through age 75.  If you have risk factors for colon cancer, your health care provider may recommend that you be screened at an earlier age.  If you have a family history of colorectal cancer, talk with your health care provider about genetic screening.  Your health care provider may also recommend using home test kits to check for hidden blood in your stool.  A small camera at the end of a tube can be used to examine your colon directly (sigmoidoscopy or colonoscopy). This is done to check for the earliest forms of colorectal cancer.  Direct examination of the colon should be repeated every 5-10 years until  age 75. However, if early forms of precancerous polyps or small growths are found or if you have a family history or genetic risk for colorectal cancer, you may need to be screened more often. Skin Cancer  Check your skin from head to toe regularly.  Monitor any moles. Be sure to tell your health care provider:  About any new moles or changes in moles, especially if there is a change in a mole's shape or color.  If you have a mole that is larger than the size of a pencil eraser.  If any of your family members has a history of skin cancer, especially at a young age, talk with your health care provider about genetic screening.  Always use sunscreen. Apply sunscreen liberally and repeatedly throughout the day.  Whenever you are outside, protect yourself by wearing long sleeves, pants, a wide-brimmed hat, and sunglasses. What should I know about osteoporosis? Osteoporosis is a condition in which bone destruction happens more quickly than new bone creation. After menopause, you may be at an increased risk for osteoporosis. To help prevent osteoporosis or the bone fractures that can happen because of osteoporosis, the following is recommended:  If you are 19-50 years old, get at least 1,000 mg of calcium and at least 600 mg of vitamin D per day.  If you are older than age 50 but younger than age 70, get at least 1,200 mg of calcium and at least 600 mg of vitamin D per day.  If you are older than age 70, get at least 1,200 mg of calcium and at least 800 mg of vitamin D per day. Smoking and excessive alcohol intake increase the risk of osteoporosis. Eat foods that are rich in calcium and vitamin D, and do weight-bearing exercises several times each week as directed by your health care provider. What should I know about how menopause affects my mental health? Depression may occur at any age, but it is more common as you become older. Common symptoms of depression include:  Low or sad  mood.  Changes in sleep patterns.  Changes in appetite or eating patterns.  Feeling an overall lack of motivation or enjoyment of activities that you previously enjoyed.  Frequent crying spells. Talk with your health care provider if you think that you are experiencing depression. What should I know about immunizations? It is important that you get and maintain your immunizations. These include:  Tetanus, diphtheria, and pertussis (Tdap) booster vaccine.  Influenza every year before the flu season begins.  Pneumonia vaccine.  Shingles vaccine. Your health care provider may also recommend other immunizations. This information is not intended to replace advice given to you by your health care provider. Make sure you discuss any questions you have with your health care provider. Document Released: 11/30/2005 Document Revised: 04/27/2016 Document Reviewed: 07/12/2015 Elsevier Interactive Patient   Education  2017 [employer]  

## 2017-01-30 NOTE — Progress Notes (Signed)
[name] 02/10/1946 [id number]    History:    Presents for breast and pelvic exam with no complaints. 1987 TAH with BSO for endometriosis. Postmenopausal/no HRT/not sexually active husband's health. 2016 T score -1.8 FRAX 10.2%/1.8% stable from previous DEXA in 2014. Normal pap and mammogram history. Current on vaccines. 2014 normal colonoscopy. Left ankle fracture Jan 2018 from fall on cement. Rheumatoid arthritis, bilateral knee replacements.  Past medical history, past surgical history, family history and social history were all reviewed and documented in the EPIC chart. Retired Education officer, [employer]. Works with a Physiological scientist 5 days a week. Has horses.  ROS:  A ROS was performed and pertinent positives and negatives are included.  Exam:  Vitals:   01/30/17 1512  BP: 132/82  Weight: 131 lb 6.4 oz (59.6 kg)  Height: 5' 3.5" (1.613 m)   Body mass index is 22.91 kg/m.   General appearance:  Normal Thyroid:  Symmetrical, normal in size, without palpable masses or nodularity. Respiratory  Auscultation:  Clear without wheezing or rhonchi Cardiovascular  Auscultation:  Regular rate, without rubs, murmurs or gallops  Edema/varicosities:  Not grossly evident Abdominal  Soft,nontender, without masses, guarding or rebound.  Liver/spleen:  No organomegaly noted  Hernia:  None appreciated  Skin  Inspection:  Grossly normal   Breasts: Examined lying and sitting.     Right: Without masses, retractions, discharge or axillary adenopathy.     Left: Without masses, retractions, discharge or axillary adenopathy. Gentitourinary   Inguinal/mons:  Normal without inguinal adenopathy  External genitalia:  Normal  BUS/Urethra/Skene's glands:  Normal  Vagina:  Atrophy   Cervix:  Absent  Uterus:  Absent  Adnexa/parametria:     Rt: Without masses or tenderness.   Lt: Without masses or tenderness.  Anus and perineum: Normal  Digital rectal exam: Normal sphincter tone without palpated masses  or tenderness  Assessment/Plan:  71 y.o.  MWF G0 for breast and pelvic exam with no complaints.   TAH with BSO for endometriosis/no HRT Osteopenia without elevated FRAX Rheumatoid arthritis-primary care manages Calcium in urine-Dr. Gale Journey at [facility] Chronic Insomnia-primary care manages  Plan: SBE's, continue annual 3-D screening mammogram history of dense breasts. Encouraged DEXA scan every 2 years, as had done [facility]. Home safety, fall prevention and importance of continuing weightbearing exercises reviewed. Continues to work with a trainer several days a week and exercising 5 days weekly. Dyazide 37.5-25 mg tablets daily as recommended by Dr. Gale Journey prescription given, has follow-up every other year but was instructed to take daily.  [facility], 3:39 PM 01/30/2017

## 2017-03-07 ENCOUNTER — Ambulatory Visit (INDEPENDENT_AMBULATORY_CARE_PROVIDER_SITE_OTHER): Payer: PPO | Admitting: Physician Assistant

## 2017-03-07 ENCOUNTER — Encounter: Payer: Self-pay | Admitting: Physician Assistant

## 2017-03-07 VITALS — BP 108/72 | HR 73 | Temp 98.6°F | Resp 14 | Ht 64.0 in | Wt 132.0 lb

## 2017-03-07 DIAGNOSIS — B9789 Other viral agents as the cause of diseases classified elsewhere: Secondary | ICD-10-CM

## 2017-03-07 DIAGNOSIS — J069 Acute upper respiratory infection, unspecified: Secondary | ICD-10-CM | POA: Diagnosis not present

## 2017-03-07 MED ORDER — BENZONATATE 100 MG PO CAPS: 100.0000 mg | ORAL_CAPSULE | Freq: Two times a day (BID) | ORAL | 0 refills | Status: DC | PRN

## 2017-03-07 NOTE — Progress Notes (Signed)
Pre visit review using our clinic review tool, if applicable. No additional management support is needed unless otherwise documented below in the visit note. 

## 2017-03-07 NOTE — Patient Instructions (Signed)
Stay well-hydrated and get plenty of rest.  Place a humidifier in the bedroom.  Start the tessalon as directed for cough. Get saline nasal rinse to flush out nasal passages.   Call or return if symptoms are not improving.    Viral Respiratory Infection A viral respiratory infection is an illness that affects parts of the body used for breathing, like the lungs, nose, and throat. It is caused by a germ called a virus. Some examples of this kind of infection are:  A cold.  The flu (influenza).  A respiratory syncytial virus (RSV) infection. How do I know if I have this infection? Most of the time this infection causes:  A stuffy or runny nose.  Yellow or green fluid in the nose.  A cough.  Sneezing.  Tiredness (fatigue).  Achy muscles.  A sore throat.  Sweating or chills.  A fever.  A headache. How is this infection treated? If the flu is diagnosed early, it may be treated with an antiviral medicine. This medicine shortens the length of time a person has symptoms. Symptoms may be treated with over-the-counter and prescription medicines, such as:  Expectorants. These make it easier to cough up mucus.  Decongestant nasal sprays. Doctors do not prescribe antibiotic medicines for viral infections. They do not work with this kind of infection. How do I know if I should stay home? To keep others from getting sick, stay home if you have:  A fever.  A lasting cough.  A sore throat.  A runny nose.  Sneezing.  Muscles aches.  Headaches.  Tiredness.  Weakness.  Chills.  Sweating.  An upset stomach (nausea). Follow these instructions at home:  Rest as much as possible.  Take over-the-counter and prescription medicines only as told by your doctor.  Drink enough fluid to keep your pee (urine) clear or pale yellow.  Gargle with salt water. Do this 3-4 times per day or as needed. To make a salt-water mixture, dissolve -1 tsp of salt in 1 cup of warm  water. Make sure the salt dissolves all the way.  Use nose drops made from salt water. This helps with stuffiness (congestion). It also helps soften the skin around your nose.  Do not drink alcohol.  Do not use tobacco products, including cigarettes, chewing tobacco, and e-cigarettes. If you need help quitting, ask your doctor. Get help if:  Your symptoms last for 10 days or longer.  Your symptoms get worse over time.  You have a fever.  You have very bad pain in your face or forehead.  Parts of your jaw or neck become very swollen. Get help right away if:  You feel pain or pressure in your chest.  You have shortness of breath.  You faint or feel like you will faint.  You keep throwing up (vomiting).  You feel confused. This information is not intended to replace advice given to you by your health care provider. Make sure you discuss any questions you have with your health care provider. Document Released: 09/20/2008 Document Revised: 03/15/2016 Document Reviewed: 03/16/2015 Elsevier Interactive Patient Education  2017 [employer].

## 2017-03-07 NOTE — Progress Notes (Signed)
Patient presents to clinic today c/o 3 days of scratchy throat, now with dry cough and chest tightness. Denies sinus pain or ear pain. Denies recent foreign travel but did go to [place] via plane last week. Denies history of asthma or COPD. Has taken OTC cold medication this morning for symptoms. Notes co-worker with the  Flu 2 weeks ago.   Past Medical History:  Diagnosis Date  . Arthritis    Dr Estanislado Pandy  . Cataract   . Fracture 10/2016   left ankel  . GERD (gastroesophageal reflux disease)   . Insomnia due to anxiety and fear   . Snoring 09/29/2014    Current Outpatient Prescriptions on File Prior to Visit  Medication Sig Dispense Refill  . Calcium Carbonate-Vitamin D (CALCIUM + D PO) Take 1 tablet by mouth daily.     . Evening Primrose Oil CAPS Take 2 capsules by mouth daily.    Marland Kitchen GLUCOSAMINE PO Take by mouth.    . Multiple Vitamin (MULTIVITAMIN) capsule Take 1 capsule by mouth daily.    . Omega-3 Fatty Acids (OMEGA 3 PO) Take by mouth.    . QUEtiapine (SEROQUEL) 25 MG tablet TAKE 3 TABLETS(75 MG) BY MOUTH AT BEDTIME 270 tablet 3  . tiZANidine (ZANAFLEX) 4 MG tablet Take 1 tablet (4 mg total) by mouth at bedtime. 90 tablet 3  . triamterene-hydrochlorothiazide (DYAZIDE) 37.5-25 MG capsule Take 1 each (1 capsule total) by mouth every morning. 90 capsule 4   No current facility-administered medications on file prior to visit.     Allergies  Allergen Reactions  . Other     OPIODS  . Betadine [Povidone Iodine] Other (See Comments)    Showed up with allergy testing, unknown reaction  . Codeine Nausea And Vomiting  . Formaldehyde Other (See Comments)    Allergy testing, unknown reaction    Family History  Problem Relation Age of Onset  . Ovarian cancer Mother 35  . Alcohol abuse Father   . Cirrhosis Father   . Stroke Sister 65  . Diabetes Paternal Grandmother   . Heart disease Neg Hx   . Hypertension Neg Hx   . Hyperlipidemia Neg Hx   . Colon cancer Neg Hx      Social History   Social History  . Marital status: Married    Spouse name: N/A  . Number of children: 0  . Years of education: N/A   Occupational History  . retired Not Employed   Social History Main Topics  . Smoking status: Never Smoker  . Smokeless tobacco: Never Used  . Alcohol use 2.0 oz/week    4 Standard drinks or equivalent per week  . Drug use: No  . Sexual activity: Not Currently    Birth control/ protection: Surgical     Comment: not much   Other Topics Concern  . None   Social History Narrative  . None   Review of Systems - See HPI.  All other ROS are negative.  BP 108/72   Pulse 73   Temp 98.6 F (37 C) (Oral)   Resp 14   Ht 5\' 4"  (1.626 m)   Wt 132 lb (59.9 kg)   SpO2 98%   BMI 22.66 kg/m   Physical Exam  Constitutional: She is oriented to person, place, and time and well-developed, well-nourished, and in no distress.  HENT:  Head: Normocephalic and atraumatic.  Right Ear: Tympanic membrane normal.  Left Ear: Tympanic membrane normal.  Nose: Right sinus exhibits  no maxillary sinus tenderness and no frontal sinus tenderness. Left sinus exhibits no maxillary sinus tenderness and no frontal sinus tenderness.  Mouth/Throat: Uvula is midline, oropharynx is clear and moist and mucous membranes are normal.  Eyes: Conjunctivae are normal.  Neck: Neck supple.  Cardiovascular: Normal rate, regular rhythm, normal heart sounds and intact distal pulses.   Pulmonary/Chest: Effort normal and breath sounds normal. No respiratory distress. She has no wheezes. She has no rales.  Neurological: She is alert and oriented to person, place, and time.  Skin: Skin is warm and dry. No rash noted.  Psychiatric: Affect normal.   Assessment/Plan: 1. Viral URI with cough Exam unremarkable. 3 days of mild symptoms. Start supportive measures. OTC medications reviewed. Rx Tessalon for cough. Return precautions reviewed with patient.    Leeanne Rio, PA-C

## 2017-03-11 ENCOUNTER — Telehealth: Payer: Self-pay | Admitting: Family Medicine

## 2017-03-11 MED ORDER — DOXYCYCLINE HYCLATE 100 MG PO CAPS: 100.0000 mg | ORAL_CAPSULE | Freq: Two times a day (BID) | ORAL | 0 refills | Status: DC

## 2017-03-11 NOTE — Telephone Encounter (Signed)
I will send her in a course of Doxycycline to take as directed. Giving allergies and hypertension, she can try Coricidin HBP or Delsym for cough.  Will need reassessment in office if not improving.

## 2017-03-11 NOTE — Telephone Encounter (Signed)
Spoke with patient about current symptoms She has been taking Tessalon pears for cough with no relief. Still coughing-non productive sputum, nasal congestion with yellowish congestion, really congested having to sleep elevated at night when laying down. No fever, chills or sob or wheezing  Please advise

## 2017-03-11 NOTE — Telephone Encounter (Signed)
Please call patient to assess current symptoms so we can determine next steps. Thank you.

## 2017-03-11 NOTE — Telephone Encounter (Signed)
Advised patient that rx for the Doxycycline has been sent to the pharmacy. Advised if the Tessalon pearls are not helping with cough, she can try Coricidin HBP or Delsym for the cough. She is agreeable

## 2017-03-11 NOTE — Telephone Encounter (Signed)
Patient calling to report she has not heard back from office regarding if rx was sent to pharmacy.  Advised patient a rx was indeed sent to pharmacy.  Did not advise patient of additional instructions previous noted by pcp, will routed message to CMA to advise patient.

## 2017-03-11 NOTE — Telephone Encounter (Signed)
Pt states that she is not feeling any better from Thursday visit and asking what should she do, Please advise.

## 2017-04-18 DIAGNOSIS — M8589 Other specified disorders of bone density and structure, multiple sites: Secondary | ICD-10-CM | POA: Diagnosis not present

## 2017-04-18 DIAGNOSIS — M810 Age-related osteoporosis without current pathological fracture: Secondary | ICD-10-CM | POA: Diagnosis not present

## 2017-04-29 ENCOUNTER — Encounter: Payer: Self-pay | Admitting: Women's Health

## 2017-05-06 ENCOUNTER — Telehealth: Payer: Self-pay | Admitting: *Deleted

## 2017-05-06 NOTE — Telephone Encounter (Signed)
Pt called to follow up bone density results "Please call and inform, bone strength has weakened. Osteopenia still but worsening and fracture risk has increased. She exercises regularly but may want to consider medication to help slow the loss, have her sched appt to discuss fosamax."  Pt spoke with Dr. Gale Journey and was informed to hold off until Oct, pt wanted our office to know this.

## 2017-07-16 NOTE — Progress Notes (Addendum)
Subjective:   [name] is a 71 y.o. female who presents for Medicare Annual (Subsequent) preventive examination.  Review of Systems:  No ROS.  Medicare Wellness Visit. Additional risk factors are reflected in the social history.  Cardiac Risk Factors include: family history of premature cardiovascular disease;advanced age (>64men, >54 women)   Sleep patterns: Sleeps 6-7 hours, takes Seroquel.  Home Safety/Smoke Alarms: Feels safe in home. Smoke alarms in place.  Living environment; residence and Firearm Safety: Lives with husband in 2 story home.  Seat Belt Safety/Bike Helmet: Wears seat belt.   Female:   [id number]       Mammo- 01/09/2017, negative.       Dexa scan-04/29/2017, Osteopenia.         CCS-Colonoscopy 03/10/2013, normal. Recall 10 years.      Objective:     Vitals: BP (!) 142/80 (BP Location: Left Arm, Patient Position: Sitting, Cuff Size: Normal)   Pulse 76   Ht 5\' 4"  (1.626 m)   Wt 134 lb (60.8 kg)   SpO2 98%   BMI 23.00 kg/m   Body mass index is 23 kg/m.   Tobacco History  Smoking Status  . Never Smoker  Smokeless Tobacco  . Never Used     Counseling given: Not Answered   Past Medical History:  Diagnosis Date  . Arthritis    Dr Estanislado Pandy  . Cataract   . Fracture 10/2016   left ankel  . GERD (gastroesophageal reflux disease)   . Insomnia due to anxiety and fear   . Snoring 09/29/2014   Past Surgical History:  Procedure Laterality Date  . ABDOMINAL HYSTERECTOMY  1987   TAH,BSO, APPENDECTOMY  . APPENDECTOMY  1987   APPENDECTOMY AT [facility],BSO  . COLONOSCOPY      X 3; [facility]. All negative  . KNEE ARTHROSCOPY Bilateral   . REPLACEMENT TOTAL KNEE Bilateral    X 1 each; Dr Percell Miller  . ROTATOR CUFF REPAIR Right 2010   Family History  Problem Relation Age of Onset  . Ovarian cancer Mother 59  . Alcohol abuse Father   . Cirrhosis Father   . Stroke Sister 1  . Diabetes Paternal Grandmother   . Heart disease Neg Hx   . Hypertension  Neg Hx   . Hyperlipidemia Neg Hx   . Colon cancer Neg Hx    History  Sexual Activity  . Sexual activity: Not Currently  . Birth control/ protection: Surgical    Comment: not much    Outpatient Encounter Prescriptions as of 07/17/2017  Medication Sig  . Calcium Carbonate-Vitamin D (CALCIUM + D PO) Take 1 tablet by mouth daily.   . Cholecalciferol (VITAMIN D PO) Take by mouth.  . Evening Primrose Oil CAPS Take 2 capsules by mouth daily.  Marland Kitchen GLUCOSAMINE PO Take by mouth.  Marland Kitchen MILK THISTLE PO Take by mouth.  . Multiple Vitamin (MULTIVITAMIN) capsule Take 1 capsule by mouth daily.  . Omega-3 Fatty Acids (OMEGA 3 PO) Take by mouth.  . QUEtiapine (SEROQUEL) 25 MG tablet TAKE 3 TABLETS(75 MG) BY MOUTH AT BEDTIME  . tiZANidine (ZANAFLEX) 4 MG tablet Take 1 tablet (4 mg total) by mouth at bedtime.  . triamterene-hydrochlorothiazide (DYAZIDE) 37.5-25 MG capsule Take 1 each (1 capsule total) by mouth every morning.  Marland Kitchen doxycycline (VIBRAMYCIN) 100 MG capsule Take 1 capsule (100 mg total) by mouth 2 (two) times daily. (Patient not taking: Reported on 07/17/2017)   No facility-administered encounter medications on file as of 07/17/2017.  Activities of Daily Living In your present state of health, do you have any difficulty performing the following activities: 07/17/2017  Hearing? N  Vision? N  Difficulty concentrating or making decisions? N  Walking or climbing stairs? N  Dressing or bathing? N  Doing errands, shopping? N  Preparing Food and eating ? N  Using the Toilet? N  In the past six months, have you accidently leaked urine? N  Do you have problems with loss of bowel control? N  Managing your Medications? N  Managing your Finances? N  Housekeeping or managing your Housekeeping? N  Some recent data might be hidden    Patient Care Team: Midge Minium, MD as PCP - General (Family Medicine) Huel Cote, NP as Nurse Practitioner (Obstetrics and Gynecology) Dennie Bible, NP as Nurse Practitioner (Family Medicine) Dohmeier, Asencion Partridge, MD as Consulting Physician (Neurology) Gale Journey Gwen Her, MD as Referring Physician (Endocrinology) Kelton Pillar, MD as Referring Physician (Orthopedic Surgery)    Assessment:    Physical assessment deferred to PCP.  Exercise Activities and Dietary recommendations Current Exercise Habits: Structured exercise class, Type of exercise: yoga;strength training/weights;stretching, Time (Minutes): > 60, Frequency (Times/Week): 6, Weekly Exercise (Minutes/Week): 0, Exercise limited by: None identified   Diet (meal preparation, eat out, water intake, caffeinated beverages, dairy products, fruits and vegetables): Drinks water and diet soda.   Breakfast: fruit; cereal; english muffin Lunch: sandwich; protein/vegetable Dinner: protein and vegetables.   Encouraged to continue healthy lifestyle.   Goals    . Weight (lb) < 125 lb (56.7 kg)          Lose weight by staying active.       Fall Risk Fall Risk  07/17/2017 07/11/2016 01/03/2016 10/27/2014 10/27/2012  Falls in the past year? Yes No No No Yes  Number falls in past yr: 2 or more - - - 1  Injury with Fall? No - - - Yes  Risk for fall due to : Impaired balance/gait - - - -  Follow up Falls prevention discussed - - - -   Depression Screen PHQ 2/9 Scores 07/17/2017 07/11/2016 01/03/2016 10/27/2014  PHQ - 2 Score 0 0 0 0     Cognitive Function       Ad8 score reviewed for issues:  Issues making decisions: no  Less interest in hobbies / activities: no  Repeats questions, stories (family complaining): no  Trouble using ordinary gadgets (microwave, computer, phone): no  Forgets the month or year: no  Mismanaging finances: no  Remembering appts: no  Daily problems with thinking and/or memory: no Ad8 score is=0     Immunization History  Administered Date(s) Administered  . Influenza,inj,Quad PF,6+ Mos 07/11/2016  . Influenza-Unspecified 08/12/2014,  07/19/2015  . Pneumococcal Conjugate-13 08/12/2014  . Pneumococcal Polysaccharide-23 09/10/2012  . Tdap 08/12/2014  . Zoster 01/18/2012   Screening Tests Health Maintenance  Topic Date Due  . INFLUENZA VACCINE  05/22/2017  . MAMMOGRAM  01/10/2019  . COLONOSCOPY  03/11/2023  . TETANUS/TDAP  08/12/2024  . DEXA SCAN  Completed  . Hepatitis C Screening  Completed  . PNA vac Low Risk Adult  Completed      Plan:     Bring a copy of your living will and/or healthcare power of attorney to your next office visit.  Continue doing brain stimulating activities (puzzles, reading, adult coloring books, staying active) to keep memory sharp.    I have personally reviewed and noted the following in the patient's chart:   .  Medical and social history . Use of alcohol, tobacco or illicit drugs  . Current medications and supplements . Functional ability and status . Nutritional status . Physical activity . Advanced directives . List of other physicians . Hospitalizations, surgeries, and ER visits in previous 12 months . Vitals . Screenings to include cognitive, depression, and falls . Referrals and appointments  In addition, I have reviewed and discussed with patient certain preventive protocols, quality metrics, and best practice recommendations. A written personalized care plan for preventive services as well as general preventive health recommendations were provided to patient.     Gerilyn Nestle, RN  07/17/2017  Reviewed documentation provided by RN and agree w/ above.  Annye Asa, MD

## 2017-07-17 ENCOUNTER — Ambulatory Visit (INDEPENDENT_AMBULATORY_CARE_PROVIDER_SITE_OTHER): Payer: PPO

## 2017-07-17 DIAGNOSIS — Z0000 Encounter for general adult medical examination without abnormal findings: Secondary | ICD-10-CM

## 2017-07-17 DIAGNOSIS — Z23 Encounter for immunization: Secondary | ICD-10-CM

## 2017-07-17 NOTE — Patient Instructions (Addendum)
Bring a copy of your living will and/or healthcare power of attorney to your next office visit.  Continue doing brain stimulating activities (puzzles, reading, adult coloring books, staying active) to keep memory sharp.     Fall Prevention in the Home Falls can cause injuries. They can happen to people of all ages. There are many things you can do to make your home safe and to help prevent falls. What can I do on the outside of my home?  Regularly fix the edges of walkways and driveways and fix any cracks.  Remove anything that might make you trip as you walk through a door, such as a raised step or threshold.  Trim any bushes or trees on the path to your home.  Use bright outdoor lighting.  Clear any walking paths of anything that might make someone trip, such as rocks or tools.  Regularly check to see if handrails are loose or broken. Make sure that both sides of any steps have handrails.  Any raised decks and porches should have guardrails on the edges.  Have any leaves, snow, or ice cleared regularly.  Use sand or salt on walking paths during winter.  Clean up any spills in your garage right away. This includes oil or grease spills. What can I do in the bathroom?  Use night lights.  Install grab bars by the toilet and in the tub and shower. Do not use towel bars as grab bars.  Use non-skid mats or decals in the tub or shower.  If you need to sit down in the shower, use a plastic, non-slip stool.  Keep the floor dry. Clean up any water that spills on the floor as soon as it happens.  Remove soap buildup in the tub or shower regularly.  Attach bath mats securely with double-sided non-slip rug tape.  Do not have throw rugs and other things on the floor that can make you trip. What can I do in the bedroom?  Use night lights.  Make sure that you have a light by your bed that is easy to reach.  Do not use any sheets or blankets that are too big for your bed. They  should not hang down onto the floor.  Have a firm chair that has side arms. You can use this for support while you get dressed.  Do not have throw rugs and other things on the floor that can make you trip. What can I do in the kitchen?  Clean up any spills right away.  Avoid walking on wet floors.  Keep items that you use a lot in easy-to-reach places.  If you need to reach something above you, use a strong step stool that has a grab bar.  Keep electrical cords out of the way.  Do not use floor polish or wax that makes floors slippery. If you must use wax, use non-skid floor wax.  Do not have throw rugs and other things on the floor that can make you trip. What can I do with my stairs?  Do not leave any items on the stairs.  Make sure that there are handrails on both sides of the stairs and use them. Fix handrails that are broken or loose. Make sure that handrails are as long as the stairways.  Check any carpeting to make sure that it is firmly attached to the stairs. Fix any carpet that is loose or worn.  Avoid having throw rugs at the top or bottom of the stairs.  If you do have throw rugs, attach them to the floor with carpet tape.  Make sure that you have a light switch at the top of the stairs and the bottom of the stairs. If you do not have them, ask someone to add them for you. What else can I do to help prevent falls?  Wear shoes that: ? Do not have high heels. ? Have rubber bottoms. ? Are comfortable and fit you well. ? Are closed at the toe. Do not wear sandals.  If you use a stepladder: ? Make sure that it is fully opened. Do not climb a closed stepladder. ? Make sure that both sides of the stepladder are locked into place. ? Ask someone to hold it for you, if possible.  Clearly mark and make sure that you can see: ? Any grab bars or handrails. ? First and last steps. ? Where the edge of each step is.  Use tools that help you move around (mobility aids) if  they are needed. These include: ? Canes. ? Walkers. ? Scooters. ? Crutches.  Turn on the lights when you go into a dark area. Replace any light bulbs as soon as they burn out.  Set up your furniture so you have a clear path. Avoid moving your furniture around.  If any of your floors are uneven, fix them.  If there are any pets around you, be aware of where they are.  Review your medicines with your doctor. Some medicines can make you feel dizzy. This can increase your chance of falling. Ask your doctor what other things that you can do to help prevent falls. This information is not intended to replace advice given to you by your health care provider. Make sure you discuss any questions you have with your health care provider. Document Released: 08/04/2009 Document Revised: 03/15/2016 Document Reviewed: 11/12/2014 Elsevier Interactive Patient Education  2018 [employer] Maintenance, Female Adopting a healthy lifestyle and getting preventive care can go a long way to promote health and wellness. Talk with your health care provider about what schedule of regular examinations is right for you. This is a good chance for you to check in with your provider about disease prevention and staying healthy. In between checkups, there are plenty of things you can do on your own. Experts have done a lot of research about which lifestyle changes and preventive measures are most likely to keep you healthy. Ask your health care provider for more information. Weight and diet Eat a healthy diet  Be sure to include plenty of vegetables, fruits, low-fat dairy products, and lean protein.  Do not eat a lot of foods high in solid fats, added sugars, or salt.  Get regular exercise. This is one of the most important things you can do for your health. ? Most adults should exercise for at least 150 minutes each week. The exercise should increase your heart rate and make you sweat (moderate-intensity  exercise). ? Most adults should also do strengthening exercises at least twice a week. This is in addition to the moderate-intensity exercise.  Maintain a healthy weight  Body mass index (BMI) is a measurement that can be used to identify possible weight problems. It estimates body fat based on height and weight. Your health care provider can help determine your BMI and help you achieve or maintain a healthy weight.  For females 71 years of age and older: ? A BMI below 18.5 is considered underweight. ? A BMI  of 18.5 to 24.9 is normal. ? A BMI of 25 to 29.9 is considered overweight. ? A BMI of 30 and above is considered obese.  Watch levels of cholesterol and blood lipids  You should start having your blood tested for lipids and cholesterol at 71 years of age, then have this test every 5 years.  You may need to have your cholesterol levels checked more often if: ? Your lipid or cholesterol levels are high. ? You are older than 71 years of age. ? You are at high risk for heart disease.  Cancer screening Lung Cancer  Lung cancer screening is recommended for adults 88-6 years old who are at high risk for lung cancer because of a history of smoking.  A yearly low-dose CT scan of the lungs is recommended for people who: ? Currently smoke. ? Have quit within the past 15 years. ? Have at least a 30-pack-year history of smoking. A pack year is smoking an average of one pack of cigarettes a day for 1 year.  Yearly screening should continue until it has been 15 years since you quit.  Yearly screening should stop if you develop a health problem that would prevent you from having lung cancer treatment.  Breast Cancer  Practice breast self-awareness. This means understanding how your breasts normally appear and feel.  It also means doing regular breast self-exams. Let your health care provider know about any changes, no matter how small.  If you are in your 20s or 30s, you should have a  clinical breast exam (CBE) by a health care provider every 1-3 years as part of a regular health exam.  If you are 75 or older, have a CBE every year. Also consider having a breast X-ray (mammogram) every year.  If you have a family history of breast cancer, talk to your health care provider about genetic screening.  If you are at high risk for breast cancer, talk to your health care provider about having an MRI and a mammogram every year.  Breast cancer gene (BRCA) assessment is recommended for women who have family members with BRCA-related cancers. BRCA-related cancers include: ? Breast. ? Ovarian. ? Tubal. ? Peritoneal cancers.  Results of the assessment will determine the need for genetic counseling and BRCA1 and BRCA2 testing.  Cervical Cancer Your health care provider may recommend that you be screened regularly for cancer of the pelvic organs (ovaries, uterus, and vagina). This screening involves a pelvic examination, including checking for microscopic changes to the surface of your cervix (Pap test). You may be encouraged to have this screening done every 3 years, beginning at age 34.  For women ages 31-65, health care providers may recommend pelvic exams and Pap testing every 3 years, or they may recommend the Pap and pelvic exam, combined with testing for human papilloma virus (HPV), every 5 years. Some types of HPV increase your risk of cervical cancer. Testing for HPV may also be done on women of any age with unclear Pap test results.  Other health care providers may not recommend any screening for nonpregnant women who are considered low risk for pelvic cancer and who do not have symptoms. Ask your health care provider if a screening pelvic exam is right for you.  If you have had past treatment for cervical cancer or a condition that could lead to cancer, you need Pap tests and screening for cancer for at least 20 years after your treatment. If Pap tests have been discontinued,  your risk factors (such as having a new sexual partner) need to be reassessed to determine if screening should resume. Some women have medical problems that increase the chance of getting cervical cancer. In these cases, your health care provider may recommend more frequent screening and Pap tests.  Colorectal Cancer  This type of cancer can be detected and often prevented.  Routine colorectal cancer screening usually begins at 71 years of age and continues through 71 years of age.  Your health care provider may recommend screening at an earlier age if you have risk factors for colon cancer.  Your health care provider may also recommend using home test kits to check for hidden blood in the stool.  A small camera at the end of a tube can be used to examine your colon directly (sigmoidoscopy or colonoscopy). This is done to check for the earliest forms of colorectal cancer.  Routine screening usually begins at age 42.  Direct examination of the colon should be repeated every 5-10 years through 71 years of age. However, you may need to be screened more often if early forms of precancerous polyps or small growths are found.  Skin Cancer  Check your skin from head to toe regularly.  Tell your health care provider about any new moles or changes in moles, especially if there is a change in a mole's shape or color.  Also tell your health care provider if you have a mole that is larger than the size of a pencil eraser.  Always use sunscreen. Apply sunscreen liberally and repeatedly throughout the day.  Protect yourself by wearing long sleeves, pants, a wide-brimmed hat, and sunglasses whenever you are outside.  Heart disease, diabetes, and high blood pressure  High blood pressure causes heart disease and increases the risk of stroke. High blood pressure is more likely to develop in: ? People who have blood pressure in the high end of the normal range (130-139/85-89 mm Hg). ? People who are  overweight or obese. ? People who are African American.  If you are 45-1 years of age, have your blood pressure checked every 3-5 years. If you are 15 years of age or older, have your blood pressure checked every year. You should have your blood pressure measured twice-once when you are at a hospital or clinic, and once when you are not at a hospital or clinic. Record the average of the two measurements. To check your blood pressure when you are not at a hospital or clinic, you can use: ? An automated blood pressure machine at a pharmacy. ? A home blood pressure monitor.  If you are between 76 years and 30 years old, ask your health care provider if you should take aspirin to prevent strokes.  Have regular diabetes screenings. This involves taking a blood sample to check your fasting blood sugar level. ? If you are at a normal weight and have a low risk for diabetes, have this test once every three years after 71 years of age. ? If you are overweight and have a high risk for diabetes, consider being tested at a younger age or more often. Preventing infection Hepatitis B  If you have a higher risk for hepatitis B, you should be screened for this virus. You are considered at high risk for hepatitis B if: ? You were born in a country where hepatitis B is common. Ask your health care provider which countries are considered high risk. ? Your parents were born in a high-risk  country, and you have not been immunized against hepatitis B (hepatitis B vaccine). ? You have HIV or AIDS. ? You use needles to inject street drugs. ? You live with someone who has hepatitis B. ? You have had sex with someone who has hepatitis B. ? You get hemodialysis treatment. ? You take certain medicines for conditions, including cancer, organ transplantation, and autoimmune conditions.  Hepatitis C  Blood testing is recommended for: ? Everyone born from [birth year] through 1965. ? Anyone with known risk factors for  hepatitis C.  Sexually transmitted infections (STIs)  You should be screened for sexually transmitted infections (STIs) including gonorrhea and chlamydia if: ? You are sexually active and are younger than 71 years of age. ? You are older than 71 years of age and your health care provider tells you that you are at risk for this type of infection. ? Your sexual activity has changed since you were last screened and you are at an increased risk for chlamydia or gonorrhea. Ask your health care provider if you are at risk.  If you do not have HIV, but are at risk, it may be recommended that you take a prescription medicine daily to prevent HIV infection. This is called pre-exposure prophylaxis (PrEP). You are considered at risk if: ? You are sexually active and do not regularly use condoms or know the HIV status of your partner(s). ? You take drugs by injection. ? You are sexually active with a partner who has HIV.  Talk with your health care provider about whether you are at high risk of being infected with HIV. If you choose to begin PrEP, you should first be tested for HIV. You should then be tested every 3 months for as long as you are taking PrEP. Pregnancy  If you are premenopausal and you may become pregnant, ask your health care provider about preconception counseling.  If you may become pregnant, take 400 to 800 micrograms (mcg) of folic acid every day.  If you want to prevent pregnancy, talk to your health care provider about birth control (contraception). Osteoporosis and menopause  Osteoporosis is a disease in which the bones lose minerals and strength with aging. This can result in serious bone fractures. Your risk for osteoporosis can be identified using a bone density scan.  If you are 62 years of age or older, or if you are at risk for osteoporosis and fractures, ask your health care provider if you should be screened.  Ask your health care provider whether you should take a  calcium or vitamin D supplement to lower your risk for osteoporosis.  Menopause may have certain physical symptoms and risks.  Hormone replacement therapy may reduce some of these symptoms and risks. Talk to your health care provider about whether hormone replacement therapy is right for you. Follow these instructions at home:  Schedule regular health, dental, and eye exams.  Stay current with your immunizations.  Do not use any tobacco products including cigarettes, chewing tobacco, or electronic cigarettes.  If you are pregnant, do not drink alcohol.  If you are breastfeeding, limit how much and how often you drink alcohol.  Limit alcohol intake to no more than 1 drink per day for nonpregnant women. One drink equals 12 ounces of beer, 5 ounces of wine, or 1 ounces of hard liquor.  Do not use street drugs.  Do not share needles.  Ask your health care provider for help if you need support or information about quitting drugs.  Tell your health care provider if you often feel depressed.  Tell your health care provider if you have ever been abused or do not feel safe at home. This information is not intended to replace advice given to you by your health care provider. Make sure you discuss any questions you have with your health care provider. Document Released: 04/23/2011 Document Revised: 03/15/2016 Document Reviewed: 07/12/2015 Elsevier Interactive Patient Education  [employer].

## 2017-07-30 DIAGNOSIS — M8580 Other specified disorders of bone density and structure, unspecified site: Secondary | ICD-10-CM | POA: Diagnosis not present

## 2017-08-01 LAB — HM DEXA SCAN

## 2017-08-13 ENCOUNTER — Encounter: Payer: Self-pay | Admitting: General Practice

## 2017-09-16 DIAGNOSIS — M21612 Bunion of left foot: Secondary | ICD-10-CM | POA: Diagnosis not present

## 2017-09-23 ENCOUNTER — Other Ambulatory Visit: Payer: Self-pay | Admitting: Orthopedic Surgery

## 2017-09-23 DIAGNOSIS — Z961 Presence of intraocular lens: Secondary | ICD-10-CM | POA: Diagnosis not present
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
## 2017-10-07 ENCOUNTER — Ambulatory Visit: Payer: PPO | Admitting: Nurse Practitioner

## 2017-10-07 ENCOUNTER — Encounter: Payer: Self-pay | Admitting: Nurse Practitioner

## 2017-10-07 VITALS — BP 141/78 | HR 71 | Ht 64.0 in | Wt 135.0 lb

## 2017-10-07 DIAGNOSIS — M5020 Other cervical disc displacement, unspecified cervical region: Secondary | ICD-10-CM | POA: Diagnosis not present

## 2017-10-07 DIAGNOSIS — F409 Phobic anxiety disorder, unspecified: Secondary | ICD-10-CM

## 2017-10-07 DIAGNOSIS — G4700 Insomnia, unspecified: Secondary | ICD-10-CM | POA: Diagnosis not present

## 2017-10-07 DIAGNOSIS — F5105 Insomnia due to other mental disorder: Secondary | ICD-10-CM | POA: Diagnosis not present

## 2017-10-07 MED ORDER — QUETIAPINE FUMARATE 25 MG PO TABS: ORAL_TABLET | ORAL | 3 refills | Status: DC

## 2017-10-07 MED ORDER — TIZANIDINE HCL 4 MG PO TABS
4.0000 mg | ORAL_TABLET | Freq: Every day | ORAL | 3 refills | Status: DC
Start: 2017-10-07 — End: 2018-10-06

## 2017-10-07 NOTE — Progress Notes (Signed)
I agree with the assessment and plan as directed by NP .The patient is known to me .   Priscilla Kirstein, MD  

## 2017-10-07 NOTE — Patient Instructions (Signed)
Continue Seroquel at current dose will refill Continue Zanaflex  At current dose  F/U yearly

## 2017-10-14 ENCOUNTER — Other Ambulatory Visit: Payer: Self-pay | Admitting: Nurse Practitioner

## 2017-10-23 ENCOUNTER — Other Ambulatory Visit: Payer: Self-pay

## 2017-10-23 ENCOUNTER — Encounter: Payer: Self-pay | Admitting: Family Medicine

## 2017-10-23 ENCOUNTER — Ambulatory Visit (INDEPENDENT_AMBULATORY_CARE_PROVIDER_SITE_OTHER): Payer: PPO | Admitting: Family Medicine

## 2017-10-23 VITALS — BP 132/80 | HR 79 | Temp 97.9°F | Resp 16 | Ht 64.0 in | Wt 133.5 lb

## 2017-10-23 DIAGNOSIS — Z0000 Encounter for general adult medical examination without abnormal findings: Secondary | ICD-10-CM

## 2017-10-23 DIAGNOSIS — I10 Essential (primary) hypertension: Secondary | ICD-10-CM | POA: Insufficient documentation

## 2017-10-23 DIAGNOSIS — Z719 Counseling, unspecified: Secondary | ICD-10-CM | POA: Insufficient documentation

## 2017-10-23 DIAGNOSIS — R82994 Hypercalciuria: Secondary | ICD-10-CM | POA: Diagnosis not present

## 2017-10-23 DIAGNOSIS — M8580 Other specified disorders of bone density and structure, unspecified site: Secondary | ICD-10-CM

## 2017-10-23 DIAGNOSIS — R030 Elevated blood-pressure reading, without diagnosis of hypertension: Secondary | ICD-10-CM | POA: Diagnosis not present

## 2017-10-23 DIAGNOSIS — Z0001 Encounter for general adult medical examination with abnormal findings: Secondary | ICD-10-CM | POA: Diagnosis not present

## 2017-10-23 LAB — HEPATIC FUNCTION PANEL
ALT: 23 U/L (ref 0–35)
AST: 14 U/L (ref 0–37)
Albumin: 4.9 g/dL (ref 3.5–5.2)
Alkaline Phosphatase: 54 U/L (ref 39–117)
Bilirubin, Direct: 0.1 mg/dL (ref 0.0–0.3)
TOTAL PROTEIN: 7.4 g/dL (ref 6.0–8.3)
Total Bilirubin: 0.8 mg/dL (ref 0.2–1.2)

## 2017-10-23 LAB — BASIC METABOLIC PANEL
BUN: 20 mg/dL (ref 6–23)
CALCIUM: 10.4 mg/dL (ref 8.4–10.5)
CO2: 29 meq/L (ref 19–32)
CREATININE: 0.65 mg/dL (ref 0.40–1.20)
Chloride: 98 mEq/L (ref 96–112)
GFR: 95.38 mL/min (ref >60.00)
Glucose, Bld: 100 mg/dL — ABNORMAL HIGH (ref 70–99)
Potassium: 3.7 mEq/L (ref 3.5–5.1)
Sodium: 137 mEq/L (ref 135–145)

## 2017-10-23 LAB — VITAMIN D 25 HYDROXY (VIT D DEFICIENCY, FRACTURES): VITD: 56.98 ng/mL (ref 30.00–100.00)

## 2017-10-23 LAB — CBC WITH DIFFERENTIAL/PLATELET
BASOS ABS: 0 10*3/uL (ref 0.0–0.1)
BASOS PCT: 0.5 % (ref 0.0–3.0)
EOS ABS: 0.1 10*3/uL (ref 0.0–0.7)
Eosinophils Relative: 2.8 % (ref 0.0–5.0)
HEMATOCRIT: 43.1 % (ref 36.0–46.0)
HEMOGLOBIN: 14.9 g/dL (ref 12.0–15.0)
LYMPHS PCT: 30.6 % (ref 12.0–46.0)
Lymphs Abs: 1.6 10*3/uL (ref 0.7–4.0)
MCHC: 34.5 g/dL (ref 30.0–36.0)
MCV: 99.3 fl (ref 78.0–100.0)
Monocytes Absolute: 0.5 10*3/uL (ref 0.1–1.0)
Monocytes Relative: 8.6 % (ref 3.0–12.0)
Neutro Abs: 3 10*3/uL (ref 1.4–7.7)
Neutrophils Relative %: 57.5 % (ref 43.0–77.0)
Platelets: 207 10*3/uL (ref 150.0–400.0)
RBC: 4.34 Mil/uL (ref 3.87–5.11)
RDW: 13.5 % (ref 11.5–15.5)
WBC: 5.3 10*3/uL (ref 4.0–10.5)

## 2017-10-23 LAB — LIPID PANEL
CHOLESTEROL: 236 mg/dL — AB (ref 0–200)
HDL: 94.4 mg/dL (ref >39.00)
LDL Cholesterol: 120 mg/dL — ABNORMAL HIGH (ref 0–99)
NONHDL: 141.91
Total CHOL/HDL Ratio: 3
Triglycerides: 109 mg/dL (ref 0.0–149.0)
VLDL: 21.8 mg/dL (ref 0.0–40.0)

## 2017-10-23 LAB — TSH: TSH: 6.66 u[IU]/mL — ABNORMAL HIGH (ref 0.35–4.50)

## 2017-10-23 NOTE — Assessment & Plan Note (Signed)
Chronic problem.  On Dyazide for this issue and NOT blood pressure.

## 2017-10-23 NOTE — Assessment & Plan Note (Signed)
Pt's PE WNL.  UTD on colonoscopy, immunizations, GYN.  Check labs.  Anticipatory guidance provided.  

## 2017-10-23 NOTE — Assessment & Plan Note (Signed)
Chronic problem.  Check Vit D level and replete prn.  UTD on DEXA.

## 2017-10-23 NOTE — Progress Notes (Signed)
   Subjective:    Patient ID: [name], female    DOB: [birth year], 72 y.o.   MRN: [id number]  HPI CPE- UTD on colonoscopy, mammo, DEXA, and immunizations.  No concerns today w/ exception of mildly elevated BP.   Review of Systems Patient reports no vision/ hearing changes, adenopathy,fever, weight change,  persistant/recurrent hoarseness , swallowing issues, chest pain, palpitations, edema, persistant/recurrent cough, hemoptysis, dyspnea (rest/exertional/paroxysmal nocturnal), gastrointestinal bleeding (melena, rectal bleeding), abdominal pain, significant heartburn, bowel changes, GU symptoms (dysuria, hematuria, incontinence), Gyn symptoms (abnormal  bleeding, pain),  syncope, focal weakness, memory loss, numbness & tingling, skin/hair/nail changes, abnormal bruising or bleeding, anxiety, or depression.     Objective:   Physical Exam General Appearance:    Alert, cooperative, no distress, appears stated age  Head:    Normocephalic, without obvious abnormality, atraumatic  Eyes:    PERRL, conjunctiva/corneas clear, EOM's intact, fundi    benign, both eyes  Ears:    Normal TM's and external ear canals, both ears  Nose:   Nares normal, septum midline, mucosa normal, no drainage    or sinus tenderness  Throat:   Lips, mucosa, and tongue normal; teeth and gums normal  Neck:   Supple, symmetrical, trachea midline, no adenopathy;    Thyroid: no enlargement/tenderness/nodules  Back:     Symmetric, no curvature, ROM normal, no CVA tenderness  Lungs:     Clear to auscultation bilaterally, respirations unlabored  Chest Wall:    No tenderness or deformity   Heart:    Regular rate and rhythm, S1 and S2 normal, no murmur, rub   or gallop  Breast Exam:    Deferred to GYN  Abdomen:     Soft, non-tender, bowel sounds active all four quadrants,    no masses, no organomegaly  Genitalia:    Deferred to GYN  Rectal:    Extremities:   Extremities normal, atraumatic, no cyanosis or edema  Pulses:    2+ and symmetric all extremities  Skin:   Skin color, texture, turgor normal, no rashes or lesions  Lymph nodes:   Cervical, supraclavicular, and axillary nodes normal  Neurologic:   CNII-XII intact, normal strength, sensation and reflexes    throughout          Assessment & Plan:

## 2017-10-23 NOTE — Patient Instructions (Signed)
Follow up in 3 months to recheck BP We'll notify you of your lab results and make any changes if needed Keep up the good work!  You look great!! Call with any questions or concerns Happy New Year!!!

## 2017-10-24 ENCOUNTER — Other Ambulatory Visit: Payer: Self-pay | Admitting: General Practice

## 2017-10-24 ENCOUNTER — Telehealth: Payer: Self-pay | Admitting: Family Medicine

## 2017-10-24 DIAGNOSIS — E039 Hypothyroidism, unspecified: Secondary | ICD-10-CM

## 2017-10-24 MED ORDER — LEVOTHYROXINE SODIUM 50 MCG PO TABS: 50.0000 ug | ORAL_TABLET | Freq: Every day | ORAL | 3 refills | Status: DC

## 2017-10-24 NOTE — Telephone Encounter (Signed)
Pt  Advised   That  Her  med for  thyriod   Was   Ready   At  [facility]  [place]     -   Pt  Also had  Some  questions  About  Her  Plan of  Care   Spoke with flow  Coordinator at   [facility]  and  They  Will  Call  Her  Back

## 2017-10-24 NOTE — Telephone Encounter (Signed)
Copied from [place] [id number]. Topic: Quick Communication - See Telephone Encounter >> Oct 24, 2017  1:39 PM Burnis Medin, NT wrote: CRM for notification. See Telephone encounter for: Pt called and said the doctor said that they wanted her to start taking Levothyroxin 50 mcg but nothing has been called in yet. Pt uses [id number] - [place], [place] - [address]  10/24/17.

## 2017-10-25 ENCOUNTER — Other Ambulatory Visit: Payer: Self-pay

## 2017-10-25 ENCOUNTER — Encounter (HOSPITAL_BASED_OUTPATIENT_CLINIC_OR_DEPARTMENT_OTHER): Payer: Self-pay | Admitting: *Deleted

## 2017-10-25 NOTE — Progress Notes (Signed)
Pt requesting a note from the surgeon to address jury duty summons for post op period

## 2017-10-28 ENCOUNTER — Encounter (HOSPITAL_BASED_OUTPATIENT_CLINIC_OR_DEPARTMENT_OTHER)
Admission: RE | Admit: 2017-10-28 | Discharge: 2017-10-28 | Disposition: A | Discharge status: Home / Self Care | Payer: PPO | Source: Ambulatory Visit | Attending: Orthopedic Surgery | Admitting: Orthopedic Surgery

## 2017-10-28 DIAGNOSIS — Z79899 Other long term (current) drug therapy: Secondary | ICD-10-CM | POA: Diagnosis not present

## 2017-10-28 DIAGNOSIS — Z91048 Other nonmedicinal substance allergy status: Secondary | ICD-10-CM | POA: Diagnosis not present

## 2017-10-28 DIAGNOSIS — M2012 Hallux valgus (acquired), left foot: Secondary | ICD-10-CM | POA: Diagnosis not present

## 2017-10-28 DIAGNOSIS — Z96653 Presence of artificial knee joint, bilateral: Secondary | ICD-10-CM | POA: Diagnosis not present

## 2017-10-28 DIAGNOSIS — M1990 Unspecified osteoarthritis, unspecified site: Secondary | ICD-10-CM | POA: Diagnosis not present

## 2017-10-28 DIAGNOSIS — F419 Anxiety disorder, unspecified: Secondary | ICD-10-CM | POA: Diagnosis not present

## 2017-10-28 DIAGNOSIS — M21612 Bunion of left foot: Secondary | ICD-10-CM | POA: Diagnosis not present

## 2017-10-28 DIAGNOSIS — E039 Hypothyroidism, unspecified: Secondary | ICD-10-CM | POA: Diagnosis not present

## 2017-10-28 DIAGNOSIS — F5105 Insomnia due to other mental disorder: Secondary | ICD-10-CM | POA: Diagnosis not present

## 2017-10-28 DIAGNOSIS — Z885 Allergy status to narcotic agent status: Secondary | ICD-10-CM | POA: Diagnosis not present

## 2017-10-28 NOTE — Progress Notes (Signed)
EKG reviewed by Dr. Odonno, will proceed with surgery as scheduled. 

## 2017-10-31 ENCOUNTER — Ambulatory Visit (HOSPITAL_BASED_OUTPATIENT_CLINIC_OR_DEPARTMENT_OTHER)
Admission: RE | Admit: 2017-10-31 | Discharge: 2017-10-31 | Disposition: A | Discharge status: Home / Self Care | Payer: PPO | Source: Ambulatory Visit | Attending: Orthopedic Surgery | Admitting: Orthopedic Surgery

## 2017-10-31 ENCOUNTER — Other Ambulatory Visit: Payer: Self-pay

## 2017-10-31 ENCOUNTER — Encounter (HOSPITAL_BASED_OUTPATIENT_CLINIC_OR_DEPARTMENT_OTHER): Payer: Self-pay

## 2017-10-31 ENCOUNTER — Ambulatory Visit (HOSPITAL_BASED_OUTPATIENT_CLINIC_OR_DEPARTMENT_OTHER): Payer: PPO | Admitting: Certified Registered"

## 2017-10-31 ENCOUNTER — Encounter (HOSPITAL_BASED_OUTPATIENT_CLINIC_OR_DEPARTMENT_OTHER)
Admission: RE | Disposition: A | Discharge status: Home / Self Care | Payer: Self-pay | Source: Ambulatory Visit | Attending: Orthopedic Surgery

## 2017-10-31 DIAGNOSIS — E039 Hypothyroidism, unspecified: Secondary | ICD-10-CM | POA: Insufficient documentation

## 2017-10-31 DIAGNOSIS — Z91048 Other nonmedicinal substance allergy status: Secondary | ICD-10-CM | POA: Diagnosis not present

## 2017-10-31 DIAGNOSIS — Z79899 Other long term (current) drug therapy: Secondary | ICD-10-CM | POA: Diagnosis not present

## 2017-10-31 DIAGNOSIS — F419 Anxiety disorder, unspecified: Secondary | ICD-10-CM | POA: Insufficient documentation

## 2017-10-31 DIAGNOSIS — M1990 Unspecified osteoarthritis, unspecified site: Secondary | ICD-10-CM | POA: Diagnosis not present

## 2017-10-31 DIAGNOSIS — Z885 Allergy status to narcotic agent status: Secondary | ICD-10-CM | POA: Insufficient documentation

## 2017-10-31 DIAGNOSIS — F5105 Insomnia due to other mental disorder: Secondary | ICD-10-CM | POA: Diagnosis not present

## 2017-10-31 DIAGNOSIS — M2012 Hallux valgus (acquired), left foot: Secondary | ICD-10-CM | POA: Diagnosis not present

## 2017-10-31 DIAGNOSIS — Z96653 Presence of artificial knee joint, bilateral: Secondary | ICD-10-CM | POA: Insufficient documentation

## 2017-10-31 DIAGNOSIS — K449 Diaphragmatic hernia without obstruction or gangrene: Secondary | ICD-10-CM | POA: Diagnosis not present

## 2017-10-31 DIAGNOSIS — M21612 Bunion of left foot: Secondary | ICD-10-CM | POA: Diagnosis not present

## 2017-10-31 DIAGNOSIS — K219 Gastro-esophageal reflux disease without esophagitis: Secondary | ICD-10-CM | POA: Diagnosis not present

## 2017-10-31 HISTORY — DX: Hypothyroidism, unspecified: E03.9

## 2017-10-31 HISTORY — DX: Personal history of other diseases of the digestive system: Z87.19

## 2017-10-31 HISTORY — PX: METATARSAL OSTEOTOMY WITH BUNIONECTOMY: SHX5662

## 2017-10-31 SURGERY — BUNIONECTOMY, WITH METATARSAL OSTEOTOMY
Anesthesia: General | Site: Foot | Laterality: Left

## 2017-10-31 MED ORDER — OXYCODONE HCL 5 MG PO TABS: 5.0000 mg | ORAL_TABLET | ORAL | 0 refills | Status: DC | PRN

## 2017-10-31 MED ORDER — KETOROLAC TROMETHAMINE 30 MG/ML IJ SOLN: 30.0000 mg | Freq: Once | INTRAMUSCULAR | Status: DC | PRN

## 2017-10-31 MED ORDER — FENTANYL CITRATE (PF) 100 MCG/2ML IJ SOLN: 25.0000 ug | INTRAMUSCULAR | Status: DC | PRN

## 2017-10-31 MED ORDER — SODIUM CHLORIDE 0.9 % IV SOLN: INTRAVENOUS | Status: DC

## 2017-10-31 MED ORDER — MIDAZOLAM HCL 2 MG/2ML IJ SOLN
1.0000 mg | INTRAMUSCULAR | Status: DC | PRN
  Administered 2017-10-31: 1 mg via INTRAVENOUS

## 2017-10-31 MED ORDER — CEFAZOLIN SODIUM-DEXTROSE 2-4 GM/100ML-% IV SOLN
INTRAVENOUS | Status: AC
  Filled 2017-10-31: qty 100

## 2017-10-31 MED ORDER — SCOPOLAMINE 1 MG/3DAYS TD PT72: 1.0000 | MEDICATED_PATCH | Freq: Once | TRANSDERMAL | Status: DC | PRN

## 2017-10-31 MED ORDER — CEFAZOLIN SODIUM-DEXTROSE 2-4 GM/100ML-% IV SOLN
2.0000 g | INTRAVENOUS | Status: AC
  Administered 2017-10-31: 2 g via INTRAVENOUS

## 2017-10-31 MED ORDER — 0.9 % SODIUM CHLORIDE (POUR BTL) OPTIME
TOPICAL | Status: DC | PRN
  Administered 2017-10-31: 150 mL

## 2017-10-31 MED ORDER — CHLORHEXIDINE GLUCONATE 4 % EX LIQD: 60.0000 mL | Freq: Once | CUTANEOUS | Status: DC

## 2017-10-31 MED ORDER — LACTATED RINGERS IV SOLN
INTRAVENOUS | Status: DC
  Administered 2017-10-31 (×2): via INTRAVENOUS

## 2017-10-31 MED ORDER — ONDANSETRON HCL 4 MG PO TABS: 4.0000 mg | ORAL_TABLET | Freq: Three times a day (TID) | ORAL | 0 refills | Status: DC | PRN

## 2017-10-31 MED ORDER — ONDANSETRON HCL 4 MG/2ML IJ SOLN
INTRAMUSCULAR | Status: DC | PRN
  Administered 2017-10-31: 4 mg via INTRAVENOUS

## 2017-10-31 MED ORDER — LIDOCAINE HCL (CARDIAC) 20 MG/ML IV SOLN
INTRAVENOUS | Status: DC | PRN
  Administered 2017-10-31: 30 mg via INTRAVENOUS

## 2017-10-31 MED ORDER — MIDAZOLAM HCL 2 MG/2ML IJ SOLN
INTRAMUSCULAR | Status: AC
  Filled 2017-10-31: qty 2

## 2017-10-31 MED ORDER — PROMETHAZINE HCL 25 MG/ML IJ SOLN: 6.2500 mg | INTRAMUSCULAR | Status: DC | PRN

## 2017-10-31 MED ORDER — MEPERIDINE HCL 25 MG/ML IJ SOLN: 6.2500 mg | INTRAMUSCULAR | Status: DC | PRN

## 2017-10-31 MED ORDER — FENTANYL CITRATE (PF) 100 MCG/2ML IJ SOLN
50.0000 ug | INTRAMUSCULAR | Status: DC | PRN
  Administered 2017-10-31: 50 ug via INTRAVENOUS

## 2017-10-31 MED ORDER — PROPOFOL 10 MG/ML IV BOLUS
INTRAVENOUS | Status: DC | PRN
  Administered 2017-10-31: 150 mg via INTRAVENOUS

## 2017-10-31 MED ORDER — FENTANYL CITRATE (PF) 100 MCG/2ML IJ SOLN
INTRAMUSCULAR | Status: AC
  Filled 2017-10-31: qty 2

## 2017-10-31 MED ORDER — OXYCODONE HCL 5 MG PO TABS: 5.0000 mg | ORAL_TABLET | Freq: Once | ORAL | Status: DC | PRN

## 2017-10-31 MED ORDER — OXYCODONE HCL 5 MG/5ML PO SOLN: 5.0000 mg | Freq: Once | ORAL | Status: DC | PRN

## 2017-10-31 MED ORDER — DEXAMETHASONE SODIUM PHOSPHATE 10 MG/ML IJ SOLN
INTRAMUSCULAR | Status: DC | PRN
  Administered 2017-10-31: 10 mg via INTRAVENOUS

## 2017-10-31 SURGICAL SUPPLY — 70 items
BANDAGE ESMARK 6X9 LF (GAUZE/BANDAGES/DRESSINGS) IMPLANT
BIT DRILL 1.5X30 QC DISP (BIT) ×2 IMPLANT
BLADE AVERAGE 25MMX9MM (BLADE) ×1
BLADE AVERAGE 25X9 (BLADE) ×1 IMPLANT
BLADE LONG MED 25X9 (BLADE) IMPLANT
BLADE LONG MED 25X9MM (BLADE)
BLADE MICRO SAGITTAL (BLADE) IMPLANT
BLADE SURG 15 STRL LF DISP TIS (BLADE) ×3 IMPLANT
BLADE SURG 15 STRL SS (BLADE) ×9
BNDG CMPR 9X6 STRL LF SNTH (GAUZE/BANDAGES/DRESSINGS)
BNDG COHESIVE 4X5 TAN STRL (GAUZE/BANDAGES/DRESSINGS) ×3 IMPLANT
BNDG COHESIVE 6X5 TAN STRL LF (GAUZE/BANDAGES/DRESSINGS) IMPLANT
BNDG CONFORM 3 STRL LF (GAUZE/BANDAGES/DRESSINGS) ×3 IMPLANT
BNDG ESMARK 6X9 LF (GAUZE/BANDAGES/DRESSINGS)
CHLORAPREP W/TINT 26ML (MISCELLANEOUS) ×3 IMPLANT
COVER BACK TABLE 60X90IN (DRAPES) ×3 IMPLANT
CUFF TOURNIQUET SINGLE 24IN (TOURNIQUET CUFF) IMPLANT
CUFF TOURNIQUET SINGLE 34IN LL (TOURNIQUET CUFF) ×2 IMPLANT
DRAPE EXTREMITY T 121X128X90 (DRAPE) ×3 IMPLANT
DRAPE OEC MINIVIEW 54X84 (DRAPES) ×3 IMPLANT
DRAPE U-SHAPE 47X51 STRL (DRAPES) ×3 IMPLANT
DRSG MEPITEL 4X7.2 (GAUZE/BANDAGES/DRESSINGS) ×3 IMPLANT
DRSG PAD ABDOMINAL 8X10 ST (GAUZE/BANDAGES/DRESSINGS) ×3 IMPLANT
ELECT REM PT RETURN 9FT ADLT (ELECTROSURGICAL) ×3
ELECTRODE REM PT RTRN 9FT ADLT (ELECTROSURGICAL) ×1 IMPLANT
GAUZE SPONGE 4X4 12PLY STRL (GAUZE/BANDAGES/DRESSINGS) ×3 IMPLANT
GLOVE BIO SURGEON STRL SZ8 (GLOVE) ×3 IMPLANT
GLOVE BIOGEL PI IND STRL 7.0 (GLOVE) IMPLANT
GLOVE BIOGEL PI IND STRL 8 (GLOVE) ×2 IMPLANT
GLOVE BIOGEL PI INDICATOR 7.0 (GLOVE) ×4
GLOVE BIOGEL PI INDICATOR 8 (GLOVE) ×4
GLOVE ECLIPSE 6.5 STRL STRAW (GLOVE) ×2 IMPLANT
GLOVE ECLIPSE 8.0 STRL XLNG CF (GLOVE) ×3 IMPLANT
GOWN STRL REUS W/ TWL LRG LVL3 (GOWN DISPOSABLE) ×1 IMPLANT
GOWN STRL REUS W/ TWL XL LVL3 (GOWN DISPOSABLE) ×2 IMPLANT
GOWN STRL REUS W/TWL LRG LVL3 (GOWN DISPOSABLE) ×3
GOWN STRL REUS W/TWL XL LVL3 (GOWN DISPOSABLE) ×6
K-WIRE .054X4 (WIRE) ×2 IMPLANT
NDL HYPO 25X1 1.5 SAFETY (NEEDLE) IMPLANT
NEEDLE HYPO 25X1 1.5 SAFETY (NEEDLE) IMPLANT
NS IRRIG 1000ML POUR BTL (IV SOLUTION) ×3 IMPLANT
PACK BASIN DAY SURGERY FS (CUSTOM PROCEDURE TRAY) ×3 IMPLANT
PAD CAST 4YDX4 CTTN HI CHSV (CAST SUPPLIES) ×1 IMPLANT
PADDING CAST ABS 4INX4YD NS (CAST SUPPLIES)
PADDING CAST ABS COTTON 4X4 ST (CAST SUPPLIES) IMPLANT
PADDING CAST COTTON 4X4 STRL (CAST SUPPLIES) ×3
PENCIL BUTTON HOLSTER BLD 10FT (ELECTRODE) ×3 IMPLANT
SANITIZER HAND PURELL 535ML FO (MISCELLANEOUS) ×3 IMPLANT
SCREW 2.0MM CORTICAL FT 24MM (Screw) ×4 IMPLANT
SCREW CORTICAL 2.0X16 (Screw) ×2 IMPLANT
SHEET MEDIUM DRAPE 40X70 STRL (DRAPES) ×3 IMPLANT
SLEEVE SCD COMPRESS KNEE MED (MISCELLANEOUS) ×3 IMPLANT
SPONGE LAP 18X18 X RAY DECT (DISPOSABLE) ×3 IMPLANT
STOCKINETTE 6  STRL (DRAPES) ×2
STOCKINETTE 6 STRL (DRAPES) ×1 IMPLANT
SUCTION FRAZIER HANDLE 10FR (MISCELLANEOUS) ×2
SUCTION TUBE FRAZIER 10FR DISP (MISCELLANEOUS) IMPLANT
SUT ETHIBOND 3-0 V-5 (SUTURE) IMPLANT
SUT ETHILON 3 0 PS 1 (SUTURE) ×3 IMPLANT
SUT MNCRL AB 3-0 PS2 18 (SUTURE) ×3 IMPLANT
SUT VIC AB 0 SH 27 (SUTURE) IMPLANT
SUT VIC AB 2-0 SH 27 (SUTURE) ×3
SUT VIC AB 2-0 SH 27XBRD (SUTURE) IMPLANT
SUT VICRYL 0 UR6 27IN ABS (SUTURE) IMPLANT
SYR BULB 3OZ (MISCELLANEOUS) ×3 IMPLANT
SYR CONTROL 10ML LL (SYRINGE) IMPLANT
TOWEL OR 17X24 6PK STRL BLUE (TOWEL DISPOSABLE) ×5 IMPLANT
TUBE CONNECTING 20'X1/4 (TUBING) ×1
TUBE CONNECTING 20X1/4 (TUBING) ×1 IMPLANT
UNDERPAD 30X30 (UNDERPADS AND DIAPERS) ×3 IMPLANT

## 2017-10-31 NOTE — Op Note (Signed)
10/31/2017  1:29 PM  PATIENT:  [name]  72 y.o. female  PRE-OPERATIVE DIAGNOSIS:  Left bunion  POST-OPERATIVE DIAGNOSIS: same  Procedure(s): 1.  Left modified McBride bunionectomy 2.  Left 1st MT scarf and proximal phalanx Akin osteotomies 3.  Left foot AP and lateral xrays  SURGEON:  Wylene Simmer, MD  ASSISTANT: Mechele Claude, PA-C  ANESTHESIA:   General, regional  EBL:  minimal   TOURNIQUET:   Total Tourniquet Time Documented: Thigh (Left) - 38 minutes Total: Thigh (Left) - 38 minutes  COMPLICATIONS:  None apparent  DISPOSITION:  Extubated, awake and stable to recovery.  INDICATION FOR PROCEDURE: The patient is a 72 year old female without significant past medical history.  She has a painful left foot bunion deformity that is been increasingly bothering her with shoe wear.  She has failed nonoperative treatment to date including activity modification, oral anti-inflammatories and she will modification.  She presents today for bunion correction.  The risks and benefits of the alternative treatment options have been discussed in detail.  The patient wishes to proceed with surgery and specifically understands risks of bleeding, infection, nerve damage, blood clots, need for additional surgery, amputation and death.  PROCEDURE IN DETAIL:  After pre operative consent was obtained, and the correct operative site was identified, the patient was brought to the operating room and placed supine on the OR table.  Anesthesia was administered.  Pre-operative antibiotics were administered.  A surgical timeout was taken.  The left lower extremity was prepped and draped in standard sterile fashion with a tourniquet around the thigh.  The extremity was exsanguinated and the tourniquet was inflated to 250 mmHg.  A longitudinal incision was made at the dorsum of the first web space.  Dissection was carried down through the simultaneous tissues.  The intermetatarsal ligament was divided under  direct vision.  An arthrotomy was then made between the lateral sesamoid and first metatarsal head.  Small perforations were made in the lateral joint capsule.  The hallux could then be positioned in 20 degrees of varus passively.  Attention was then turned to the medial forefoot.  A longitudinal incision was made over the medial eminence.  Dissection was carried down to the medial joint capsule which was then incised and elevated plantarly and dorsally.  The hypertrophic medial eminence was resected in line with the metatarsal shaft.  A scarf osteotomy was then cut in the shaft of the metatarsal.  The head of the metatarsal was translated laterally correcting the hallux valgus and intermetatarsal angles.  The osteotomy was fixed with 2 Biomet 2 mm fully threaded screws.  Overhanging bone was trimmed with a rongeur after x-rays confirmed appropriate correction of the deformity.  Patient was noted to still have a bit of hallux valgus interphalangeus.  Dissection was carried along the medial aspect of the possible phalanx.  An Akin osteotomy was cut in the proximal phalanx removing a small wedge of bone medially.  The osteotomy was closed down and fixed with a 2 mm lag screw.  AP and lateral radiographs confirmed appropriate correction of the bunion deformity in appropriate position and length of all hardware.  The wounds were irrigated copiously.  The medial joint capsule was repaired with indicating sutures of 2-0 Vicryl.  Subcutaneous tissues were approximated with Vicryl and skin incisions were closed with nylon.  Sterile dressings were applied followed by a bunion wrap.  Tourniquet was released after application of the dressings.  The patient was awakened from anesthesia and transported  to the recovery room in stable condition.   FOLLOW UP PLAN: Weightbearing as tolerated on the heel and a Darco wedge shoe.  Follow-up with me in 2 weeks for suture removal and conversion to a toe spacer.   RADIOGRAPHS:  AP and lateral radiographs of the left foot are obtained intraoperatively.  These show interval correction of the bunion deformity with scarf and Akinn osteotomies.  Hardware is appropriately positioned and of the appropriate lengths.  No other acute injuries are noted.    Mechele Claude PA-C was present and scrubbed for the duration of the operative case. His assistance assistance was essential in positioning the patient, prepping and draping, gaining maintaining exposure, performing the operation, closing and dressing the wounds and applying the splint.

## 2017-10-31 NOTE — Anesthesia Preprocedure Evaluation (Signed)
Anesthesia Evaluation  Patient identified by MRN, date of birth, ID band Patient awake    Reviewed: Allergy & Precautions, NPO status , Patient's Chart, lab work & pertinent test results  Airway Mallampati: II  TM Distance: >3 FB Neck ROM: Full    Dental no notable dental hx.    Pulmonary neg pulmonary ROS,    Pulmonary exam normal breath sounds clear to auscultation       Cardiovascular negative cardio ROS Normal cardiovascular exam Rhythm:Regular Rate:Normal     Neuro/Psych negative neurological ROS  negative psych ROS   GI/Hepatic Neg liver ROS, hiatal hernia, GERD  ,  Endo/Other  negative endocrine ROSHypothyroidism   Renal/GU negative Renal ROS     Musculoskeletal  (+) Arthritis ,   Abdominal   Peds  Hematology negative hematology ROS (+)   Anesthesia Other Findings   Reproductive/Obstetrics negative OB ROS                             Anesthesia Physical Anesthesia Plan  ASA: II  Anesthesia Plan: General   Post-op Pain Management: GA combined w/ Regional for post-op pain   Induction: Intravenous  PONV Risk Score and Plan: 3 and Ondansetron, Dexamethasone and Treatment may vary due to age or medical condition  Airway Management Planned: LMA  Additional Equipment:   Intra-op Plan:   Post-operative Plan: Extubation in OR  Informed Consent: I have reviewed the patients History and Physical, chart, labs and discussed the procedure including the risks, benefits and alternatives for the proposed anesthesia with the patient or authorized representative who has indicated his/her understanding and acceptance.   Dental advisory given  Plan Discussed with: CRNA  Anesthesia Plan Comments:         Anesthesia Quick Evaluation

## 2017-10-31 NOTE — Discharge Instructions (Addendum)
Wylene Simmer, MD [place]  Please read the following information regarding your care after surgery.  Medications  You only need a prescription for the narcotic pain medicine (ex. oxycodone, Percocet, Norco).  All of the other medicines listed below are available over the counter. X Aleve 2 pills twice a day for the first 3 days after surgery. X acetominophen (Tylenol) 650 mg every 4-6 hours as you need for minor to moderate pain X oxycodone as prescribed for severe pain  Narcotic pain medicine (ex. oxycodone, Percocet, Vicodin) will cause constipation.  To prevent this problem, take the following medicines while you are taking any pain medicine. X docusate sodium (Colace) 100 mg twice a day X senna (Senokot) 2 tablets twice a day  X To help prevent blood clots, take a baby aspirin (81 mg) twice a day for two weeks after surgery.  You should also get up every hour while you are awake to move around.    Weight Bearing ? Bear weight when you are able on your operated leg or foot. X Bear weight only on your operated foot in the post-op shoe.  Cast / Splint / Dressing X Keep your splint, cast or dressing clean and dry.  Dont put anything (coat hanger, pencil, etc) down inside of it.  If it gets damp, use a hair dryer on the cool setting to dry it.  If it gets soaked, call the office to schedule an appointment for a cast change. ? Remove your dressing 3 days after surgery and cover the incisions with dry dressings.    After your dressing, cast or splint is removed; you may shower, but do not soak or scrub the wound.  Allow the water to run over it, and then gently pat it dry.  Swelling It is normal for you to have swelling where you had surgery.  To reduce swelling and pain, keep your toes above your nose for at least 3 days after surgery.  It may be necessary to keep your foot or leg elevated for several weeks.  If it hurts, it should be elevated.  Follow Up Call my office at  [phone] when you are discharged from the hospital or surgery center to schedule an appointment to be seen two weeks after surgery.  Call my office at [phone] if you develop a fever >101.5 F, nausea, vomiting, bleeding from the surgical site or severe pain.     Post Anesthesia Home Care Instructions  Activity: Get plenty of rest for the remainder of the day. A responsible individual must stay with you for 24 hours following the procedure.  For the next 24 hours, DO NOT: -Drive a car -Paediatric nurse -Drink alcoholic beverages -Take any medication unless instructed by your physician -Make any legal decisions or sign important papers.  Meals: Start with liquid foods such as gelatin or soup. Progress to regular foods as tolerated. Avoid greasy, spicy, heavy foods. If nausea and/or vomiting occur, drink only clear liquids until the nausea and/or vomiting subsides. Call your physician if vomiting continues.  Special Instructions/Symptoms: Your throat may feel dry or sore from the anesthesia or the breathing tube placed in your throat during surgery. If this causes discomfort, gargle with warm salt water. The discomfort should disappear within 24 hours.  If you had a scopolamine patch placed behind your ear for the management of post- operative nausea and/or vomiting:  1. The medication in the patch is effective for 72 hours, after which it should be removed.  Wrap patch in a tissue and discard in the trash. Wash hands thoroughly with soap and water. 2. You may remove the patch earlier than 72 hours if you experience unpleasant side effects which may include dry mouth, dizziness or visual disturbances. 3. Avoid touching the patch. Wash your hands with soap and water after contact with the patch.   Regional Anesthesia Blocks  1. Numbness or the inability to move the "blocked" extremity may last from 3-48 hours after placement. The length of time depends on the medication injected  and your individual response to the medication. If the numbness is not going away after 48 hours, call your surgeon.  2. The extremity that is blocked will need to be protected until the numbness is gone and the  Strength has returned. Because you cannot feel it, you will need to take extra care to avoid injury. Because it may be weak, you may have difficulty moving it or using it. You may not know what position it is in without looking at it while the block is in effect.  3. For blocks in the legs and feet, returning to weight bearing and walking needs to be done carefully. You will need to wait until the numbness is entirely gone and the strength has returned. You should be able to move your leg and foot normally before you try and bear weight or walk. You will need someone to be with you when you first try to ensure you do not fall and possibly risk injury.  4. Bruising and tenderness at the needle site are common side effects and will resolve in a few days.  5. Persistent numbness or new problems with movement should be communicated to the surgeon or the [place] [id number] [place] [phone]).

## 2017-10-31 NOTE — Anesthesia Procedure Notes (Signed)
Procedure Name: LMA Insertion Date/Time: 10/31/2017 12:10 PM Performed by: Signe Colt, CRNA Pre-anesthesia Checklist: Patient identified, Emergency Drugs available, Suction available and Patient being monitored Patient Re-evaluated:Patient Re-evaluated prior to induction Oxygen Delivery Method: Circle system utilized Preoxygenation: Pre-oxygenation with 100% oxygen Induction Type: IV induction Ventilation: Mask ventilation without difficulty LMA: LMA inserted LMA Size: 4.0 Number of attempts: 1 Airway Equipment and Method: Bite block Placement Confirmation: positive ETCO2 Tube secured with: Tape Dental Injury: Teeth and Oropharynx as per pre-operative assessment

## 2017-10-31 NOTE — Transfer of Care (Signed)
Immediate Anesthesia Transfer of Care Note  Patient: [name]  Procedure(s) Performed: Left First Metatarsal Scarf, Modified McBride and Akin Osteotomies (Left Foot)  Patient Location: PACU  Anesthesia Type:GA combined with regional for post-op pain  Level of Consciousness: awake and patient cooperative  Airway & Oxygen Therapy: Patient Spontanous Breathing and Patient connected to face mask oxygen  Post-op Assessment: Report given to RN and Post -op Vital signs reviewed and stable  Post vital signs: Reviewed and stable  Last Vitals:  Vitals:   10/31/17 1000 10/31/17 1125  BP: (!) 136/93   Pulse: 76 81  Resp: 16 15  Temp: 36.7 C   SpO2: 100% 100%    Last Pain:  Vitals:   10/31/17 1000  TempSrc: Oral         Complications: No apparent anesthesia complications

## 2017-10-31 NOTE — Progress Notes (Signed)
Assisted Dr. Germeroth with left, ultrasound guided, popliteal block. Side rails up, monitors on throughout procedure. See vital signs in flow sheet. Tolerated Procedure well. 

## 2017-10-31 NOTE — H&P (Signed)
[name] is an 72 y.o. female.   Chief Complaint: Left foot pain HPI: The patient is a 72 year old female without significant past medical history.  She has developed a painful bunion over the last few years.  She has failed nonoperative treatment to date including activity modification, oral anti-inflammatories and shoe wear modification.  She presents today for surgical correction of her bunion deformity.  Past Medical History:  Diagnosis Date  . Arthritis    Dr Estanislado Pandy  . Cataract   . Fracture 10/2016   left ankel  . History of hiatal hernia    found on endoscopy  . Hypothyroidism   . Insomnia due to anxiety and fear   . Snoring 09/29/2014    Past Surgical History:  Procedure Laterality Date  . ABDOMINAL HYSTERECTOMY  1987   TAH,BSO, APPENDECTOMY  . APPENDECTOMY  1987   APPENDECTOMY AT [facility],BSO  . CATARACT EXTRACTION, BILATERAL Bilateral    20 plus years ago  . COLONOSCOPY      X 3; [facility]. All negative  . KNEE ARTHROSCOPY Bilateral   . REPLACEMENT TOTAL KNEE Bilateral    X 1 each; Dr Percell Miller  . ROTATOR CUFF REPAIR Right 2010    Family History  Problem Relation Age of Onset  . Ovarian cancer Mother 51  . Alcohol abuse Father   . Cirrhosis Father   . Stroke Sister 14  . Diabetes Paternal Grandmother   . Heart disease Neg Hx   . Hypertension Neg Hx   . Hyperlipidemia Neg Hx   . Colon cancer Neg Hx    Social History:  reports that  has never smoked. she has never used smokeless tobacco. She reports that she drinks about 3.0 oz of alcohol per week. She reports that she does not use drugs.  Allergies:  Allergies  Allergen Reactions  . Codeine Nausea And Vomiting  . Formaldehyde Other (See Comments)    Allergy testing, unknown reaction    Medications Prior to Admission  Medication Sig Dispense Refill  . Calcium Carbonate-Vitamin D (CALCIUM + D PO) Take 1 tablet by mouth daily.     . Cholecalciferol (VITAMIN D3) 3000 units TABS Take 375 mcg by mouth  once a week.    . Evening Primrose Oil CAPS Take 2 capsules by mouth daily.    Marland Kitchen GLUCOSAMINE PO Take by mouth.    . levothyroxine (SYNTHROID, LEVOTHROID) 50 MCG tablet Take 1 tablet (50 mcg total) by mouth daily. 30 tablet 3  . MILK THISTLE PO Take by mouth.    . Multiple Vitamin (MULTIVITAMIN) capsule Take 1 capsule by mouth daily.    . Omega-3 Fatty Acids (OMEGA 3 PO) Take by mouth.    . QUEtiapine (SEROQUEL) 25 MG tablet TAKE 3 TABLETS(75 MG) BY MOUTH AT BEDTIME 270 tablet 3  . tiZANidine (ZANAFLEX) 4 MG tablet Take 1 tablet (4 mg total) by mouth at bedtime. 90 tablet 3  . triamterene-hydrochlorothiazide (DYAZIDE) 37.5-25 MG capsule Take 1 each (1 capsule total) by mouth every morning. 90 capsule 4  . Cholecalciferol (VITAMIN D PO) Take by mouth.      No results found for this or any previous visit (from the past 48 hour(s)). No results found.  ROS no recent fever, chills, nausea, vomiting or changes in her appetite.  Blood pressure (!) 136/93, pulse 76, temperature 98.1 F (36.7 C), temperature source Oral, resp. rate 16, height 5\' 4"  (1.626 m), weight 60.3 kg (133 lb), SpO2 100 %. Physical Exam  Well-nourished well-developed woman in no apparent distress.  Mood and affect are normal.  Extraocular motions are intact.  Respirations are unlabored.  Her gait is normal.  Left foot has a moderate bunion deformity.  Skin is healthy and intact.  No lymphadenopathy.  Pulses are palpable.  5 out of 5 strength in plantar flexion and dorsi flexion of the ankle and toes.  Sensibility to light touch is normal throughout the forefoot.  Assessment/Plan Painful left foot bunion deformity -to operating room for first metatarsal scarf osteotomy, modified McBride bunionectomy and possible Akin osteotomy.  The risks and benefits of the alternative treatment options have been discussed in detail.  The patient wishes to proceed with surgery and specifically understands risks of bleeding, infection, nerve  damage, blood clots, need for additional surgery, amputation and death.   Wylene Simmer, MD 11/22/2017, 11:30 AM

## 2017-11-01 ENCOUNTER — Encounter (HOSPITAL_BASED_OUTPATIENT_CLINIC_OR_DEPARTMENT_OTHER): Payer: Self-pay | Admitting: Orthopedic Surgery

## 2017-11-01 NOTE — Anesthesia Postprocedure Evaluation (Signed)
Anesthesia Post Note  Patient: [name]  Procedure(s) Performed: Left First Metatarsal Scarf, Modified McBride and Akin Osteotomies (Left Foot)     Patient location during evaluation: PACU Anesthesia Type: General Level of consciousness: sedated and patient cooperative Pain management: pain level controlled Vital Signs Assessment: post-procedure vital signs reviewed and stable Respiratory status: spontaneous breathing Cardiovascular status: stable Anesthetic complications: no    Last Vitals:  Vitals:   10/31/17 1330 10/31/17 1420  BP: 139/86 139/82  Pulse: 86 84  Resp: 14 18  Temp:  (!) 36.4 C  SpO2: 100% 100%    Last Pain:  Vitals:   10/31/17 1420  TempSrc:   PainSc: 0-No pain                 Nolon Nations

## 2017-11-25 ENCOUNTER — Other Ambulatory Visit: Payer: PPO

## 2017-11-26 ENCOUNTER — Other Ambulatory Visit (INDEPENDENT_AMBULATORY_CARE_PROVIDER_SITE_OTHER): Payer: PPO

## 2017-11-26 DIAGNOSIS — E039 Hypothyroidism, unspecified: Secondary | ICD-10-CM | POA: Diagnosis not present

## 2017-11-26 LAB — TSH: TSH: 2.95 u[IU]/mL (ref 0.35–4.50)

## 2017-11-27 ENCOUNTER — Encounter: Payer: Self-pay | Admitting: General Practice

## 2017-12-12 DIAGNOSIS — D0471 Carcinoma in situ of skin of right lower limb, including hip: Secondary | ICD-10-CM | POA: Diagnosis not present

## 2017-12-12 DIAGNOSIS — C44712 Basal cell carcinoma of skin of right lower limb, including hip: Secondary | ICD-10-CM | POA: Diagnosis not present

## 2017-12-12 DIAGNOSIS — L308 Other specified dermatitis: Secondary | ICD-10-CM | POA: Diagnosis not present

## 2017-12-13 DIAGNOSIS — M21612 Bunion of left foot: Secondary | ICD-10-CM | POA: Diagnosis not present

## 2017-12-13 DIAGNOSIS — M79672 Pain in left foot: Secondary | ICD-10-CM | POA: Diagnosis not present

## 2017-12-13 DIAGNOSIS — M21619 Bunion of unspecified foot: Secondary | ICD-10-CM | POA: Diagnosis not present

## 2017-12-13 DIAGNOSIS — Z4789 Encounter for other orthopedic aftercare: Secondary | ICD-10-CM | POA: Diagnosis not present

## 2017-12-25 DIAGNOSIS — D0471 Carcinoma in situ of skin of right lower limb, including hip: Secondary | ICD-10-CM | POA: Diagnosis not present

## 2017-12-25 DIAGNOSIS — L814 Other melanin hyperpigmentation: Secondary | ICD-10-CM | POA: Diagnosis not present

## 2017-12-25 DIAGNOSIS — D225 Melanocytic nevi of trunk: Secondary | ICD-10-CM | POA: Diagnosis not present

## 2017-12-25 DIAGNOSIS — L570 Actinic keratosis: Secondary | ICD-10-CM | POA: Diagnosis not present

## 2018-01-20 ENCOUNTER — Other Ambulatory Visit: Payer: Self-pay

## 2018-01-20 ENCOUNTER — Ambulatory Visit (INDEPENDENT_AMBULATORY_CARE_PROVIDER_SITE_OTHER): Payer: PPO | Admitting: Family Medicine

## 2018-01-20 ENCOUNTER — Encounter: Payer: Self-pay | Admitting: Family Medicine

## 2018-01-20 VITALS — BP 128/80 | HR 78 | Resp 16 | Ht 64.0 in | Wt 133.1 lb

## 2018-01-20 DIAGNOSIS — E039 Hypothyroidism, unspecified: Secondary | ICD-10-CM | POA: Diagnosis not present

## 2018-01-20 DIAGNOSIS — R030 Elevated blood-pressure reading, without diagnosis of hypertension: Secondary | ICD-10-CM

## 2018-01-20 NOTE — Patient Instructions (Signed)
Follow up in January for your complete physical Keep up the good work!  You look great! BP is normal! Continue the thyroid medication daily Call with any questions or concerns [place]!!

## 2018-01-20 NOTE — Assessment & Plan Note (Signed)
New.  Noted on last labs.  Pt was started on Levothyroxine 28mcg daily.  Repeat TSH was back WNL.  Pt to continue levothyroxine daily

## 2018-01-20 NOTE — Progress Notes (Signed)
   Subjective:    Patient ID: [name], female    DOB: [birth year], 72 y.o.   MRN: [id number]  HPI Elevated BP- pt was concerned at last visit that BP was elevated.  Today is WNL.  No CP, SOB, HAs, visual changes.  Hypothyroid- noted at last visit.  Tolerating Levothyroxine w/o difficulty.  Repeat TSH was normal in Feb.   Review of Systems For ROS see HPI     Objective:   Physical Exam  Constitutional: She is oriented to person, place, and time. She appears well-developed and well-nourished. No distress.  HENT:  Head: Normocephalic and atraumatic.  Eyes: Pupils are equal, round, and reactive to light. Conjunctivae and EOM are normal.  Neck: Normal range of motion. Neck supple. No thyromegaly present.  Cardiovascular: Normal rate, regular rhythm, normal heart sounds and intact distal pulses.  No murmur heard. Pulmonary/Chest: Effort normal and breath sounds normal. No respiratory distress.  Abdominal: Soft. She exhibits no distension. There is no tenderness.  Musculoskeletal: She exhibits no edema.  Lymphadenopathy:    She has no cervical adenopathy.  Neurological: She is alert and oriented to person, place, and time.  Skin: Skin is warm and dry.  Psychiatric: She has a normal mood and affect. Her behavior is normal.  Vitals reviewed.         Assessment & Plan:  Elevated BP- BP is normal today.  Asymptomatic.  No need for medication or change in tx.  Will continue to follow at future visits.

## 2018-01-22 DIAGNOSIS — Z1231 Encounter for screening mammogram for malignant neoplasm of breast: Secondary | ICD-10-CM | POA: Diagnosis not present

## 2018-01-30 DIAGNOSIS — M21619 Bunion of unspecified foot: Secondary | ICD-10-CM | POA: Diagnosis not present

## 2018-01-30 DIAGNOSIS — Z4789 Encounter for other orthopedic aftercare: Secondary | ICD-10-CM | POA: Diagnosis not present

## 2018-01-30 DIAGNOSIS — M79672 Pain in left foot: Secondary | ICD-10-CM | POA: Diagnosis not present

## 2018-02-20 ENCOUNTER — Other Ambulatory Visit: Payer: Self-pay | Admitting: Family Medicine

## 2018-02-22 LAB — HM MAMMOGRAPHY: HM Mammogram: NORMAL (ref 0–4)

## 2018-03-21 ENCOUNTER — Other Ambulatory Visit: Payer: Self-pay | Admitting: Family Medicine

## 2018-03-26 DIAGNOSIS — M25552 Pain in left hip: Secondary | ICD-10-CM | POA: Diagnosis not present

## 2018-03-26 DIAGNOSIS — S82832D Other fracture of upper and lower end of left fibula, subsequent encounter for closed fracture with routine healing: Secondary | ICD-10-CM | POA: Diagnosis not present

## 2018-03-26 DIAGNOSIS — M25561 Pain in right knee: Secondary | ICD-10-CM | POA: Diagnosis not present

## 2018-05-19 DIAGNOSIS — M25551 Pain in right hip: Secondary | ICD-10-CM | POA: Diagnosis not present

## 2018-05-26 DIAGNOSIS — M25551 Pain in right hip: Secondary | ICD-10-CM | POA: Diagnosis not present

## 2018-05-29 DIAGNOSIS — M25551 Pain in right hip: Secondary | ICD-10-CM | POA: Diagnosis not present

## 2018-06-03 DIAGNOSIS — M25551 Pain in right hip: Secondary | ICD-10-CM | POA: Diagnosis not present

## 2018-06-05 DIAGNOSIS — M25551 Pain in right hip: Secondary | ICD-10-CM | POA: Diagnosis not present

## 2018-07-02 DIAGNOSIS — M25551 Pain in right hip: Secondary | ICD-10-CM | POA: Diagnosis not present

## 2018-07-04 DIAGNOSIS — D0472 Carcinoma in situ of skin of left lower limb, including hip: Secondary | ICD-10-CM | POA: Diagnosis not present

## 2018-07-04 DIAGNOSIS — D2272 Melanocytic nevi of left lower limb, including hip: Secondary | ICD-10-CM | POA: Diagnosis not present

## 2018-07-04 DIAGNOSIS — L309 Dermatitis, unspecified: Secondary | ICD-10-CM | POA: Diagnosis not present

## 2018-07-04 DIAGNOSIS — L570 Actinic keratosis: Secondary | ICD-10-CM | POA: Diagnosis not present

## 2018-07-04 DIAGNOSIS — D0471 Carcinoma in situ of skin of right lower limb, including hip: Secondary | ICD-10-CM | POA: Diagnosis not present

## 2018-07-04 DIAGNOSIS — D225 Melanocytic nevi of trunk: Secondary | ICD-10-CM | POA: Diagnosis not present

## 2018-07-04 DIAGNOSIS — Z85828 Personal history of other malignant neoplasm of skin: Secondary | ICD-10-CM | POA: Diagnosis not present

## 2018-07-21 DIAGNOSIS — D0471 Carcinoma in situ of skin of right lower limb, including hip: Secondary | ICD-10-CM | POA: Diagnosis not present

## 2018-07-21 DIAGNOSIS — L570 Actinic keratosis: Secondary | ICD-10-CM | POA: Diagnosis not present

## 2018-07-21 DIAGNOSIS — D0472 Carcinoma in situ of skin of left lower limb, including hip: Secondary | ICD-10-CM | POA: Diagnosis not present

## 2018-08-14 DIAGNOSIS — Z08 Encounter for follow-up examination after completed treatment for malignant neoplasm: Secondary | ICD-10-CM | POA: Diagnosis not present

## 2018-08-14 DIAGNOSIS — Z85828 Personal history of other malignant neoplasm of skin: Secondary | ICD-10-CM | POA: Diagnosis not present

## 2018-10-06 ENCOUNTER — Other Ambulatory Visit: Payer: Self-pay | Admitting: Nurse Practitioner

## 2018-10-09 ENCOUNTER — Other Ambulatory Visit: Payer: Self-pay | Admitting: Family Medicine

## 2018-10-13 ENCOUNTER — Ambulatory Visit: Payer: PPO | Admitting: Nurse Practitioner

## 2018-10-19 NOTE — Progress Notes (Signed)
[facility]  PATIENT: [name] DOB: Apr 11, 1946   REASON FOR VISIT: Follow-up for insomnia HISTORY FROM: Patient    HISTORY OF PRESENT ILLNESS:UPDATE 12/30/2019CM [name], 72 year old female returns for follow-up with a history of chronic insomnia treated with Seroquel.  Her insomnia is well controlled.  She also has a history of cervical spine disease that has responded to tizanidine.  She remains very active.  She continues to exercise daily.  She returns for reevaluation    10/07/17 CMLee D Sanders is a 72 y.o. female Is seen here as a yearly revisit with  chronic insomnia, treated with Seroquel . Marland KitchenShe reports cyclic insomnia, and related to stressful situation. Her father was an alcoholic and air Customer service manager. The patient moved a lot with her family and has had lifelong fear of going to sleep, related to her experiences with an intoxicated father coming home and being belligerent, but she was never physically abused. Marland Kitchen  She was born on [place], her brother in [place] and her sister in [place]. Tizanidine has helped for her symptoms of cervical spine disease and it also helps her to sleep. She has a history of chronic insomnia and is currently on Seroquel doing well on that medication she continues to exercise daily.  She is going to have bunion surgery after the first of the year . She has no new neurologic complaints   REVIEW OF SYSTEMS: Full 14 system review of systems performed and notable only for those listed, all others are neg:  Constitutional: neg  Cardiovascular: neg Ear/Nose/Throat: neg  Skin: neg Eyes: neg Respiratory: neg Gastroitestinal: neg  Hematology/Lymphatic: neg  Endocrine: neg Musculoskeletal:neg Allergy/Immunology: neg Neurological: neg Psychiatric: neg Sleep : Insomnia   ALLERGIES: Allergies  Allergen Reactions  . Codeine Nausea And Vomiting  . Formaldehyde Other (See Comments)    Allergy testing, unknown  reaction    HOME MEDICATIONS: Outpatient Medications Prior to Visit  Medication Sig Dispense Refill  . Calcium Carbonate-Vitamin D (CALCIUM + D PO) Take 1 tablet by mouth daily.     . Cholecalciferol (VITAMIN D PO) Take by mouth.    . Evening Primrose Oil CAPS Take 2 capsules by mouth daily.    Marland Kitchen GLUCOSAMINE PO Take by mouth.    . levothyroxine (SYNTHROID, LEVOTHROID) 50 MCG tablet TAKE 1 TABLET(50 MCG) BY MOUTH DAILY 30 tablet 6  . MILK THISTLE PO Take by mouth.    . Multiple Vitamin (MULTIVITAMIN) capsule Take 1 capsule by mouth daily.    . Omega-3 Fatty Acids (OMEGA 3 PO) Take by mouth.    . QUEtiapine (SEROQUEL) 25 MG tablet TAKE 3 TABLETS(75 MG) BY MOUTH AT BEDTIME 270 tablet 3  . tiZANidine (ZANAFLEX) 4 MG tablet TAKE 1 TABLET(4 MG) BY MOUTH AT BEDTIME 90 tablet 3  . triamterene-hydrochlorothiazide (DYAZIDE) 37.5-25 MG capsule Take 1 each (1 capsule total) by mouth every morning. 90 capsule 4   No facility-administered medications prior to visit.     PAST MEDICAL HISTORY: Past Medical History:  Diagnosis Date  . Arthritis    Dr Estanislado Pandy  . Cataract   . Fracture 10/2016   left ankel  . History of hiatal hernia    found on endoscopy  . Hypothyroidism   . Insomnia due to anxiety and fear   . Snoring 09/29/2014    PAST SURGICAL HISTORY: Past Surgical History:  Procedure Laterality Date  . ABDOMINAL HYSTERECTOMY  1987   TAH,BSO, APPENDECTOMY  . APPENDECTOMY  1987  APPENDECTOMY AT [facility],BSO  . CATARACT EXTRACTION, BILATERAL Bilateral    20 plus years ago  . COLONOSCOPY      X 3; [facility]. All negative  . KNEE ARTHROSCOPY Bilateral   . METATARSAL OSTEOTOMY WITH BUNIONECTOMY Left 10/31/2017   Procedure: Left First Metatarsal Scarf, Modified McBride and Akin Osteotomies;  Surgeon: Wylene Simmer, MD;  Location: [facility];  Service: Orthopedics;  Laterality: Left;  . REPLACEMENT TOTAL KNEE Bilateral    X 1 each; Dr Percell Miller  . ROTATOR CUFF REPAIR Right 2010     FAMILY HISTORY: Family History  Problem Relation Age of Onset  . Ovarian cancer Mother 69  . Alcohol abuse Father   . Cirrhosis Father   . Stroke Sister 10  . Diabetes Paternal Grandmother   . Heart disease Neg Hx   . Hypertension Neg Hx   . Hyperlipidemia Neg Hx   . Colon cancer Neg Hx     SOCIAL HISTORY: Social History   Socioeconomic History  . Marital status: Married    Spouse name: Not on file  . Number of children: 0  . Years of education: Not on file  . Highest education level: Not on file  Occupational History  . Occupation: retired    Fish farm manager: NOT EMPLOYED  Social Needs  . Financial resource strain: Not on file  . Food insecurity:    Worry: Not on file    Inability: Not on file  . Transportation needs:    Medical: Not on file    Non-medical: Not on file  Tobacco Use  . Smoking status: Never Smoker  . Smokeless tobacco: Never Used  Substance and Sexual Activity  . Alcohol use: Yes    Alcohol/week: 5.0 standard drinks    Types: 1 Glasses of wine, 4 Standard drinks or equivalent per week    Comment: 1 drink nightly  . Drug use: No  . Sexual activity: Not Currently    Birth control/protection: Surgical    Comment: not much  Lifestyle  . Physical activity:    Days per week: Not on file    Minutes per session: Not on file  . Stress: Not on file  Relationships  . Social connections:    Talks on phone: Not on file    Gets together: Not on file    Attends religious service: Not on file    Active member of club or organization: Not on file    Attends meetings of clubs or organizations: Not on file    Relationship status: Not on file  . Intimate partner violence:    Fear of current or ex partner: Not on file    Emotionally abused: Not on file    Physically abused: Not on file    Forced sexual activity: Not on file  Other Topics Concern  . Not on file  Social History Narrative  . Not on file     PHYSICAL EXAM  Vitals:   10/20/18 1249  BP:  140/86  Pulse: 85  Weight: 130 lb 3.2 oz (59.1 kg)  Height: 5\' 4"  (1.626 m)   Body mass index is 22.35 kg/m. General: well developed, well nourished, seated, in no evident distress Head: head normocephalic and atraumatic. Oropharynx benign Neck: supple  Musculoskeletal skeletal changes in the hands fingers shoulders and the knees  Neurologic Exam Mental Status: Awake and fully alert. Oriented to place and time. Follows all commands Mood and affect appropriate.  Cranial Nerves:Pupils equal, briskly reactive to light.  Extraocular movements full without nystagmus. Visual fields full to confrontation. Hearing intact and symmetric to finger snap. Facial sensation intact. Face, tongue, palate move normally and symmetrically. Neck flexion and extension normal.  Motor: Normal bulk and tone. Normal strength in all tested extremity muscles. No focal weakness Sensory.: intact to touch and pinprick and vibratory.  Coordination: Rapid alternating movements normal in all extremities. Finger-to-nose and heel-to-shin performed accurately bilaterally. Gait and Station: Arises from chair without difficulty. Stance is normal. Gait demonstrates normal stride length and balance . Able to heel, toe and tandem walk without difficulty.  Reflexes: 1+ and symmetric. Toes downgoing.  DIAGNOSTIC DATA (LABS, IMAGING, TESTING) -  ASSESSMENT AND PLAN  72 y.o. year old female  has a past medical history of Arthritis;  Insomnia due to anxiety and fear; and cervical spine disease here to follow up.  Blood pressure mildly elevated today.  Continue Seroquel at current dose will refill Continue Zanaflex  At current dose will refill At your blood pressure checked at the drugstore when you pick up your meds.  Follow-up with primary care if elevated. F/U yearly Dennie Bible, [facility], [facility], APRN  [facility] [address], [place], [place] [address] [phone]

## 2018-10-20 ENCOUNTER — Ambulatory Visit: Payer: PPO | Admitting: Nurse Practitioner

## 2018-10-20 ENCOUNTER — Encounter: Payer: Self-pay | Admitting: Nurse Practitioner

## 2018-10-20 VITALS — BP 140/86 | HR 85 | Ht 64.0 in | Wt 130.2 lb

## 2018-10-20 DIAGNOSIS — M5020 Other cervical disc displacement, unspecified cervical region: Secondary | ICD-10-CM

## 2018-10-20 DIAGNOSIS — F5105 Insomnia due to other mental disorder: Secondary | ICD-10-CM

## 2018-10-20 DIAGNOSIS — F409 Phobic anxiety disorder, unspecified: Secondary | ICD-10-CM | POA: Diagnosis not present

## 2018-10-20 MED ORDER — QUETIAPINE FUMARATE 25 MG PO TABS: ORAL_TABLET | ORAL | 3 refills | Status: DC

## 2018-10-20 MED ORDER — TIZANIDINE HCL 4 MG PO TABS: ORAL_TABLET | ORAL | 3 refills | Status: DC

## 2018-10-20 NOTE — Patient Instructions (Signed)
Continue Seroquel at current dose will refill Continue Zanaflex  At current dose will refill F/U year

## 2018-10-24 ENCOUNTER — Other Ambulatory Visit: Payer: Self-pay

## 2018-10-24 ENCOUNTER — Encounter: Payer: Self-pay | Admitting: Family Medicine

## 2018-10-24 ENCOUNTER — Ambulatory Visit (INDEPENDENT_AMBULATORY_CARE_PROVIDER_SITE_OTHER): Payer: PPO | Admitting: Family Medicine

## 2018-10-24 VITALS — BP 120/70 | HR 79 | Temp 99.1°F | Resp 14 | Ht 64.0 in | Wt 126.6 lb

## 2018-10-24 DIAGNOSIS — E785 Hyperlipidemia, unspecified: Secondary | ICD-10-CM

## 2018-10-24 DIAGNOSIS — E039 Hypothyroidism, unspecified: Secondary | ICD-10-CM | POA: Diagnosis not present

## 2018-10-24 DIAGNOSIS — M8580 Other specified disorders of bone density and structure, unspecified site: Secondary | ICD-10-CM

## 2018-10-24 DIAGNOSIS — Z0000 Encounter for general adult medical examination without abnormal findings: Secondary | ICD-10-CM | POA: Diagnosis not present

## 2018-10-24 LAB — CBC WITH DIFFERENTIAL/PLATELET
Basophils Absolute: 0 10*3/uL (ref 0.0–0.1)
Basophils Relative: 0.4 % (ref 0.0–3.0)
EOS ABS: 0.2 10*3/uL (ref 0.0–0.7)
Eosinophils Relative: 2.6 % (ref 0.0–5.0)
HCT: 43.3 % (ref 36.0–46.0)
Hemoglobin: 14.9 g/dL (ref 12.0–15.0)
Lymphocytes Relative: 16.5 % (ref 12.0–46.0)
Lymphs Abs: 1.1 10*3/uL (ref 0.7–4.0)
MCHC: 34.3 g/dL (ref 30.0–36.0)
MCV: 99.1 fl (ref 78.0–100.0)
Monocytes Absolute: 0.7 10*3/uL (ref 0.1–1.0)
Monocytes Relative: 10 % (ref 3.0–12.0)
Neutro Abs: 4.7 10*3/uL (ref 1.4–7.7)
Neutrophils Relative %: 70.5 % (ref 43.0–77.0)
Platelets: 213 10*3/uL (ref 150.0–400.0)
RBC: 4.37 Mil/uL (ref 3.87–5.11)
RDW: 14.1 % (ref 11.5–15.5)
WBC: 6.6 10*3/uL (ref 4.0–10.5)

## 2018-10-24 LAB — BASIC METABOLIC PANEL
BUN: 18 mg/dL (ref 6–23)
CHLORIDE: 97 meq/L (ref 96–112)
CO2: 29 meq/L (ref 19–32)
CREATININE: 0.71 mg/dL (ref 0.40–1.20)
Calcium: 10.2 mg/dL (ref 8.4–10.5)
GFR: 85.9 mL/min (ref >60.00)
Glucose, Bld: 89 mg/dL (ref 70–99)
POTASSIUM: 3.8 meq/L (ref 3.5–5.1)
Sodium: 137 mEq/L (ref 135–145)

## 2018-10-24 LAB — LIPID PANEL
CHOLESTEROL: 235 mg/dL — AB (ref 0–200)
HDL: 112.1 mg/dL (ref >39.00)
LDL Cholesterol: 108 mg/dL — ABNORMAL HIGH (ref 0–99)
NonHDL: 123.26
TRIGLYCERIDES: 78 mg/dL (ref 0.0–149.0)
Total CHOL/HDL Ratio: 2
VLDL: 15.6 mg/dL (ref 0.0–40.0)

## 2018-10-24 LAB — TSH: TSH: 3.23 u[IU]/mL (ref 0.35–4.50)

## 2018-10-24 LAB — HEPATIC FUNCTION PANEL
ALBUMIN: 4.7 g/dL (ref 3.5–5.2)
ALK PHOS: 57 U/L (ref 39–117)
ALT: 21 U/L (ref 0–35)
AST: 11 U/L (ref 0–37)
Bilirubin, Direct: 0.2 mg/dL (ref 0.0–0.3)
Total Bilirubin: 1 mg/dL (ref 0.2–1.2)
Total Protein: 7.1 g/dL (ref 6.0–8.3)

## 2018-10-24 LAB — VITAMIN D 25 HYDROXY (VIT D DEFICIENCY, FRACTURES): VITD: 41.26 ng/mL (ref 30.00–100.00)

## 2018-10-24 NOTE — Assessment & Plan Note (Signed)
Ongoing issue for pt.  UTD on DEXA.  Check Vit D and replete prn.

## 2018-10-24 NOTE — Patient Instructions (Signed)
Follow up in 1 year or as needed We'll notify you of your lab results and make any changes if needed Keep up the good work!  You look great! Call with any questions or concerns Happy New Year!!!

## 2018-10-24 NOTE — Assessment & Plan Note (Signed)
Pt's PE WNL.  UTD on mammo, colonoscopy, DEXA.  Check labs.  Anticipatory guidance provided.

## 2018-10-24 NOTE — Assessment & Plan Note (Signed)
Ongoing issue for pt.  Currently asymptomatic.  Check labs.  Adjust meds prn

## 2018-10-24 NOTE — Progress Notes (Signed)
   Subjective:    Patient ID: [name], female    DOB: November 07, 1945, 73 y.o.   MRN: [id number]  HPI CPE- UTD on mammo, colonoscopy, DEXA, immunizations.   Review of Systems Patient reports no vision/ hearing changes, adenopathy,fever, weight change,  persistant/recurrent hoarseness , swallowing issues, chest pain, palpitations, edema, persistant/recurrent cough, hemoptysis, dyspnea (rest/exertional/paroxysmal nocturnal), gastrointestinal bleeding (melena, rectal bleeding), abdominal pain, significant heartburn, bowel changes, GU symptoms (dysuria, hematuria, incontinence), Gyn symptoms (abnormal  bleeding, pain),  syncope, focal weakness, memory loss, numbness & tingling, skin/hair/nail changes, abnormal bruising or bleeding, anxiety, or depression.     Objective:   Physical Exam General Appearance:    Alert, cooperative, no distress, appears stated age  Head:    Normocephalic, without obvious abnormality, atraumatic  Eyes:    PERRL, conjunctiva/corneas clear, EOM's intact, fundi    benign, both eyes  Ears:    Normal TM's and external ear canals, both ears  Nose:   Nares normal, septum midline, mucosa normal, no drainage    or sinus tenderness  Throat:   Lips, mucosa, and tongue normal; teeth and gums normal  Neck:   Supple, symmetrical, trachea midline, no adenopathy;    Thyroid: no enlargement/tenderness/nodules  Back:     Symmetric, no curvature, ROM normal, no CVA tenderness  Lungs:     Clear to auscultation bilaterally, respirations unlabored  Chest Wall:    No tenderness or deformity   Heart:    Regular rate and rhythm, S1 and S2 normal, no murmur, rub   or gallop  Breast Exam:    Deferred to mammo  Abdomen:     Soft, non-tender, bowel sounds active all four quadrants,    no masses, no organomegaly  Genitalia:    Deferred  Rectal:    Extremities:   Extremities normal, atraumatic, no cyanosis or edema  Pulses:   2+ and symmetric all extremities  Skin:   Skin color, texture,  turgor normal, no rashes or lesions  Lymph nodes:   Cervical, supraclavicular, and axillary nodes normal  Neurologic:   CNII-XII intact, normal strength, sensation and reflexes    throughout          Assessment & Plan:

## 2018-10-27 ENCOUNTER — Encounter: Payer: Self-pay | Admitting: General Practice

## 2018-12-08 DIAGNOSIS — M25572 Pain in left ankle and joints of left foot: Secondary | ICD-10-CM | POA: Diagnosis not present

## 2018-12-17 DIAGNOSIS — Z9849 Cataract extraction status, unspecified eye: Secondary | ICD-10-CM | POA: Diagnosis not present

## 2018-12-17 DIAGNOSIS — Z961 Presence of intraocular lens: Secondary | ICD-10-CM | POA: Diagnosis not present

## 2018-12-17 DIAGNOSIS — H43813 Vitreous degeneration, bilateral: Secondary | ICD-10-CM | POA: Diagnosis not present

## 2019-02-02 ENCOUNTER — Encounter: Payer: PPO | Admitting: Women's Health

## 2019-02-23 ENCOUNTER — Other Ambulatory Visit: Payer: Self-pay

## 2019-02-23 ENCOUNTER — Encounter: Payer: Self-pay | Admitting: Family Medicine

## 2019-02-23 ENCOUNTER — Ambulatory Visit (INDEPENDENT_AMBULATORY_CARE_PROVIDER_SITE_OTHER): Payer: PPO | Admitting: Family Medicine

## 2019-02-23 VITALS — Temp 98.2°F | Ht 64.0 in | Wt 130.0 lb

## 2019-02-23 DIAGNOSIS — W57XXXA Bitten or stung by nonvenomous insect and other nonvenomous arthropods, initial encounter: Secondary | ICD-10-CM | POA: Diagnosis not present

## 2019-02-23 DIAGNOSIS — S80869A Insect bite (nonvenomous), unspecified lower leg, initial encounter: Secondary | ICD-10-CM

## 2019-02-23 MED ORDER — CETIRIZINE HCL 10 MG PO TABS: 10.0000 mg | ORAL_TABLET | Freq: Every day | ORAL | 11 refills | Status: DC

## 2019-02-23 MED ORDER — TRIAMCINOLONE ACETONIDE 0.1 % EX OINT: 1.0000 "application " | TOPICAL_OINTMENT | Freq: Two times a day (BID) | CUTANEOUS | 1 refills | Status: AC

## 2019-02-23 NOTE — Progress Notes (Signed)
I have discussed the procedure for the virtual visit with the patient who has given consent to proceed with assessment and treatment.   Jahzir Strohmeier L Phyllistine Domingos, CMA     

## 2019-02-23 NOTE — Progress Notes (Signed)
Virtual Visit via Video   I connected with patient on 02/23/19 at  8:20 AM EDT by a video enabled telemedicine application and verified that I am speaking with the correct person using two identifiers.  Location patient: Home Location provider: Acupuncturist, Office Persons participating in the virtual visit: Patient, Provider, [place] (Jess B)  I discussed the limitations of evaluation and management by telemedicine and the availability of in person appointments. The patient expressed understanding and agreed to proceed.  Subjective:   HPI:   Red bumps- initially thought she was getting mosquito bites.  Husband was not getting any.  This has been worsening over the last few weeks- arms, legs, feet.  Now look like 'whelps'.  + itching.  Worse at night.  Pt is sure this isn't fleas.  Doesn't feel any bites or stings.  Pt has not taken any OTC medications or products.  Denies overt increase in stress.  No change in products.  Denies gardening or known exposures.  Husband texted pictures that appear to be bites w/ localized reaction  ROS:   See pertinent positives and negatives per HPI.  Patient Active Problem List   Diagnosis Date Noted  . Hypothyroid 01/20/2018  . Physical exam 10/23/2017  . Hypercalciuria 10/23/2017  . Snoring 09/29/2014  . Nonspecific elevation of levels of transaminase or lactic acid dehydrogenase (LDH) 02/09/2014  . Insomnia, persistent 09/23/2013  . Insomnia due to anxiety and fear   . Chest pain 06/17/2013  . GERD (gastroesophageal reflux disease) 05/04/2013  . Displacement of cervical intervertebral disc without myelopathy 03/12/2013  . Osteopenia   . Endometriosis     Social History   Tobacco Use  . Smoking status: Never Smoker  . Smokeless tobacco: Never Used  Substance Use Topics  . Alcohol use: Yes    Alcohol/week: 5.0 standard drinks    Types: 1 Glasses of wine, 4 Standard drinks or equivalent per week    Comment: 1 drink nightly     Current Outpatient Medications:  .  Calcium Carbonate-Vitamin D (CALCIUM + D PO), Take 1 tablet by mouth daily. , Disp: , Rfl:  .  Cholecalciferol (VITAMIN D PO), Take by mouth., Disp: , Rfl:  .  Evening Primrose Oil CAPS, Take 2 capsules by mouth daily., Disp: , Rfl:  .  GLUCOSAMINE PO, Take by mouth., Disp: , Rfl:  .  levothyroxine (SYNTHROID, LEVOTHROID) 50 MCG tablet, TAKE 1 TABLET(50 MCG) BY MOUTH DAILY, Disp: 30 tablet, Rfl: 6 .  MILK THISTLE PO, Take by mouth., Disp: , Rfl:  .  Multiple Vitamin (MULTIVITAMIN) capsule, Take 1 capsule by mouth daily., Disp: , Rfl:  .  Omega-3 Fatty Acids (OMEGA 3 PO), Take by mouth., Disp: , Rfl:  .  QUEtiapine (SEROQUEL) 25 MG tablet, Take 3 capsules at bedtime, Disp: 270 tablet, Rfl: 3 .  tiZANidine (ZANAFLEX) 4 MG tablet, TAKE 1 TABLET(4 MG) BY MOUTH AT BEDTIME, Disp: 90 tablet, Rfl: 3 .  triamterene-hydrochlorothiazide (DYAZIDE) 37.5-25 MG capsule, Take 1 each (1 capsule total) by mouth every morning., Disp: 90 capsule, Rfl: 4  Allergies  Allergen Reactions  . Codeine Nausea And Vomiting  . Formaldehyde Other (See Comments)    Allergy testing, unknown reaction    Objective:   Temp 98.2 F (36.8 C) (Oral)   Ht 5\' 4"  (1.626 m)   Wt 130 lb (59 kg)   BMI 22.31 kg/m   AAOx3, NAD NCAT, EOMI No obvious CN deficits Coloring WNL Pt is able to speak  clearly, coherently without shortness of breath or increased work of breathing.  Clustered, well demarcated papules w/ surrounding localized reactions Thought process is linear.  Mood is appropriate.   Assessment and Plan:   Bites- based on the pictures sent, these appear to be insect bites w/ localized reactions.  They are only occurring on exposed areas- arms, lower legs, ankles/feet.  Will start Triamcinolone ointment BID.  Add Claritin or Zyrtec daily to improve itching.  Encouraged bug spray or avoidance.  Reviewed supportive care and red flags that should prompt return.  Pt expressed  understanding and is in agreement w/ plan.    Annye Asa, MD 02/23/2019

## 2019-02-24 ENCOUNTER — Ambulatory Visit: Payer: Self-pay | Admitting: Family Medicine

## 2019-02-24 NOTE — Telephone Encounter (Signed)
Pt was offered a VV with cody this afternoon she didn't want to do that she wanted to come in the office.   Can we scheduled an in office visit with Tabori on Wednesday?   Pt was advised to stop the Kenalog.   Pt can be reached at the home #

## 2019-02-24 NOTE — Telephone Encounter (Signed)
Pt called in stating she did a virtual visit with Dr. Birdie Riddle yesterday.   She was prescribed Kenalog 0.1 ointment and Zyrtec. When she started using the Kenalog ointment she began to form blisters.  Is this a reaction  To the ointment?  I warm transferred her call to Dr. Virgil Benedict office and connected her with [facility] for further disposition.  I sent my notes to Dr. Virgil Benedict office.  Reason for Disposition . Caller has URGENT medication question about med that PCP prescribed and triager unable to answer question  Answer Assessment - Initial Assessment Questions 1. SYMPTOMS: "Do you have any symptoms?"     I'm starting to get whelps.   I did a virtual visit with Dr. Birdie Riddle.  I'm using the ointment she prescribed.   I'm now getting blisters.   2. SEVERITY: If symptoms are present, ask "Are they mild, moderate or severe?"     The blisters like a whelp.   It looks like a blister.   On my legs and arms.  Yesterday.  Protocols used: MEDICATION QUESTION CALL-A-AH

## 2019-02-25 NOTE — Telephone Encounter (Signed)
Could you see if 8am is ok with pt?

## 2019-02-25 NOTE — Telephone Encounter (Signed)
Pt states that she has an appt with her dermatologist and one is not needed with Dr. Birdie Riddle

## 2019-02-25 NOTE — Telephone Encounter (Signed)
I do not have anything available today (Wednesday) but she can come into the office tomorrow (Thursday) for an in-person visit.  Preferably at the beginning or end of either session (currently an 8am available)

## 2019-02-26 DIAGNOSIS — L400 Psoriasis vulgaris: Secondary | ICD-10-CM | POA: Diagnosis not present

## 2019-02-26 DIAGNOSIS — S80861A Insect bite (nonvenomous), right lower leg, initial encounter: Secondary | ICD-10-CM | POA: Diagnosis not present

## 2019-03-13 ENCOUNTER — Encounter: Payer: Self-pay | Admitting: Women's Health

## 2019-03-13 DIAGNOSIS — Z1231 Encounter for screening mammogram for malignant neoplasm of breast: Secondary | ICD-10-CM | POA: Diagnosis not present

## 2019-03-20 ENCOUNTER — Other Ambulatory Visit: Payer: Self-pay

## 2019-03-23 ENCOUNTER — Encounter: Payer: Self-pay | Admitting: Women's Health

## 2019-03-23 ENCOUNTER — Other Ambulatory Visit: Payer: Self-pay

## 2019-03-23 ENCOUNTER — Ambulatory Visit (INDEPENDENT_AMBULATORY_CARE_PROVIDER_SITE_OTHER): Payer: PPO | Admitting: Women's Health

## 2019-03-23 VITALS — BP 120/84 | Ht 64.0 in | Wt 132.0 lb

## 2019-03-23 DIAGNOSIS — Z01419 Encounter for gynecological examination (general) (routine) without abnormal findings: Secondary | ICD-10-CM

## 2019-03-23 NOTE — Progress Notes (Signed)
[name] 1946/05/08 [id number]    History:    Presents for annual exam.  Presents for breast and pelvic exam.  1987 TAH with BSO for endometriosis.  Normal Pap and mammogram history.  2014- colonoscopy.  10/2016 fractured ankle from fall.  Works with a Clinical research associate 5 days weekly, does Pilates.    Primary care manages hypertension, GERD, arthritis and hypothyroidism.  03/2017 T score -2.2 at right femoral neck -7% change, left femoral neck stable, FRAX 19% / 4.1%.  Dr. Gale Journey at [facility].  Past medical history, past surgical history, family history and social history were all reviewed and documented in the EPIC chart.  Has 2 horses and fosters horses.  2 dogs.  ROS:  A ROS was performed and pertinent positives and negatives are included.  Exam:  Vitals:   03/23/19 0948  BP: (!) 150/82  Weight: 132 lb (59.9 kg)  Height: 5\' 4"  (1.626 m)   Body mass index is 22.66 kg/m.   General appearance:  Normal Thyroid:  Symmetrical, normal in size, without palpable masses or nodularity. Respiratory  Auscultation:  Clear without wheezing or rhonchi Cardiovascular  Auscultation:  Regular rate, without rubs, murmurs or gallops  Edema/varicosities:  Not grossly evident Abdominal  Soft,nontender, without masses, guarding or rebound.  Liver/spleen:  No organomegaly noted  Hernia:  None appreciated  Skin  Inspection:  Grossly normal   Breasts: Examined lying and sitting.     Right: Without masses, retractions, discharge or axillary adenopathy.     Left: Without masses, retractions, discharge or axillary adenopathy. Gentitourinary   Inguinal/mons:  Normal without inguinal adenopathy  External genitalia:  Normal  BUS/Urethra/Skene's glands:  Normal  Vagina:  Normal  Cervix: And uterus absent  Adnexa/parametria:     Rt: Without masses or tenderness.   Lt: Without masses or tenderness.  Anus and perineum: Normal  Digital rectal exam: Normal sphincter tone without palpated masses or  tenderness  Assessment/Plan:  73 y.o. MWF G0 for breast and pelvic exam with no complaints.  Postmenopausal on no HRT. Arthritis, GERD, hypertension-primary care manages labs and meds Osteopenia with elevated FRAX-Dr. Gale Journey manages  Plan: Blood pressure elevated, keep scheduled follow-up with primary care and monitor at home.  SBEs, continue annual screening mammogram, calcium rich foods, vitamin D 2000 daily encouraged.  Encouraged to continue yoga, regular exercise.  Home safety, fall prevention, weightbearing and balance type exercise discussed.   [place], 10:09 AM 03/23/2019

## 2019-03-23 NOTE — Patient Instructions (Signed)
Health Maintenance After Age 73 After age 73, you are at a higher risk for certain long-term diseases and infections as well as injuries from falls. Falls are a major cause of broken bones and head injuries in people who are older than age 73. Getting regular preventive care can help to keep you healthy and well. Preventive care includes getting regular testing and making lifestyle changes as recommended by your health care provider. Talk with your health care provider about:  Which screenings and tests you should have. A screening is a test that checks for a disease when you have no symptoms.  A diet and exercise plan that is right for you. What should I know about screenings and tests to prevent falls? Screening and testing are the best ways to find a health problem early. Early diagnosis and treatment give you the best chance of managing medical conditions that are common after age 73. Certain conditions and lifestyle choices may make you more likely to have a fall. Your health care provider may recommend:  Regular vision checks. Poor vision and conditions such as cataracts can make you more likely to have a fall. If you wear glasses, make sure to get your prescription updated if your vision changes.  Medicine review. Work with your health care provider to regularly review all of the medicines you are taking, including over-the-counter medicines. Ask your health care provider about any side effects that may make you more likely to have a fall. Tell your health care provider if any medicines that you take make you feel dizzy or sleepy.  Osteoporosis screening. Osteoporosis is a condition that causes the bones to get weaker. This can make the bones weak and cause them to break more easily.  Blood pressure screening. Blood pressure changes and medicines to control blood pressure can make you feel dizzy.  Strength and balance checks. Your health care provider may recommend certain tests to check your  strength and balance while standing, walking, or changing positions.  Foot health exam. Foot pain and numbness, as well as not wearing proper footwear, can make you more likely to have a fall.  Depression screening. You may be more likely to have a fall if you have a fear of falling, feel emotionally low, or feel unable to do activities that you used to do.  Alcohol use screening. Using too much alcohol can affect your balance and may make you more likely to have a fall. What actions can I take to lower my risk of falls? General instructions  Talk with your health care provider about your risks for falling. Tell your health care provider if: ? You fall. Be sure to tell your health care provider about all falls, even ones that seem minor. ? You feel dizzy, sleepy, or off-balance.  Take over-the-counter and prescription medicines only as told by your health care provider. These include any supplements.  Eat a healthy diet and maintain a healthy weight. A healthy diet includes low-fat dairy products, low-fat (lean) meats, and fiber from whole grains, beans, and lots of fruits and vegetables. Home safety  Remove any tripping hazards, such as rugs, cords, and clutter.  Install safety equipment such as grab bars in bathrooms and safety rails on stairs.  Keep rooms and walkways well-lit. Activity   Follow a regular exercise program to stay fit. This will help you maintain your balance. Ask your health care provider what types of exercise are appropriate for you.  If you need a cane or   walker, use it as recommended by your health care provider.  Wear supportive shoes that have nonskid soles. Lifestyle  Do not drink alcohol if your health care provider tells you not to drink.  If you drink alcohol, limit how much you have: ? 0-1 drink a day for women. ? 0-2 drinks a day for men.  Be aware of how much alcohol is in your drink. In the [place] one drink equals one typical bottle of beer (12  oz), one-half glass of wine (5 oz), or one shot of hard liquor (1 oz).  Do not use any products that contain nicotine or tobacco, such as cigarettes and e-cigarettes. If you need help quitting, ask your health care provider. Summary  Having a healthy lifestyle and getting preventive care can help to protect your health and wellness after age 73.  Screening and testing are the best way to find a health problem early and help you avoid having a fall. Early diagnosis and treatment give you the best chance for managing medical conditions that are more common for people who are older than age 73.  Falls are a major cause of broken bones and head injuries in people who are older than age 73. Take precautions to prevent a fall at home.  Work with your health care provider to learn what changes you can make to improve your health and wellness and to prevent falls. This information is not intended to replace advice given to you by your health care provider. Make sure you discuss any questions you have with your health care provider. Document Released: 08/21/2017 Document Revised: 08/21/2017 Document Reviewed: 08/21/2017 Elsevier Interactive Patient Education  2019 Elsevier Inc.  

## 2019-03-24 LAB — URINALYSIS, COMPLETE W/RFL CULTURE
Bacteria, UA: NONE SEEN /HPF
Bilirubin Urine: NEGATIVE
Glucose, UA: NEGATIVE
Hgb urine dipstick: NEGATIVE
Hyaline Cast: NONE SEEN /LPF
Ketones, ur: NEGATIVE
Leukocyte Esterase: NEGATIVE
Nitrites, Initial: NEGATIVE
Protein, ur: NEGATIVE
RBC / HPF: NONE SEEN /HPF (ref 0–2)
Specific Gravity, Urine: 1.016 (ref 1.001–1.03)
Squamous Epithelial / HPF: NONE SEEN /HPF (ref <=5)
WBC, UA: NONE SEEN /HPF (ref 0–5)
pH: >=8.5 — AB (ref 5.0–8.0)

## 2019-03-24 LAB — NO CULTURE INDICATED

## 2019-05-01 ENCOUNTER — Other Ambulatory Visit: Payer: Self-pay | Admitting: Family Medicine

## 2019-05-04 ENCOUNTER — Telehealth: Payer: Self-pay | Admitting: Family Medicine

## 2019-05-04 NOTE — Telephone Encounter (Signed)
Yes.  Blood donation will tell her what type she is AND whether she has COVID antibodies

## 2019-05-04 NOTE — Telephone Encounter (Signed)
Called and advised pt of PCP recommendations. She advised she will contact them.

## 2019-05-04 NOTE — Telephone Encounter (Signed)
Pt called in asking if we have a record of her blood type, she states that she doesn't know what her blood type is and would like to know. If we don't have a record of it she would like to know how to go about getting it and if she should be having bw due to everything that is going on.

## 2019-05-04 NOTE — Telephone Encounter (Signed)
Going to [facility] to donate blood is the quickest and safest way for Blood typing correct?

## 2019-06-08 DIAGNOSIS — M859 Disorder of bone density and structure, unspecified: Secondary | ICD-10-CM | POA: Diagnosis not present

## 2019-06-12 ENCOUNTER — Encounter: Payer: Self-pay | Admitting: Women's Health

## 2019-07-02 DIAGNOSIS — M7712 Lateral epicondylitis, left elbow: Secondary | ICD-10-CM | POA: Diagnosis not present

## 2019-07-28 ENCOUNTER — Encounter: Payer: Self-pay | Admitting: Women's Health

## 2019-07-28 DIAGNOSIS — M1991 Primary osteoarthritis, unspecified site: Secondary | ICD-10-CM | POA: Diagnosis not present

## 2019-07-28 DIAGNOSIS — M25522 Pain in left elbow: Secondary | ICD-10-CM | POA: Diagnosis not present

## 2019-08-05 DIAGNOSIS — D225 Melanocytic nevi of trunk: Secondary | ICD-10-CM | POA: Diagnosis not present

## 2019-08-05 DIAGNOSIS — L814 Other melanin hyperpigmentation: Secondary | ICD-10-CM | POA: Diagnosis not present

## 2019-08-05 DIAGNOSIS — L570 Actinic keratosis: Secondary | ICD-10-CM | POA: Diagnosis not present

## 2019-08-05 DIAGNOSIS — Z85828 Personal history of other malignant neoplasm of skin: Secondary | ICD-10-CM | POA: Diagnosis not present

## 2019-08-12 NOTE — Telephone Encounter (Signed)
I called patient about this and she said [place] had already called her and discussed it with her.

## 2019-08-13 DIAGNOSIS — M8580 Other specified disorders of bone density and structure, unspecified site: Secondary | ICD-10-CM | POA: Diagnosis not present

## 2019-09-16 ENCOUNTER — Ambulatory Visit: Payer: PPO

## 2019-09-28 ENCOUNTER — Other Ambulatory Visit: Payer: Self-pay | Admitting: *Deleted

## 2019-09-28 MED ORDER — QUETIAPINE FUMARATE 25 MG PO TABS: ORAL_TABLET | ORAL | 3 refills | Status: DC

## 2019-10-14 ENCOUNTER — Other Ambulatory Visit: Payer: Self-pay

## 2019-10-14 ENCOUNTER — Ambulatory Visit (INDEPENDENT_AMBULATORY_CARE_PROVIDER_SITE_OTHER): Payer: PPO

## 2019-10-14 VITALS — BP 136/72 | HR 84 | Temp 98.6°F | Ht 64.0 in | Wt 133.8 lb

## 2019-10-14 DIAGNOSIS — Z0000 Encounter for general adult medical examination without abnormal findings: Secondary | ICD-10-CM | POA: Diagnosis not present

## 2019-10-14 NOTE — Patient Instructions (Signed)
[name] , Thank you for taking time to come for your Medicare Wellness Visit. I appreciate your ongoing commitment to your health goals. Please review the following plan we discussed and let me know if I can assist you in the future.   Screening recommendations/referrals: Colorectal Screening: up to date; last colonoscopy 03/10/13 with Dr. Fuller Plan  Mammogram: up to date; last 03/13/19 Bone Density: up to date; last 07/28/19  Vision and Dental Exams: Recommended annual ophthalmology exams for early detection of glaucoma and other disorders of the eye Recommended annual dental exams for proper oral hygiene  Vaccinations: Influenza vaccine: completed 06/10/19 Pneumococcal vaccine: up to date; last 08/12/14 Tdap vaccine: up to date; last 08/12/14 Shingles vaccine: Shingrix completed   Advanced directives: Please bring a copy of your POA (Power of [place]) and/or Living Will to your next appointment.  Goals: Recommend to drink at least 6-8 8oz glasses of water per day and consume a balanced diet rich in fresh fruits and vegetables.   Next appointment: Please schedule your Annual Wellness Visit with your Nurse Health Advisor in one year.  Preventive Care 15 Years and Older, Female Preventive care refers to lifestyle choices and visits with your health care provider that can promote health and wellness. What does preventive care include?  A yearly physical exam. This is also called an annual well check.  Dental exams once or twice a year.  Routine eye exams. Ask your health care provider how often you should have your eyes checked.  Personal lifestyle choices, including:  Daily care of your teeth and gums.  Regular physical activity.  Eating a healthy diet.  Avoiding tobacco and drug use.  Limiting alcohol use.  Practicing safe sex.  Taking low-dose aspirin every day if recommended by your health care provider.  Taking vitamin and mineral supplements as recommended by your  health care provider. What happens during an annual well check? The services and screenings done by your health care provider during your annual well check will depend on your age, overall health, lifestyle risk factors, and family history of disease. Counseling  Your health care provider may ask you questions about your:  Alcohol use.  Tobacco use.  Drug use.  Emotional well-being.  Home and relationship well-being.  Sexual activity.  Eating habits.  History of falls.  Memory and ability to understand (cognition).  Work and work Statistician.  Reproductive health. Screening  You may have the following tests or measurements:  Height, weight, and BMI.  Blood pressure.  Lipid and cholesterol levels. These may be checked every 5 years, or more frequently if you are over 25 years old.  Skin check.  Lung cancer screening. You may have this screening every year starting at age 47 if you have a 30-pack-year history of smoking and currently smoke or have quit within the past 15 years.  Fecal occult blood test (FOBT) of the stool. You may have this test every year starting at age 29.  Flexible sigmoidoscopy or colonoscopy. You may have a sigmoidoscopy every 5 years or a colonoscopy every 10 years starting at age 10.  Hepatitis C blood test.  Hepatitis B blood test.  Sexually transmitted disease (STD) testing.  Diabetes screening. This is done by checking your blood sugar (glucose) after you have not eaten for a while (fasting). You may have this done every 1-3 years.  Bone density scan. This is done to screen for osteoporosis. You may have this done starting at age 78.  Mammogram. This  may be done every 1-2 years. Talk to your health care provider about how often you should have regular mammograms. Talk with your health care provider about your test results, treatment options, and if necessary, the need for more tests. Vaccines  Your health care provider may recommend  certain vaccines, such as:  Influenza vaccine. This is recommended every year.  Tetanus, diphtheria, and acellular pertussis (Tdap, Td) vaccine. You may need a Td booster every 10 years.  Zoster vaccine. You may need this after age 70.  Pneumococcal 13-valent conjugate (PCV13) vaccine. One dose is recommended after age 70.  Pneumococcal polysaccharide (PPSV23) vaccine. One dose is recommended after age 15. Talk to your health care provider about which screenings and vaccines you need and how often you need them. This information is not intended to replace advice given to you by your health care provider. Make sure you discuss any questions you have with your health care provider. Document Released: 11/04/2015 Document Revised: 06/27/2016 Document Reviewed: 08/09/2015 Elsevier Interactive Patient Education  2017 [employer] Prevention in the Home Falls can cause injuries. They can happen to people of all ages. There are many things you can do to make your home safe and to help prevent falls. What can I do on the outside of my home?  Regularly fix the edges of walkways and driveways and fix any cracks.  Remove anything that might make you trip as you walk through a door, such as a raised step or threshold.  Trim any bushes or trees on the path to your home.  Use bright outdoor lighting.  Clear any walking paths of anything that might make someone trip, such as rocks or tools.  Regularly check to see if handrails are loose or broken. Make sure that both sides of any steps have handrails.  Any raised decks and porches should have guardrails on the edges.  Have any leaves, snow, or ice cleared regularly.  Use sand or salt on walking paths during winter.  Clean up any spills in your garage right away. This includes oil or grease spills. What can I do in the bathroom?  Use night lights.  Install grab bars by the toilet and in the tub and shower. Do not use towel bars as  grab bars.  Use non-skid mats or decals in the tub or shower.  If you need to sit down in the shower, use a plastic, non-slip stool.  Keep the floor dry. Clean up any water that spills on the floor as soon as it happens.  Remove soap buildup in the tub or shower regularly.  Attach bath mats securely with double-sided non-slip rug tape.  Do not have throw rugs and other things on the floor that can make you trip. What can I do in the bedroom?  Use night lights.  Make sure that you have a light by your bed that is easy to reach.  Do not use any sheets or blankets that are too big for your bed. They should not hang down onto the floor.  Have a firm chair that has side arms. You can use this for support while you get dressed.  Do not have throw rugs and other things on the floor that can make you trip. What can I do in the kitchen?  Clean up any spills right away.  Avoid walking on wet floors.  Keep items that you use a lot in easy-to-reach places.  If you need to reach something above  you, use a strong step stool that has a grab bar.  Keep electrical cords out of the way.  Do not use floor polish or wax that makes floors slippery. If you must use wax, use non-skid floor wax.  Do not have throw rugs and other things on the floor that can make you trip. What can I do with my stairs?  Do not leave any items on the stairs.  Make sure that there are handrails on both sides of the stairs and use them. Fix handrails that are broken or loose. Make sure that handrails are as long as the stairways.  Check any carpeting to make sure that it is firmly attached to the stairs. Fix any carpet that is loose or worn.  Avoid having throw rugs at the top or bottom of the stairs. If you do have throw rugs, attach them to the floor with carpet tape.  Make sure that you have a light switch at the top of the stairs and the bottom of the stairs. If you do not have them, ask someone to add them  for you. What else can I do to help prevent falls?  Wear shoes that:  Do not have high heels.  Have rubber bottoms.  Are comfortable and fit you well.  Are closed at the toe. Do not wear sandals.  If you use a stepladder:  Make sure that it is fully opened. Do not climb a closed stepladder.  Make sure that both sides of the stepladder are locked into place.  Ask someone to hold it for you, if possible.  Clearly mark and make sure that you can see:  Any grab bars or handrails.  First and last steps.  Where the edge of each step is.  Use tools that help you move around (mobility aids) if they are needed. These include:  Canes.  Walkers.  Scooters.  Crutches.  Turn on the lights when you go into a dark area. Replace any light bulbs as soon as they burn out.  Set up your furniture so you have a clear path. Avoid moving your furniture around.  If any of your floors are uneven, fix them.  If there are any pets around you, be aware of where they are.  Review your medicines with your doctor. Some medicines can make you feel dizzy. This can increase your chance of falling. Ask your doctor what other things that you can do to help prevent falls. This information is not intended to replace advice given to you by your health care provider. Make sure you discuss any questions you have with your health care provider. Document Released: 08/04/2009 Document Revised: 03/15/2016 Document Reviewed: 11/12/2014 Elsevier Interactive Patient Education  2017 [employer].

## 2019-10-14 NOTE — Progress Notes (Signed)
Subjective:   [name] is a 73 y.o. female who presents for Medicare Annual (Subsequent) preventive examination.  Review of Systems:   Cardiac Risk Factors include: advanced age (>44men, >3 women)    Objective:     Vitals: BP 136/72 (BP Location: Left Arm, Patient Position: Sitting, Cuff Size: Normal)   Pulse 84   Temp 98.6 F (37 C) (Oral)   Ht 5\' 4"  (1.626 m)   Wt 133 lb 12.8 oz (60.7 kg)   SpO2 96%   BMI 22.97 kg/m   Body mass index is 22.97 kg/m.  Advanced Directives 10/14/2019 10/31/2017 07/17/2017 09/29/2015  Does Patient Have a Medical Advance Directive? Yes Yes Yes Yes  Type of Advance Directive Living will;Healthcare Power of [place];Living will [place];Living will [place];Living will  Does patient want to make changes to medical advance directive? No - Patient declined - - -  Copy of [place] in Chart? No - copy requested Yes No - copy requested No - copy requested    Tobacco Social History   Tobacco Use  Smoking Status Never Smoker  Smokeless Tobacco Never Used     Counseling given: Not Answered   Clinical Intake:  Pre-visit preparation completed: Yes  Pain : No/denies pain  Diabetes: No  How often do you need to have someone help you when you read instructions, pamphlets, or other written materials from your doctor or pharmacy?: 1 - Never  Interpreter Needed?: No  Information entered by :: Denman George LPN  Past Medical History:  Diagnosis Date  . Arthritis    Dr Estanislado Pandy  . Cataract   . Fracture 10/2016   left ankel  . History of hiatal hernia    found on endoscopy  . Hypothyroidism   . Insomnia due to anxiety and fear   . Snoring 09/29/2014   Past Surgical History:  Procedure Laterality Date  . ABDOMINAL HYSTERECTOMY  1987   [facility]O, APPENDECTOMY  . APPENDECTOMY  1987   APPENDECTOMY AT [facility],BSO  . CATARACT EXTRACTION, BILATERAL  Bilateral    20 plus years ago  . COLONOSCOPY      X 3; [facility]. All negative  . KNEE ARTHROSCOPY Bilateral   . METATARSAL OSTEOTOMY WITH BUNIONECTOMY Left 10/31/2017   Procedure: Left First Metatarsal Scarf, Modified McBride and Akin Osteotomies;  Surgeon: Wylene Simmer, MD;  Location: [facility];  Service: Orthopedics;  Laterality: Left;  . REPLACEMENT TOTAL KNEE Bilateral    X 1 each; Dr Percell Miller  . ROTATOR CUFF REPAIR Right 2010   Family History  Problem Relation Age of Onset  . Ovarian cancer Mother 77  . Alcohol abuse Father   . Cirrhosis Father   . Stroke Sister 86  . Diabetes Paternal Grandmother   . Heart disease Neg Hx   . Hypertension Neg Hx   . Hyperlipidemia Neg Hx   . Colon cancer Neg Hx    Social History   Socioeconomic History  . Marital status: Married    Spouse name: Not on file  . Number of children: 0  . Years of education: Not on file  . Highest education level: Not on file  Occupational History  . 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 Smoking status: Never Smoker  . Smokeless tobacco: Never Used  Substance and Sexual Activity  . Alcohol use: Yes  Alcohol/week: 5.0 standard drinks    Types: 1 Glasses of wine, 4 Standard drinks or equivalent per week    Comment: 1 drink nightly  . Drug use: No  . Sexual activity: Not Currently    Birth control/protection: Surgical    Comment: not much  Other Topics Concern  . Not on file  Social History Narrative   No children    2 dogs    3 horses    Husband patient of Dr. Birdie Riddle    Social Determinants of Health   Financial Resource Strain:   . Difficulty of Paying Living Expenses: Not on file  Food Insecurity:   . Worried About Charity fundraiser in the Last Year: Not on file  . Ran Out of Food in the Last Year: Not on file  Transportation Needs:   . Lack of Transportation (Medical): Not on file  . Lack of Transportation  (Non-Medical): Not on file  Physical Activity:   . Days of Exercise per Week: Not on file  . Minutes of Exercise per Session: Not on file  Stress:   . Feeling of Stress : Not on file  Social Connections:   . Frequency of Communication with Friends and Family: Not on file  . Frequency of Social Gatherings with Friends and Family: Not on file  . Attends Religious Services: Not on file  . Active Member of Clubs or Organizations: Not on file  . Attends Archivist Meetings: Not on file  . Marital Status: Not on file    Outpatient Encounter Medications as of 10/14/2019  Medication Sig  . Calcium Carbonate-Vitamin D (CALCIUM + D PO) Take 1 tablet by mouth daily.   . cetirizine (ZYRTEC) 10 MG tablet Take 1 tablet (10 mg total) by mouth daily.  . Cholecalciferol (VITAMIN D PO) Take by mouth.  . Evening Primrose Oil CAPS Take 2 capsules by mouth daily.  Marland Kitchen GLUCOSAMINE PO Take by mouth.  . levothyroxine (SYNTHROID) 50 MCG tablet TAKE 1 TABLET(50 MCG) BY MOUTH DAILY  . MILK THISTLE PO Take by mouth.  . Multiple Vitamin (MULTIVITAMIN) capsule Take 1 capsule by mouth daily.  . Omega-3 Fatty Acids (OMEGA 3 PO) Take by mouth.  . QUEtiapine (SEROQUEL) 25 MG tablet Take 3 capsules at bedtime  . tiZANidine (ZANAFLEX) 4 MG tablet TAKE 1 TABLET(4 MG) BY MOUTH AT BEDTIME  . triamcinolone ointment (KENALOG) 0.1 % Apply 1 application topically 2 (two) times daily.  Marland Kitchen triamterene-hydrochlorothiazide (DYAZIDE) 37.5-25 MG capsule Take 1 each (1 capsule total) by mouth every morning.   No facility-administered encounter medications on file as of 10/14/2019.    Activities of Daily Living In your present state of health, do you have any difficulty performing the following activities: 10/14/2019 02/23/2019  Hearing? N N  Vision? N N  Difficulty concentrating or making decisions? N N  Walking or climbing stairs? N N  Dressing or bathing? N N  Doing errands, shopping? N N  Preparing Food and eating ?  N -  Using the Toilet? N -  In the past six months, have you accidently leaked urine? N -  Do you have problems with loss of bowel control? N -  Managing your Medications? N -  Managing your Finances? N -  Housekeeping or managing your Housekeeping? N -  Some recent data might be hidden    Patient Care Team: Midge Minium, MD as PCP - General (Family Medicine) Huel Cote, NP as Nurse Practitioner (Obstetrics  and Gynecology) Hassell Done, Dicky Doe, NP as Nurse Practitioner (Family Medicine) Dohmeier, Asencion Partridge, MD as Consulting Physician (Neurology) Gale Journey, Gwen Her, MD as Referring Physician (Endocrinology) Kelton Pillar, MD as Referring Physician (Orthopedic Surgery) Harriett Sine, MD as Consulting Physician (Dermatology) [place], Heather, [place] as Consulting Physician (Optometry)    Assessment:   This is a routine wellness examination for [employer].  Exercise Activities and Dietary recommendations Current Exercise Habits: Structured exercise class;Home exercise routine, Type of exercise: Other - see comments(personal trainer, Pilates, spinning), Time (Minutes): 60, Frequency (Times/Week): 5, Weekly Exercise (Minutes/Week): 300, Intensity: Moderate  Goals    . Weight (lb) < 125 lb (56.7 kg)     Lose weight by staying active.        Fall Risk Fall Risk  10/14/2019 02/23/2019 10/24/2018 10/23/2017 07/17/2017  Falls in the past year? 0 0 0 No Yes  Number falls in past yr: - 0 - - 2 or more  Injury with Fall? 0 0 - - No  Risk for fall due to : - - - - Impaired balance/gait  Follow up Falls evaluation completed;Education provided;Falls prevention discussed - - - Falls prevention discussed   Is the patient's home free of loose throw rugs in walkways, pet beds, electrical cords, etc?   yes      Grab bars in the bathroom? yes      Handrails on the stairs?   yes      Adequate lighting?   yes  Timed Get Up and Go performed: completed and within normal timeframe; no gait  abnormalities noted   Depression Screen PHQ 2/9 Scores 10/14/2019 02/23/2019 10/24/2018 10/23/2017  PHQ - 2 Score 0 0 0 0  PHQ- 9 Score - - 0 0     Cognitive Function- no cognitive concerns at this time Cognitive Testing  Alert? Yes         Normal Appearance? Yes  Oriented to person? Yes           Place? Yes  Time? Yes  Recall of three objects? Yes  Can perform simple calculations? Yes  Displays appropriate judgment? Yes  Can read the correct time from a watch face? Yes   Immunization History  Administered Date(s) Administered  . Influenza, High Dose Seasonal PF 07/17/2017, 07/13/2018, 06/10/2019  . Influenza,inj,Quad PF,6+ Mos 07/11/2016  . Influenza-Unspecified 08/12/2014, 07/19/2015  . Pneumococcal Conjugate-13 08/12/2014  . Pneumococcal Polysaccharide-23 09/10/2012  . Tdap 08/12/2014  . Zoster 01/18/2012  . Zoster Recombinat (Shingrix) 09/02/2017, 09/27/2019    Qualifies for Shingles Vaccine? Shingrix completed   Screening Tests Health Maintenance  Topic Date Due  . MAMMOGRAM  03/12/2021  . COLONOSCOPY  03/11/2023  . TETANUS/TDAP  08/12/2024  . INFLUENZA VACCINE  Completed  . DEXA SCAN  Completed  . Hepatitis C Screening  Completed  . PNA vac Low Risk Adult  Completed    Cancer Screenings: Lung: Low Dose CT Chest recommended if Age 11-80 years, 30 pack-year currently smoking OR have quit w/in 15years. Patient does not qualify. Breast:  Up to date on Mammogram? Yes   Up to date of Bone Density/Dexa? Yes Colorectal: colonoscopy 03/10/13 with Dr. Fuller Plan      Plan:  I have personally reviewed and addressed the Medicare Annual Wellness questionnaire and have noted the following in the patient's chart:  A. Medical and social history B. Use of alcohol, tobacco or illicit drugs  C. Current medications and supplements D. Functional ability and status E.  Nutritional status F.  Physical  activity G. Advance directives H. List of other physicians I.  Hospitalizations,  surgeries, and ER visits in previous 12 months J.  [place] such as hearing and vision if needed, cognitive and depression L. Referrals, records requested, and appointments- none   In addition, I have reviewed and discussed with patient certain preventive protocols, quality metrics, and best practice recommendations. A written personalized care plan for preventive services as well as general preventive health recommendations were provided to patient.   Signed,  Denman George, LPN  Nurse Health Advisor   Nurse Notes: no additional

## 2019-10-19 ENCOUNTER — Other Ambulatory Visit: Payer: Self-pay

## 2019-10-19 MED ORDER — LEVOTHYROXINE SODIUM 50 MCG PO TABS: ORAL_TABLET | ORAL | 0 refills | Status: DC

## 2019-10-26 ENCOUNTER — Ambulatory Visit: Payer: PPO | Admitting: Neurology

## 2019-10-26 ENCOUNTER — Encounter: Payer: Self-pay | Admitting: Neurology

## 2019-10-26 ENCOUNTER — Other Ambulatory Visit: Payer: Self-pay

## 2019-10-26 VITALS — BP 152/83 | HR 80 | Temp 97.4°F | Ht 64.0 in | Wt 137.0 lb

## 2019-10-26 DIAGNOSIS — F5104 Psychophysiologic insomnia: Secondary | ICD-10-CM

## 2019-10-26 MED ORDER — TIZANIDINE HCL 4 MG PO TABS: ORAL_TABLET | ORAL | 3 refills | Status: DC

## 2019-10-26 MED ORDER — TRAZODONE HCL 50 MG PO TABS: 50.0000 mg | ORAL_TABLET | Freq: Every evening | ORAL | 5 refills | Status: DC | PRN

## 2019-10-26 NOTE — Patient Instructions (Signed)
Trazodone tablets What is this medicine? TRAZODONE (TRAZ oh done) is used to treat depression. This medicine may be used for other purposes; ask your health care provider or pharmacist if you have questions. COMMON BRAND NAME(S): Desyrel What should I tell my health care provider before I take this medicine? They need to know if you have any of these conditions:  attempted suicide or thinking about it  bipolar disorder  bleeding problems  glaucoma  heart disease, or previous heart attack  irregular heart beat  kidney or liver disease  low levels of sodium in the blood  an unusual or allergic reaction to trazodone, other medicines, foods, dyes or preservatives  pregnant or trying to get pregnant  breast-feeding How should I use this medicine? Take this medicine by mouth with a glass of water. Follow the directions on the prescription label. Take this medicine shortly after a meal or a light snack. Take your medicine at regular intervals. Do not take your medicine more often than directed. Do not stop taking this medicine suddenly except upon the advice of your doctor. Stopping this medicine too quickly may cause serious side effects or your condition may worsen. A special MedGuide will be given to you by the pharmacist with each prescription and refill. Be sure to read this information carefully each time. Talk to your pediatrician regarding the use of this medicine in children. Special care may be needed. Overdosage: If you think you have taken too much of this medicine contact a poison control center or emergency room at once. NOTE: This medicine is only for you. Do not share this medicine with others. What if I miss a dose? If you miss a dose, take it as soon as you can. If it is almost time for your next dose, take only that dose. Do not take double or extra doses. What may interact with this medicine? Do not take this medicine with any of the following  medications:  certain medicines for fungal infections like fluconazole, itraconazole, ketoconazole, posaconazole, voriconazole  cisapride  dronedarone  linezolid  MAOIs like Carbex, Eldepryl, Marplan, Nardil, and Parnate  mesoridazine  methylene blue (injected into a vein)  pimozide  saquinavir  thioridazine This medicine may also interact with the following medications:  alcohol  antiviral medicines for HIV or AIDS  aspirin and aspirin-like medicines  barbiturates like phenobarbital  certain medicines for blood pressure, heart disease, irregular heart beat  certain medicines for anxiety, or psychotic disturbances  certain medicines for migraine headache like almotriptan, eletriptan, frovatriptan, naratriptan, rizatriptan, sumatriptan, zolmitriptan  certain medicines for seizures like carbamazepine and phenytoin  certain medicines for sleep  certain medicines that treat or prevent blood clots like dalteparin, enoxaparin, warfarin  digoxin  fentanyl  lithium  NSAIDS, medicines for pain and inflammation, like ibuprofen or naproxen  other medicines that prolong the QT interval (cause an abnormal heart rhythm) like dofetilide  rasagiline  supplements like St. John's wort, kava kava, valerian  tramadol  tryptophan This list may not describe all possible interactions. Give your health care provider a list of all the medicines, herbs, non-prescription drugs, or dietary supplements you use. Also tell them if you smoke, drink alcohol, or use illegal drugs. Some items may interact with your medicine. What should I watch for while using this medicine? Tell your doctor if your symptoms do not get better or if they get worse. Visit your doctor or health care professional for regular checks on your progress. Because it may take several  weeks to see the full effects of this medicine, it is important to continue your treatment as prescribed by your doctor. Patients and  their families should watch out for new or worsening thoughts of suicide or depression. Also watch out for sudden changes in feelings such as feeling anxious, agitated, panicky, irritable, hostile, aggressive, impulsive, severely restless, overly excited and hyperactive, or not being able to sleep. If this happens, especially at the beginning of treatment or after a change in dose, call your health care professional. Dennis Bast may get drowsy or dizzy. Do not drive, use machinery, or do anything that needs mental alertness until you know how this medicine affects you. Do not stand or sit up quickly, especially if you are an older patient. This reduces the risk of dizzy or fainting spells. Alcohol may interfere with the effect of this medicine. Avoid alcoholic drinks. This medicine may cause dry eyes and blurred vision. If you wear contact lenses you may feel some discomfort. Lubricating drops may help. See your eye doctor if the problem does not go away or is severe. Your mouth may get dry. Chewing sugarless gum, sucking hard candy and drinking plenty of water may help. Contact your doctor if the problem does not go away or is severe. What side effects may I notice from receiving this medicine? Side effects that you should report to your doctor or health care professional as soon as possible:  allergic reactions like skin rash, itching or hives, swelling of the face, lips, or tongue  elevated mood, decreased need for sleep, racing thoughts, impulsive behavior  confusion  fast, irregular heartbeat  feeling faint or lightheaded, falls  feeling agitated, angry, or irritable  loss of balance or coordination  painful or prolonged erections  restlessness, pacing, inability to keep still  suicidal thoughts or other mood changes  tremors  trouble sleeping  seizures  unusual bleeding or bruising Side effects that usually do not require medical attention (report to your doctor or health care  professional if they continue or are bothersome):  change in sex drive or performance  change in appetite or weight  constipation  headache  muscle aches or pains  nausea This list may not describe all possible side effects. Call your doctor for medical advice about side effects. You may report side effects to FDA at 1-800-FDA-1088. Where should I keep my medicine? Keep out of the reach of children. Store at room temperature between 15 and 30 degrees C (59 to 86 degrees F). Protect from light. Keep container tightly closed. Throw away any unused medicine after the expiration date. NOTE: This sheet is a summary. It may not cover all possible information. If you have questions about this medicine, talk to your doctor, pharmacist, or health care provider.  2020 Elsevier/Gold Standard (2018-09-30 11:46:46)

## 2019-10-26 NOTE — Progress Notes (Signed)
SLEEP MEDICINE CLINIC    Provider:  Larey Seat, MD  Primary Care Physician:  Midge Minium, MD [address] [place] [address]     Referring Provider: Midge Minium, [place] [address] [place],  [place] [address]          Chief Complaint according to patient   Patient presents with:    . New Patient (Initial Visit)           HISTORY OF PRESENT ILLNESS:  JEZABEL SHAHEED is a 74 y.o. year old White or Caucasian female patient seen here on 10/26/2019.  I have the pleasure of seeing ERIANA BOB today, a right -handed White or Caucasian female with a possible sleep disorder.  She has a  has a past medical history of Arthritis, Cataract, Fracture (10/2016), History of hiatal hernia, Hypothyroidism, Insomnia due to anxiety and fear, and Snoring (09/29/2014).Marland Kitchen  HPI:  GERIANNE VANESSEN is a 74 y.o. female  Is seen here as a revisit  from Dr. Birdie Riddle for chronic insomnia, treated with Seroquel .  Her last 4 appointments  were with Cecille Rubin, [facility].  She reports cyclic insomnia, and related to stressful situation. Her father was an alcoholic and air Customer service manager. The patient moved a lot with her family and has had lifelong fear of going to sleep, related to her experiences with an intoxicated father coming home and being belligerent, but she was never physically abused. Marland Kitchen  She was born on [place], her brother in [place] and her sister in [place]. She never lived in a place longer than 4 years. She lives in [place] since 1974.    Patient returns for followup after last visit 12-302019- Cervical spine disease, controlled with regular exercise and massages. She has a history of chronic insomnia and is currently on Seroquel doing well on that medication she continues to exercise daily. She has no new neurologic complaints.   ROS: insomnia.  Racing thoughts.  How likely are you to doze in the following situations: 0 = not likely, 1 = slight chance, 2 = moderate  chance, 3 = high chance  Sitting and Reading? Watching Television? Sitting inactive in a public place (theater or meeting)? Lying down in the afternoon when circumstances permit? Sitting and talking to someone? Sitting quietly after lunch without alcohol? In a car, while stopped for a few minutes in traffic? As a passenger in a car for an hour without a break?  Total = 0/ 24   GDS 1/ 15                        Sleep habits are as follows: The patient's dinner time is between 6 PM. The patient goes to bed at 9.30 PM and continues to sleep for 4 hours, wakes for one bathroom break.   The preferred sleep position is laterally- she sleeps with her dog in the same bed. sleeps with the support of 1 pillow. Dreams are reportedly frequent.  6.30 AM is the usual rise time. The patient wakes up spontaneously.   She reportsfeeling refreshed or restored in AM, she has horses and a dog that require attention.    Review of Systems: Out of a complete 14 system review, the patient complains of only the following symptoms, and all other reviewed systems are negative.:  fragmented sleep, Insomnia /    How likely are you to doze in the  following situations: 0 = not likely, 1 = slight chance, 2 = moderate chance, 3 = high chance   Sitting and Reading? Watching Television? Sitting inactive in a public place (theater or meeting)? As a passenger in a car for an hour without a break? Lying down in the afternoon when circumstances permit? Sitting and talking to someone? Sitting quietly after lunch without alcohol? In a car, while stopped for a few minutes in traffic?   Total = 0/ 24 points   FSS endorsed at 13/ 63 points.   Social History   Socioeconomic History  . Marital status: Married    Spouse name: Not on file  . Number of children: 0  . Years of education: Not on file  . Highest education level: Not on file  Occupational History  . Occupation: retired    Fish farm manager: NOT EMPLOYED     Comment: Education officer, museum   Tobacco Use  . Smoking status: Never Smoker  . Smokeless tobacco: Never Used  Substance and Sexual Activity  . Alcohol use: Yes    Alcohol/week: 5.0 standard drinks    Types: 1 Glasses of wine, 4 Standard drinks or equivalent per week    Comment: 1 drink nightly  . Drug use: No  . Sexual activity: Not Currently    Birth control/protection: Surgical    Comment: not much  Other Topics Concern  . Not on file  Social History Narrative   No children    2 dogs    3 horses    Husband patient of Dr. Birdie Riddle    Social Determinants of Health   Financial Resource Strain:   . Difficulty of Paying Living Expenses: Not on file  Food Insecurity:   . Worried About Charity fundraiser in the Last Year: Not on file  . Ran Out of Food in the Last Year: Not on file  Transportation Needs:   . Lack of Transportation (Medical): Not on file  . Lack of Transportation (Non-Medical): Not on file  Physical Activity:   . Days of Exercise per Week: Not on file  . Minutes of Exercise per Session: Not on file  Stress:   . Feeling of Stress : Not on file  Social Connections:   . Frequency of Communication with Friends and Family: Not on file  . Frequency of Social Gatherings with Friends and Family: Not on file  . Attends Religious Services: Not on file  . Active Member of Clubs or Organizations: Not on file  . Attends Archivist Meetings: Not on file  . Marital Status: Not on file    Family History  Problem Relation Age of Onset  . Ovarian cancer Mother 18  . Alcohol abuse Father   . Cirrhosis Father   . Stroke Sister 53  . Diabetes Paternal Grandmother   . Heart disease Neg Hx   . Hypertension Neg Hx   . Hyperlipidemia Neg Hx   . Colon cancer Neg Hx     Past Medical History:  Diagnosis Date  . Arthritis    Dr Estanislado Pandy  . Cataract   . Fracture 10/2016   left ankel  . History of hiatal hernia    found on endoscopy  . Hypothyroidism   . Insomnia  due to anxiety and fear   . Snoring 09/29/2014    Past Surgical History:  Procedure Laterality Date  . ABDOMINAL HYSTERECTOMY  1987   TAH,BSO, APPENDECTOMY  . APPENDECTOMY  1987   APPENDECTOMY AT T[facility],BSO  .  CATARACT EXTRACTION, BILATERAL Bilateral    20 plus years ago  . COLONOSCOPY      X 3; [facility]. All negative  . KNEE ARTHROSCOPY Bilateral   . METATARSAL OSTEOTOMY WITH BUNIONECTOMY Left 10/31/2017   Procedure: Left First Metatarsal Scarf, Modified McBride and Akin Osteotomies;  Surgeon: Wylene Simmer, MD;  Location: [facility];  Service: Orthopedics;  Laterality: Left;  . REPLACEMENT TOTAL KNEE Bilateral    X 1 each; Dr Percell Miller  . ROTATOR CUFF REPAIR Right 2010     Current Outpatient Medications on File Prior to Visit  Medication Sig Dispense Refill  . Calcium Carbonate-Vitamin D (CALCIUM + D PO) Take 1 tablet by mouth daily.     . cetirizine (ZYRTEC) 10 MG tablet Take 1 tablet (10 mg total) by mouth daily. 30 tablet 11  . Cholecalciferol (VITAMIN D PO) Take by mouth.    . Evening Primrose Oil CAPS Take 2 capsules by mouth daily.    Marland Kitchen GLUCOSAMINE PO Take by mouth.    . levothyroxine (SYNTHROID) 50 MCG tablet TAKE 1 TABLET(50 MCG) BY MOUTH DAILY 30 tablet 0  . MILK THISTLE PO Take by mouth.    . Multiple Vitamin (MULTIVITAMIN) capsule Take 1 capsule by mouth daily.    . Omega-3 Fatty Acids (OMEGA 3 PO) Take by mouth.    . QUEtiapine (SEROQUEL) 25 MG tablet Take 3 capsules at bedtime 270 tablet 3  . tiZANidine (ZANAFLEX) 4 MG tablet TAKE 1 TABLET(4 MG) BY MOUTH AT BEDTIME 90 tablet 3  . triamcinolone ointment (KENALOG) 0.1 % Apply 1 application topically 2 (two) times daily. 90 g 1  . triamterene-hydrochlorothiazide (DYAZIDE) 37.5-25 MG capsule Take 1 each (1 capsule total) by mouth every morning. 90 capsule 4   No current facility-administered medications on file prior to visit.    Allergies  Allergen Reactions  . Codeine Nausea And Vomiting  .  Formaldehyde Other (See Comments)    Allergy testing, unknown reaction    Physical exam:  Today's Vitals   10/26/19 1318  BP: (!) 152/83  Pulse: 80  Temp: (!) 97.4 F (36.3 C)  Weight: 137 lb (62.1 kg)  Height: 5\' 4"  (1.626 m)   Body mass index is 23.52 kg/m.   Wt Readings from Last 3 Encounters:  10/26/19 137 lb (62.1 kg)  10/14/19 133 lb 12.8 oz (60.7 kg)  03/23/19 132 lb (59.9 kg)     Ht Readings from Last 3 Encounters:  10/26/19 5\' 4"  (1.626 m)  10/14/19 5\' 4"  (1.626 m)  03/23/19 5\' 4"  (1.626 m)      General: The patient is awake, alert and appears not in acute distress. The patient is well groomed. Head: Normocephalic, atraumatic. Neck is supple. Mallampati 3- small mouth ,  neck circumference: 13 inches . Nasal airflow  patent.  Retrognathia is seen.  Cardiovascular:  Regular rate and cardiac rhythm by pulse,  without distended neck veins. Respiratory: Lungs are clear to auscultation.  Skin:  Without evidence of ankle edema, or rash. Trunk: The patient's posture is erect.   Neurologic exam : The patient is awake and alert, oriented to place and time.   Memory subjective described as intact.  Attention span & concentration ability appears normal.  Speech is fluent,  without  dysarthria, dysphonia or aphasia.  Mood and affect are appropriate.   Cranial nerves: no loss of smell or taste reported  Pupils are equal and briskly reactive to light. Funduscopic exam deferred.   Extraocular  movements in vertical and horizontal planes were intact and without nystagmus. No Diplopia. Visual fields by finger perimetry are intact.  Facial sensation intact.  Facial motor strength is symmetric and tongue and uvula move midline.  Neck ROM : rotation, tilt and flexion extension were normal for age and shoulder shrug was symmetrical.    Motor exam:  Symmetric bulk, tone and ROM.   Normal tone without cog wheeling, symmetric grip strength .   Sensory:   was normal.     Coordination: Rapid alternating movements in the fingers/hands were of normal speed.  The Finger-to-nose maneuver was intact without evidence of ataxia, dysmetria or tremor.   Gait and station: Patient could rise unassisted from a seated position, Deep tendon reflexes: in the  upper and lower extremities are symmetric and intact.  Babinski response was deferred.    After spending a total time of  20 minutes face to face and additional time for physical and neurologic examination, review of laboratory studies,  personal review of imaging studies, reports and results of other testing and review of referral information / records as far as provided in visit, I have established the following assessments:  1)  Chronic insomnia- Seroquel has been used with success for 6 years- I suggest trial of trazodone.  2) if trazodone fails, I will document the need for continued Seroquel.   3) black box warning discussed.    My Plan is to proceed with:  1) Flexaril and Trazodone.    I would like to thank Midge Minium, MD and Midge Minium, Md [address] [place],  [place] [address] for allowing me to meet with and to take care of this pleasant patient.   In short, ISABEAU MCCLARIN is presenting with chronic insomnia , which has been resilient to CBt, medication , etc in the past.    Electronically signed by: Larey Seat, MD 10/26/2019 1:32 PM  [facility] [place] certified by The [employer] of Sleep Medicine and Diplomate of the [employer] of Sleep Medicine. Board certified In Neurology through the [place], Fellow of the [employer] of Neurology. Medical Director of [employer].

## 2019-10-27 ENCOUNTER — Other Ambulatory Visit: Payer: Self-pay

## 2019-10-27 ENCOUNTER — Encounter: Payer: Self-pay | Admitting: Family Medicine

## 2019-10-27 ENCOUNTER — Ambulatory Visit (INDEPENDENT_AMBULATORY_CARE_PROVIDER_SITE_OTHER): Payer: PPO | Admitting: Family Medicine

## 2019-10-27 VITALS — BP 130/78 | HR 78 | Temp 97.9°F | Resp 16 | Ht 64.0 in | Wt 134.4 lb

## 2019-10-27 DIAGNOSIS — E039 Hypothyroidism, unspecified: Secondary | ICD-10-CM

## 2019-10-27 DIAGNOSIS — Z0000 Encounter for general adult medical examination without abnormal findings: Secondary | ICD-10-CM | POA: Diagnosis not present

## 2019-10-27 DIAGNOSIS — M8580 Other specified disorders of bone density and structure, unspecified site: Secondary | ICD-10-CM

## 2019-10-27 LAB — CBC WITH DIFFERENTIAL/PLATELET
Basophils Absolute: 0 10*3/uL (ref 0.0–0.1)
Basophils Relative: 0.6 % (ref 0.0–3.0)
Eosinophils Absolute: 0.2 10*3/uL (ref 0.0–0.7)
Eosinophils Relative: 2.9 % (ref 0.0–5.0)
HCT: 43.3 % (ref 36.0–46.0)
Hemoglobin: 14.7 g/dL (ref 12.0–15.0)
Lymphocytes Relative: 22.9 % (ref 12.0–46.0)
Lymphs Abs: 1.4 10*3/uL (ref 0.7–4.0)
MCHC: 33.9 g/dL (ref 30.0–36.0)
MCV: 99.7 fl (ref 78.0–100.0)
Monocytes Absolute: 0.5 10*3/uL (ref 0.1–1.0)
Monocytes Relative: 9.2 % (ref 3.0–12.0)
Neutro Abs: 3.8 10*3/uL (ref 1.4–7.7)
Neutrophils Relative %: 64.4 % (ref 43.0–77.0)
Platelets: 203 10*3/uL (ref 150.0–400.0)
RBC: 4.34 Mil/uL (ref 3.87–5.11)
RDW: 13.8 % (ref 11.5–15.5)
WBC: 5.9 10*3/uL (ref 4.0–10.5)

## 2019-10-27 LAB — HEPATIC FUNCTION PANEL
ALT: 36 U/L — ABNORMAL HIGH (ref 0–35)
AST: 17 U/L (ref 0–37)
Albumin: 4.7 g/dL (ref 3.5–5.2)
Alkaline Phosphatase: 50 U/L (ref 39–117)
Bilirubin, Direct: 0.2 mg/dL (ref 0.0–0.3)
Total Bilirubin: 0.8 mg/dL (ref 0.2–1.2)
Total Protein: 7.2 g/dL (ref 6.0–8.3)

## 2019-10-27 LAB — BASIC METABOLIC PANEL
BUN: 15 mg/dL (ref 6–23)
CO2: 28 mEq/L (ref 19–32)
Calcium: 10.2 mg/dL (ref 8.4–10.5)
Chloride: 97 mEq/L (ref 96–112)
Creatinine, Ser: 0.71 mg/dL (ref 0.40–1.20)
GFR: 80.59 mL/min (ref >60.00)
Glucose, Bld: 105 mg/dL — ABNORMAL HIGH (ref 70–99)
Potassium: 3.8 mEq/L (ref 3.5–5.1)
Sodium: 137 mEq/L (ref 135–145)

## 2019-10-27 LAB — LIPID PANEL
Cholesterol: 265 mg/dL — ABNORMAL HIGH (ref 0–200)
HDL: 108.1 mg/dL (ref >39.00)
LDL Cholesterol: 131 mg/dL — ABNORMAL HIGH (ref 0–99)
NonHDL: 157.35
Total CHOL/HDL Ratio: 2
Triglycerides: 132 mg/dL (ref 0.0–149.0)
VLDL: 26.4 mg/dL (ref 0.0–40.0)

## 2019-10-27 LAB — VITAMIN D 25 HYDROXY (VIT D DEFICIENCY, FRACTURES): VITD: 32.47 ng/mL (ref 30.00–100.00)

## 2019-10-27 LAB — TSH: TSH: 4.09 u[IU]/mL (ref 0.35–4.50)

## 2019-10-27 NOTE — Assessment & Plan Note (Signed)
Pt's PE WNL.  UTD on immunizations, colonoscopy, mammo, DEXA.  Check labs.  Anticipatory guidance provided.

## 2019-10-27 NOTE — Assessment & Plan Note (Signed)
Currently asymptomatic.  Check labs.  Adjust meds prn

## 2019-10-27 NOTE — Progress Notes (Signed)
   Subjective:    Patient ID: [name], female    DOB: Sep 04, 1946, 74 y.o.   MRN: [id number]  HPI CPE- UTD on colonoscopy, mammo, DEXA, immunizations.   Review of Systems Patient reports no vision/ hearing changes, adenopathy,fever, weight change,  persistant/recurrent hoarseness , swallowing issues, chest pain, palpitations, edema, persistant/recurrent cough, hemoptysis, dyspnea (rest/exertional/paroxysmal nocturnal), gastrointestinal bleeding (melena, rectal bleeding), abdominal pain, significant heartburn, bowel changes, GU symptoms (dysuria, hematuria, incontinence), Gyn symptoms (abnormal  bleeding, pain),  syncope, focal weakness, memory loss, numbness & tingling, skin/hair/nail changes, abnormal bruising or bleeding, anxiety, or depression.   This visit occurred during the SARS-CoV-2 public health emergency.  Safety protocols were in place, including screening questions prior to the visit, additional usage of staff PPE, and extensive cleaning of exam room while observing appropriate contact time as indicated for disinfecting solutions.       Objective:   Physical Exam General Appearance:    Alert, cooperative, no distress, appears stated age  Head:    Normocephalic, without obvious abnormality, atraumatic  Eyes:    PERRL, conjunctiva/corneas clear, EOM's intact, fundi    benign, both eyes  Ears:    Normal TM's and external ear canals, both ears  Nose:   Deferred due to COVID  Throat:   Neck:   Supple, symmetrical, trachea midline, no adenopathy;    Thyroid: no enlargement/tenderness/nodules  Back:     Symmetric, no curvature, ROM normal, no CVA tenderness  Lungs:     Clear to auscultation bilaterally, respirations unlabored  Chest Wall:    No tenderness or deformity   Heart:    Regular rate and rhythm, S1 and S2 normal, no murmur, rub   or gallop  Breast Exam:    Deferred to GYN  Abdomen:     Soft, non-tender, bowel sounds active all four quadrants,    no masses, no  organomegaly  Genitalia:    Deferred to GYN  Rectal:    Extremities:   Extremities normal, atraumatic, no cyanosis or edema  Pulses:   2+ and symmetric all extremities  Skin:   Skin color, texture, turgor normal, no rashes or lesions  Lymph nodes:   Cervical, supraclavicular, and axillary nodes normal  Neurologic:   CNII-XII intact, normal strength, sensation and reflexes    throughout          Assessment & Plan:

## 2019-10-27 NOTE — Patient Instructions (Signed)
Follow up in 1 year or as needed We'll notify you of your lab results and make any changes if needed Keep up the good work on healthy diet and regular exercise- you look great! Call with any questions or concerns Stay Safe!  Stay Healthy! Happy New Year!! 

## 2019-10-27 NOTE — Assessment & Plan Note (Signed)
Check Vit D and replete prn. 

## 2019-10-29 ENCOUNTER — Other Ambulatory Visit: Payer: Self-pay | Admitting: Neurology

## 2019-10-29 ENCOUNTER — Encounter: Payer: Self-pay | Admitting: General Practice

## 2019-10-29 ENCOUNTER — Encounter: Payer: Self-pay | Admitting: Neurology

## 2019-10-29 ENCOUNTER — Telehealth: Payer: Self-pay | Admitting: Neurology

## 2019-10-29 MED ORDER — QUETIAPINE FUMARATE 25 MG PO TABS: ORAL_TABLET | ORAL | 3 refills | Status: DC

## 2019-10-29 NOTE — Telephone Encounter (Signed)
Spoke with Dr Brett Fairy and as they discussed in the office visit she was glad the patient tried it but we will dc and switch the patient back to seroquel as she was previously taking it. I have resent the script for the seroquel and dc the trazodone in the system.

## 2019-10-29 NOTE — Telephone Encounter (Signed)
Called the patient back to clarify because in reading Dr Dohmeier's note she was just started on the trazodone. Patient states that she started to take 1 tab and had to take a 2nd tablet so a total of 100 mg at bedtime and it is not beneficial in helping her sleep. She is asking if ok to go back to the seroquel 25 mg -3 tablets (75mg ) at bedtime an dc the trazodone as previously discussed. Advised the patient I will make Dr Dohmeier aware that the medication is not working for her and ask if she Is ok with going back to the change. If so patient is asking that we send updated script to the pharmacy for her.

## 2019-10-29 NOTE — Telephone Encounter (Signed)
Pt is asking if she can go back on the medication before the traZODone (DESYREL) 50 MG tablet.  Please call

## 2019-10-30 DIAGNOSIS — M1991 Primary osteoarthritis, unspecified site: Secondary | ICD-10-CM | POA: Diagnosis not present

## 2019-10-30 DIAGNOSIS — M25522 Pain in left elbow: Secondary | ICD-10-CM | POA: Diagnosis not present

## 2019-11-05 DIAGNOSIS — M1991 Primary osteoarthritis, unspecified site: Secondary | ICD-10-CM | POA: Diagnosis not present

## 2019-11-05 DIAGNOSIS — M25522 Pain in left elbow: Secondary | ICD-10-CM | POA: Diagnosis not present

## 2019-11-11 ENCOUNTER — Ambulatory Visit: Payer: PPO | Attending: Internal Medicine

## 2019-11-11 DIAGNOSIS — Z23 Encounter for immunization: Secondary | ICD-10-CM

## 2019-11-11 NOTE — Progress Notes (Signed)
   Covid-19 Vaccination Clinic  Name:  [name]    MRN: [id number] DOB: March 24, 1946  11/11/2019  [name] was observed post Covid-19 immunization for 15 minutes without incidence. She was provided with Vaccine Information Sheet and instruction to access the V-Safe system.   [name] was instructed to call 911 with any severe reactions post vaccine: Marland Kitchen Difficulty breathing  . Swelling of your face and throat  . A fast heartbeat  . A bad rash all over your body  . Dizziness and weakness    Immunizations Administered    Name Date Dose VIS Date Route   Pfizer COVID-19 Vaccine 11/11/2019  5:23 PM 0.3 mL 10/02/2019 Intramuscular   Manufacturer: [place]   Lot: [id number]   [place]: [id number]

## 2019-12-02 ENCOUNTER — Ambulatory Visit: Payer: PPO | Attending: Internal Medicine

## 2019-12-02 DIAGNOSIS — Z23 Encounter for immunization: Secondary | ICD-10-CM | POA: Insufficient documentation

## 2019-12-02 NOTE — Progress Notes (Signed)
   Covid-19 Vaccination Clinic  Name:  [name]    MRN: [id number] DOB: 08/21/1946  12/02/2019  [name] was observed post Covid-19 immunization for 15 minutes without incidence. She was provided with Vaccine Information Sheet and instruction to access the V-Safe system.   [name] was instructed to call 911 with any severe reactions post vaccine: Marland Kitchen Difficulty breathing  . Swelling of your face and throat  . A fast heartbeat  . A bad rash all over your body  . Dizziness and weakness    Immunizations Administered    Name Date Dose VIS Date Route   Pfizer COVID-19 Vaccine 12/02/2019 12:14 PM 0.3 mL 10/02/2019 Intramuscular   Manufacturer: [place]   Lot: [id number]   [place]: [id number]

## 2019-12-03 ENCOUNTER — Telehealth: Payer: Self-pay | Admitting: Family Medicine

## 2019-12-03 MED ORDER — LEVOTHYROXINE SODIUM 50 MCG PO TABS: ORAL_TABLET | ORAL | 1 refills | Status: DC

## 2019-12-03 NOTE — Telephone Encounter (Signed)
Pt husband Legrand Como) called, Andalyn needs a refill of her Levothyroxine, 90 day supply to [facility] in [place].  Pt states [facility] has sent a fax requesting this on 2/5.

## 2019-12-03 NOTE — Telephone Encounter (Signed)
Medication filled to pharmacy as requested.  I had not received a refill request for this previously.

## 2019-12-21 DIAGNOSIS — M25522 Pain in left elbow: Secondary | ICD-10-CM | POA: Diagnosis not present

## 2019-12-21 DIAGNOSIS — M170 Bilateral primary osteoarthritis of knee: Secondary | ICD-10-CM | POA: Diagnosis not present

## 2019-12-23 ENCOUNTER — Telehealth: Payer: Self-pay | Admitting: Neurology

## 2019-12-23 ENCOUNTER — Encounter: Payer: Self-pay | Admitting: Neurology

## 2019-12-23 NOTE — Telephone Encounter (Signed)
Pt called and would like to discuss questions she has on her medication Trazodone. Please advise.

## 2019-12-23 NOTE — Telephone Encounter (Signed)
Called the pt back and she stated she was confused. I had her explain why. Her pharmacy had been telling her that she is suppose to take the Trazodone with her seroquel. Advised that patient per out last conversation that was not the case. She stated that since the trazodone didn't help she preferred to go back to seroquel and so the patient and Dr Dohmeier agreed to go back to that. The pharmacy continued to tell the patient she was wrong.. but I have sent the patient message  through mychart showing where the script for seroquel was sent with a note to pharmacy stating the patient must stop.  Pt verbalized understanding and will follow up with pharmacy. Patient appreciative for the call back

## 2020-01-04 ENCOUNTER — Other Ambulatory Visit: Payer: Self-pay | Admitting: Neurology

## 2020-01-04 MED ORDER — TIZANIDINE HCL 4 MG PO TABS: ORAL_TABLET | ORAL | 3 refills | Status: DC

## 2020-01-20 DIAGNOSIS — H43813 Vitreous degeneration, bilateral: Secondary | ICD-10-CM | POA: Diagnosis not present

## 2020-01-22 ENCOUNTER — Telehealth: Payer: Self-pay | Admitting: Family Medicine

## 2020-01-22 NOTE — Progress Notes (Signed)
  Chronic Care Management   Note  01/22/2020 Name: [name] MRN: [id number] DOB: June 13, 1946  [name] is a 74 y.o. year old female who is a primary care patient of Birdie Riddle, Aundra Millet, MD. I reached out to Lockie Pares by phone today in response to a referral sent by [name]'s PCP, Midge Minium, MD.   [name] was given information about Chronic Care Management services today including:  1. CCM service includes personalized support from designated clinical staff supervised by her physician, including individualized plan of care and coordination with other care providers 2. 24/7 contact phone numbers for assistance for urgent and routine care needs. 3. Service will only be billed when office clinical staff spend 20 minutes or more in a month to coordinate care. 4. Only one practitioner may furnish and bill the service in a calendar month. 5. The patient may stop CCM services at any time (effective at the end of the month) by phone call to the office staff.   Patient agreed to services and verbal consent obtained.   Follow up plan:   Earney Hamburg Upstream Scheduler

## 2020-01-26 ENCOUNTER — Other Ambulatory Visit: Payer: Self-pay | Admitting: General Practice

## 2020-01-26 DIAGNOSIS — E785 Hyperlipidemia, unspecified: Secondary | ICD-10-CM

## 2020-01-26 DIAGNOSIS — E039 Hypothyroidism, unspecified: Secondary | ICD-10-CM

## 2020-01-27 ENCOUNTER — Telehealth: Payer: PPO

## 2020-02-02 ENCOUNTER — Telehealth: Payer: Self-pay | Admitting: Family Medicine

## 2020-02-02 NOTE — Progress Notes (Signed)
°  Chronic Care Management   Outreach Note  02/02/2020 Name: [name] MRN: [id number] DOB: 10-22-1946  Referred by: Midge Minium, MD Reason for referral : No chief complaint on file.   An unsuccessful telephone outreach was attempted today. The patient was referred to the pharmacist for assistance with care management and care coordination.   Follow Up Plan:   Earney Hamburg Upstream Scheduler

## 2020-02-26 ENCOUNTER — Other Ambulatory Visit: Payer: Self-pay

## 2020-02-26 ENCOUNTER — Ambulatory Visit (INDEPENDENT_AMBULATORY_CARE_PROVIDER_SITE_OTHER): Payer: PPO | Admitting: Family Medicine

## 2020-02-26 ENCOUNTER — Encounter: Payer: Self-pay | Admitting: Family Medicine

## 2020-02-26 VITALS — BP 121/81 | HR 80 | Temp 97.7°F | Resp 16 | Ht 64.0 in | Wt 131.4 lb

## 2020-02-26 DIAGNOSIS — I8392 Asymptomatic varicose veins of left lower extremity: Secondary | ICD-10-CM

## 2020-02-26 NOTE — Patient Instructions (Addendum)
Follow up as needed or as scheduled Thankfully this is benign and a normal part of birthdays Elevation will help w/ swelling Knee high stockings or compression socks can also help if needed Call with any questions or concerns Have a great weekend!!

## 2020-02-26 NOTE — Progress Notes (Signed)
   Subjective:    Patient ID: [name], female    DOB: September 23, 1946, 74 y.o.   MRN: [id number]  HPI Foot swelling- vein on dorsum of L foot 'stays swollen'.  First noticed 'a couple of weeks ago'.  No TTP.  Foot/ankle do not swell.  No known injury.  No redness or warmth to the area.   Review of Systems For ROS see HPI   This visit occurred during the SARS-CoV-2 public health emergency.  Safety protocols were in place, including screening questions prior to the visit, additional usage of staff PPE, and extensive cleaning of exam room while observing appropriate contact time as indicated for disinfecting solutions.       Objective:   Physical Exam Vitals reviewed.  Constitutional:      General: She is not in acute distress.    Appearance: Normal appearance. She is not ill-appearing.  HENT:     Head: Normocephalic and atraumatic.  Cardiovascular:     Pulses: Normal pulses.     Comments: Distended but soft and easily compressible vein on dorsum of L foot, no TTP Skin:    General: Skin is warm and dry.  Neurological:     General: No focal deficit present.     Mental Status: She is alert and oriented to person, place, and time.           Assessment & Plan:  Varicose vein- new.  Reviewed dx w/ pt and discussed benign finding.  No evidence of superficial or deep clot and no TTP, ulceration, or other complication.  Discussed elevation and compression socks to help w/ swelling.  Pt expressed understanding and is in agreement w/ plan.

## 2020-03-18 ENCOUNTER — Encounter: Payer: Self-pay | Admitting: Family Medicine

## 2020-03-18 DIAGNOSIS — Z1231 Encounter for screening mammogram for malignant neoplasm of breast: Secondary | ICD-10-CM | POA: Diagnosis not present

## 2020-03-27 NOTE — Progress Notes (Signed)
Chronic Care Management Pharmacy  Name: [name]  MRN: [id number] DOB: 1946/05/01  Chief Complaint/ HPI  [name],  74 y.o. , female presents for their Initial CCM visit with the clinical pharmacist via telephone due to COVID-19 Pandemic.  PCP : Midge Minium, MD  Their chronic conditions include:  Encounter Diagnoses  Name Primary?  . Hypothyroidism, unspecified type Yes  . Osteopenia, unspecified location   . Insomnia, persistent    Patient Active Problem List   Diagnosis Date Noted  . Hypothyroid 01/20/2018  . Physical exam 10/23/2017  . Hypercalciuria 10/23/2017  . Snoring 09/29/2014  . Nonspecific elevation of levels of transaminase or lactic acid dehydrogenase (LDH) 02/09/2014  . Insomnia, persistent 09/23/2013  . Insomnia due to anxiety and fear   . Chest pain 06/17/2013  . GERD (gastroesophageal reflux disease) 05/04/2013  . Displacement of cervical intervertebral disc without myelopathy 03/12/2013  . Osteopenia   . Endometriosis    Past Surgical History:  Procedure Laterality Date  . ABDOMINAL HYSTERECTOMY  1987   [facility], APPENDECTOMY  . APPENDECTOMY  1987   APPENDECTOMY AT [facility]O  . CATARACT EXTRACTION, BILATERAL Bilateral    20 plus years ago  . COLONOSCOPY      X 3; [facility]. All negative  . KNEE ARTHROSCOPY Bilateral   . METATARSAL OSTEOTOMY WITH BUNIONECTOMY Left 10/31/2017   Procedure: Left First Metatarsal Scarf, Modified McBride and Akin Osteotomies;  Surgeon: Wylene Simmer, MD;  Location: [facility];  Service: Orthopedics;  Laterality: Left;  . REPLACEMENT TOTAL KNEE Bilateral    X 1 each; Dr Percell Miller  . ROTATOR CUFF REPAIR Right 2010    Social History   Socioeconomic History  . Marital status: Married    Spouse name: Not on file  . Number of children: 0  . Years of education: Not on file  . Highest education level: Not on file  Occupational History  . Occupation: retired    Fish farm manager: NOT EMPLOYED     Comment: Education officer, [employer]   Tobacco Use  . Smoking status: Never Smoker  . Smokeless tobacco: Never Used  Substance and Sexual Activity  . Alcohol use: Yes    Alcohol/week: 5.0 standard drinks    Types: 1 Glasses of wine, 4 Standard drinks or equivalent per week    Comment: 1 drink nightly  . Drug use: No  . Sexual activity: Not Currently    Birth control/protection: Surgical    Comment: not much  Other Topics Concern  . Not on file  Social History Narrative   No children    2 dogs    3 horses    Husband patient of Dr. Birdie Riddle    Social Determinants of Health   Financial Resource Strain:   . Difficulty of Paying Living Expenses:   Food Insecurity: No Food Insecurity  . Worried About Charity fundraiser in the Last Year: Never true  . Ran Out of Food in the Last Year: Never true  Transportation Needs: No Transportation Needs  . Lack of Transportation (Medical): No  . Lack of Transportation (Non-Medical): No  Physical Activity:   . Days of Exercise per Week:   . Minutes of Exercise per Session:   Stress:   . Feeling of Stress :   Social Connections:   . Frequency of Communication with Friends and Family:   . Frequency of Social Gatherings with Friends and Family:   . Attends Religious Services:   . Active Member  of Clubs or Organizations:   . Attends Archivist Meetings:   Marland Kitchen Marital Status:    Family History  Problem Relation Age of Onset  . Ovarian cancer Mother 82  . Alcohol abuse Father   . Cirrhosis Father   . Stroke Sister 27  . Diabetes Paternal Grandmother   . Heart disease Neg Hx   . Hypertension Neg Hx   . Hyperlipidemia Neg Hx   . Colon cancer Neg Hx    Allergies  Allergen Reactions  . Codeine Nausea And Vomiting  . Formaldehyde Other (See Comments)    Allergy testing, unknown reaction   Patient Care Team    Relationship Specialty Notifications Start End  Midge Minium, MD PCP - General Family Medicine  01/30/16   Huel Cote, NP  Nurse Practitioner Obstetrics and Gynecology  07/11/16   Dennie Bible, NP Nurse Practitioner Family Medicine  07/11/16   Dohmeier, Asencion Partridge, MD Consulting Physician Neurology  07/17/17   Gale Journey, Gwen Her, MD Referring Physician Endocrinology  07/17/17   Kelton Pillar, MD Referring Physician Orthopedic Surgery  07/17/17   Harriett Sine, MD Consulting Physician Dermatology  10/14/19   [place], Heather, [facility] Physician Optometry  10/14/19   Madelin Rear, [facility] Pharmacist Pharmacist  01/22/20    Comment: phone number [phone]     Outpatient Encounter Medications as of 03/28/2020  Medication Sig  . Biotin 5 MG CAPS Take 5 mg by mouth daily.  . Calcium Carbonate-Vitamin D (CALCIUM + D PO) Take 1 tablet by mouth daily.   . Cholecalciferol (VITAMIN D PO) Take by mouth.  . Evening Primrose Oil CAPS Take 2 capsules by mouth daily.  Marland Kitchen GLUCOSAMINE PO Take 3,000 mg by mouth.   . levothyroxine (SYNTHROID) 50 MCG tablet TAKE 1 TABLET(50 MCG) BY MOUTH DAILY  . MILK THISTLE PO Take by mouth.  . Multiple Vitamin (MULTIVITAMIN) capsule Take 1 capsule by mouth daily.  . Omega-3 Fatty Acids (OMEGA 3 PO) Take 3,600 mg by mouth.   . QUEtiapine (SEROQUEL) 25 MG tablet Take 3 capsules at bedtime  . tiZANidine (ZANAFLEX) 4 MG tablet TAKE 1 TABLET(4 MG) BY MOUTH AT BEDTIME  . triamterene-hydrochlorothiazide (DYAZIDE) 37.5-25 MG capsule Take 1 each (1 capsule total) by mouth every morning.  . cetirizine (ZYRTEC) 10 MG tablet Take 1 tablet (10 mg total) by mouth daily. (Patient not taking: Reported on 03/28/2020)   No facility-administered encounter medications on file as of 03/28/2020.   Current Diagnosis/Assessment:  Goals Addressed            This Visit's Progress   . PharmD Care Plan       CARE PLAN ENTRY  Current Barriers:  . Chronic Disease Management support, education, and care coordination needs related to Hypothyroidism, Osteopopenia, and insomnia   Insomnia . Pharmacist Clinical  Goal(s) o Over the next 180 days, patient will work with PharmD and providers to minimize symptoms of insomnia . Current regimen:  o Quetiapine 75 mg every night at bedtime . Interventions: o Continue current management . Patient self care activities - Over the next 180 days, patient will: o Continue current management Osteopenia . Pharmacist Clinical Goal(s) o Over the next 180 days, patient will work with PharmD and providers to maintain bone health . Current regimen:  o Calcium Carbonate-Vitamin D (CALCIUM + D PO) once daily  . Interventions: o Continue current management . Patient self care activities - Over the next 180 days, patient will: o Continue current  management  Hypothyroidism . Pharmacist Clinical Goal(s) o Over the next 180 days, patient will work with PharmD and providers to maintain goal TSH levels . Current regimen:  o Levothyroxine 50 mcg daily  . Interventions: o Continue current management . Patient self care activities - Over the next 180 days, patient will: o Continue current management  Medication management . Pharmacist Clinical Goal(s): o Over the next 180 days, patient will work with PharmD and providers to maintain optimal medication adherence . Current pharmacy: [facility]  . Interventions o Comprehensive medication review performed. o Continue current medication management strategy . Patient self care activities - Over the next 180 days, patient will: o Focus on medication adherence - Continue current management o Take medications as prescribed o Report any questions or concerns to PharmD and/or provider(s)  Initial goal documentation.      Current Diagnosis/Assessment: Goals Addressed            This Visit's Progress   . PharmD Care Plan       CARE PLAN ENTRY  Current Barriers:  . Chronic Disease Management support, education, and care coordination needs related to Hypothyroidism, Osteopopenia, and insomnia    Insomnia . Pharmacist Clinical Goal(s) o Over the next 180 days, patient will work with PharmD and providers to minimize symptoms of insomnia . Current regimen:  o Quetiapine 75 mg every night at bedtime . Interventions: o Continue current management . Patient self care activities - Over the next 180 days, patient will: o Continue current management Osteopenia . Pharmacist Clinical Goal(s) o Over the next 180 days, patient will work with PharmD and providers to maintain bone health . Current regimen:  o Calcium Carbonate-Vitamin D (CALCIUM + D PO) once daily  . Interventions: o Continue current management . Patient self care activities - Over the next 180 days, patient will: o Continue current management  Hypothyroidism . Pharmacist Clinical Goal(s) o Over the next 180 days, patient will work with PharmD and providers to maintain goal TSH levels . Current regimen:  o Levothyroxine 50 mcg daily  . Interventions: o Continue current management . Patient self care activities - Over the next 180 days, patient will: o Continue current management  Medication management . Pharmacist Clinical Goal(s): o Over the next 180 days, patient will work with PharmD and providers to maintain optimal medication adherence . Current pharmacy: [facility]  . Interventions o Comprehensive medication review performed. o Continue current medication management strategy . Patient self care activities - Over the next 180 days, patient will: o Focus on medication adherence - Continue current management o Take medications as prescribed o Report any questions or concerns to PharmD and/or provider(s)  Initial goal documentation.       hypercalciuria   Taking dyazide due to excessive calcium in urine. Denies any issues with blood pressure or chest pain, dizziness, SOB.  BP Readings from Last 3 Encounters:  02/26/20 121/81  10/27/19 130/78  10/26/19 (!) 152/83   Lab Results  Component Value Date    CALCIUM 10.2 10/27/2019   CALCIUM 10.2 10/24/2018   CALCIUM 10.4 10/23/2017   Patient checks BP at home infrequently. BP has been well controlled.   Patient is currently controlled on the following medications:  . Dyazide 37.5-25 mg daily   Plan  Continue current medications    Hypothyroidism   Lab Results  Component Value Date/Time   TSH 4.09 10/27/2019 09:47 AM   TSH 3.23 10/24/2018 08:37 AM   TSH 2.95 11/26/2017 09:09 AM  We discussed proper administration of levothyroxine. Patient is currently controlled on the following medications:  . Levothyroxine 50 mcg daily  Plan  Continue current medications  Osteopenia   Last DEXA Scan: 2018. Osteopenia.   VITD  Date Value Ref Range Status  10/27/2019 32.47 30.00 - 100.00 ng/mL Final    Patient is not a candidate for pharmacologic treatment  We discussed:  Recommend [phone] units of vitamin D daily. Recommend 1200 mg of calcium daily from dietary and supplemental sources. Recommend weight-bearing and muscle strengthening exercises for building and maintaining bone density.  Patient is currently controlled on the following medications:   Calcium Carbonate-Vitamin D (CALCIUM + D PO) daily  Plan  Continue current medications   Insomnia    Recently tried trazodone but reports no improvement. Routinely wakes in the middle of the night since child. Reports ongoing benefit with current regimen. Denies any side effects at this time.   Patient is currently controlled on the following medications:   Quetiapine 75 mg every night at bedtime   Plan  Continue current medications.   Vaccines   Reviewed and discussed patient's vaccination history.  Up to date on recommended vaccines.   Immunization History  Administered Date(s) Administered  . Influenza, High Dose Seasonal PF 07/17/2017, 07/13/2018, 06/10/2019  . Influenza,inj,Quad PF,6+ Mos 07/11/2016  . Influenza-Unspecified 08/12/2014, 07/19/2015  . PFIZER  SARS-COV-2 Vaccination 11/11/2019, 12/02/2019  . Pneumococcal Conjugate-13 08/12/2014  . Pneumococcal Polysaccharide-23 09/10/2012, 10/31/2019  . Tdap 08/12/2014  . Zoster 01/18/2012  . Zoster Recombinat (Shingrix) 09/02/2017, 09/27/2019   Plan  No recommendations   Medication Management   Receives prescription medications from:  [facility], [place] - [address] [place] [address] Phone: [phone] Fax: [phone]  Plan  Continue current medication management strategy.  Follow up: 6 month phone visit. ______________ Visit Information SDOH (Social Determinants of Health) assessments performed: Yes.  Ms. Hipps was given information about Chronic Care Management services today including:  1. CCM service includes personalized support from designated clinical staff supervised by her physician, including individualized plan of care and coordination with other care providers 2. 24/7 contact phone numbers for assistance for urgent and routine care needs. 3. Standard insurance, coinsurance, copays and deductibles apply for chronic care management only during months in which we provide at least 20 minutes of these services. Most insurances cover these services at 100%, however patients may be responsible for any copay, coinsurance and/or deductible if applicable. This service may help you avoid the need for more expensive face-to-face services. 4. Only one practitioner may furnish and bill the service in a calendar month. 5. The patient may stop CCM services at any time (effective at the end of the month) by phone call to the office staff.  Patient agreed to services and verbal consent obtained.   Madelin Rear, Pharm.D., BCGP Clinical Pharmacist [facility] at [facility] [phone]

## 2020-03-28 ENCOUNTER — Other Ambulatory Visit: Payer: Self-pay

## 2020-03-28 ENCOUNTER — Ambulatory Visit: Payer: PPO

## 2020-03-28 DIAGNOSIS — G4700 Insomnia, unspecified: Secondary | ICD-10-CM

## 2020-03-28 DIAGNOSIS — E039 Hypothyroidism, unspecified: Secondary | ICD-10-CM

## 2020-03-28 DIAGNOSIS — M8580 Other specified disorders of bone density and structure, unspecified site: Secondary | ICD-10-CM

## 2020-03-28 NOTE — Patient Instructions (Addendum)
Please call me at [phone] (direct line) with any questions - thank you!  - Edyth Gunnels., Clinical Pharmacist  Goals Addressed            This Visit's Progress   . PharmD Care Plan       CARE PLAN ENTRY  Current Barriers:  . Chronic Disease Management support, education, and care coordination needs related to Hypothyroidism, Osteopopenia, and insomnia   Insomnia . Pharmacist Clinical Goal(s) o Over the next 180 days, patient will work with PharmD and providers to minimize symptoms of insomnia . Current regimen:  o Quetiapine 75 mg every night at bedtime . Interventions: o Continue current management . Patient self care activities - Over the next 180 days, patient will: o Continue current management Osteopenia . Pharmacist Clinical Goal(s) o Over the next 180 days, patient will work with PharmD and providers to maintain bone health . Current regimen:  o Calcium Carbonate-Vitamin D (CALCIUM + D PO) once daily  . Interventions: o Continue current management . Patient self care activities - Over the next 180 days, patient will: o Continue current management  Hypothyroidism . Pharmacist Clinical Goal(s) o Over the next 180 days, patient will work with PharmD and providers to maintain goal TSH levels . Current regimen:  o Levothyroxine 50 mcg daily  . Interventions: o Continue current management . Patient self care activities - Over the next 180 days, patient will: o Continue current management  Medication management . Pharmacist Clinical Goal(s): o Over the next 180 days, patient will work with PharmD and providers to maintain optimal medication adherence . Current pharmacy: [facility]  . Interventions o Comprehensive medication review performed. o Continue current medication management strategy . Patient self care activities - Over the next 180 days, patient will: o Focus on medication adherence - Continue current management o Take medications as prescribed o Report  any questions or concerns to PharmD and/or provider(s)  Initial goal documentation.      Ms. Raider was given information about Chronic Care Management services today including:  1. CCM service includes personalized support from designated clinical staff supervised by her physician, including individualized plan of care and coordination with other care providers 2. 24/7 contact phone numbers for assistance for urgent and routine care needs. 3. Standard insurance, coinsurance, copays and deductibles apply for chronic care management only during months in which we provide at least 20 minutes of these services. Most insurances cover these services at 100%, however patients may be responsible for any copay, coinsurance and/or deductible if applicable. This service may help you avoid the need for more expensive face-to-face services. 4. Only one practitioner may furnish and bill the service in a calendar month. 5. The patient may stop CCM services at any time (effective at the end of the month) by phone call to the office staff.  Patient agreed to services and verbal consent obtained.   The patient verbalized understanding of instructions provided today and agreed to receive a mailed copy of patient instruction and/or educational materials. Telephone follow up appointment with pharmacy team member scheduled for: See next appointment with "Care Management Staff" under "What's Next" below.   Thank you!  Madelin Rear, Pharm.D., BCGP Clinical Pharmacist [facility] at [facility] [phone]  Insomnia Insomnia is a sleep disorder that makes it difficult to fall asleep or stay asleep. Insomnia can cause fatigue, low energy, difficulty concentrating, mood swings, and poor performance at work or school. There are three different ways to classify insomnia:  Difficulty falling asleep.  Difficulty staying asleep.  Waking up too early in the morning. Any type of insomnia can be  long-term (chronic) or short-term (acute). Both are common. Short-term insomnia usually lasts for three months or less. Chronic insomnia occurs at least three times a week for longer than three months. What are the causes? Insomnia may be caused by another condition, situation, or substance, such as:  Anxiety.  Certain medicines.  Gastroesophageal reflux disease (GERD) or other gastrointestinal conditions.  Asthma or other breathing conditions.  Restless legs syndrome, sleep apnea, or other sleep disorders.  Chronic pain.  Menopause.  Stroke.  Abuse of alcohol, tobacco, or illegal drugs.  Mental health conditions, such as depression.  Caffeine.  Neurological disorders, such as Alzheimer's disease.  An overactive thyroid (hyperthyroidism). Sometimes, the cause of insomnia may not be known. What increases the risk? Risk factors for insomnia include:  Gender. Women are affected more often than men.  Age. Insomnia is more common as you get older.  Stress.  Lack of exercise.  Irregular work schedule or working night shifts.  Traveling between different time zones.  Certain medical and mental health conditions. What are the signs or symptoms? If you have insomnia, the main symptom is having trouble falling asleep or having trouble staying asleep. This may lead to other symptoms, such as:  Feeling fatigued or having low energy.  Feeling nervous about going to sleep.  Not feeling rested in the morning.  Having trouble concentrating.  Feeling irritable, anxious, or depressed. How is this diagnosed? This condition may be diagnosed based on:  Your symptoms and medical history. Your health care provider may ask about: ? Your sleep habits. ? Any medical conditions you have. ? Your mental health.  A physical exam. How is this treated? Treatment for insomnia depends on the cause. Treatment may focus on treating an underlying condition that is causing insomnia.  Treatment may also include:  Medicines to help you sleep.  Counseling or therapy.  Lifestyle adjustments to help you sleep better. Follow these instructions at home: Eating and drinking   Limit or avoid alcohol, caffeinated beverages, and cigarettes, especially close to bedtime. These can disrupt your sleep.  Do not eat a large meal or eat spicy foods right before bedtime. This can lead to digestive discomfort that can make it hard for you to sleep. Sleep habits   Keep a sleep diary to help you and your health care provider figure out what could be causing your insomnia. Write down: ? When you sleep. ? When you wake up during the night. ? How well you sleep. ? How rested you feel the next day. ? Any side effects of medicines you are taking. ? What you eat and drink.  Make your bedroom a dark, comfortable place where it is easy to fall asleep. ? Put up shades or blackout curtains to block light from outside. ? Use a white noise machine to block noise. ? Keep the temperature cool.  Limit screen use before bedtime. This includes: ? Watching TV. ? Using your smartphone, tablet, or computer.  Stick to a routine that includes going to bed and waking up at the same times every day and night. This can help you fall asleep faster. Consider making a quiet activity, such as reading, part of your nighttime routine.  Try to avoid taking naps during the day so that you sleep better at night.  Get out of bed if you are still awake after 15 minutes  of trying to sleep. Keep the lights down, but try reading or doing a quiet activity. When you feel sleepy, go back to bed. General instructions  Take over-the-counter and prescription medicines only as told by your health care provider.  Exercise regularly, as told by your health care provider. Avoid exercise starting several hours before bedtime.  Use relaxation techniques to manage stress. Ask your health care provider to suggest some  techniques that may work well for you. These may include: ? Breathing exercises. ? Routines to release muscle tension. ? Visualizing peaceful scenes.  Make sure that you drive carefully. Avoid driving if you feel very sleepy.  Keep all follow-up visits as told by your health care provider. This is important. Contact a health care provider if:  You are tired throughout the day.  You have trouble in your daily routine due to sleepiness.  You continue to have sleep problems, or your sleep problems get worse. Get help right away if:  You have serious thoughts about hurting yourself or someone else. If you ever feel like you may hurt yourself or others, or have thoughts about taking your own life, get help right away. You can go to your nearest emergency department or call:  Your local emergency services (911 in the [place]  A suicide crisis helpline, such as the [facility] at 351-072-1576. This is open 24 hours a day. Summary  Insomnia is a sleep disorder that makes it difficult to fall asleep or stay asleep.  Insomnia can be long-term (chronic) or short-term (acute).  Treatment for insomnia depends on the cause. Treatment may focus on treating an underlying condition that is causing insomnia.  Keep a sleep diary to help you and your health care provider figure out what could be causing your insomnia. This information is not intended to replace advice given to you by your health care provider. Make sure you discuss any questions you have with your health care provider. Document Revised: 09/20/2017 Document Reviewed: 07/18/2017 Elsevier Patient Education  2020 [employer].

## 2020-05-02 ENCOUNTER — Encounter: Payer: Self-pay | Admitting: Adult Health

## 2020-05-02 ENCOUNTER — Ambulatory Visit: Payer: PPO | Admitting: Adult Health

## 2020-05-02 ENCOUNTER — Other Ambulatory Visit: Payer: Self-pay

## 2020-05-02 VITALS — BP 128/68 | HR 78 | Ht 64.0 in | Wt 128.0 lb

## 2020-05-02 DIAGNOSIS — F5104 Psychophysiologic insomnia: Secondary | ICD-10-CM

## 2020-05-02 NOTE — Patient Instructions (Signed)
Your Plan:  Continue Seroquel and Tizanidine If your symptoms worsen or you develop new symptoms please let us know.   Thank you for coming to see us at [facility]. I hope we have been able to provide you high quality care today.  You may receive a patient satisfaction survey over the next few weeks. We would appreciate your feedback and comments so that we may continue to improve ourselves and the health of our patients.  

## 2020-05-02 NOTE — Progress Notes (Signed)
PATIENT: [name] DOB: 07/06/1946  REASON FOR VISIT: follow up HISTORY FROM: patient  HISTORY OF PRESENT ILLNESS: Today 05/02/20:  [name] is a 74 year old female with a history of insomnia.  She returns today for follow-up.  She tried trazodone but did not find it beneficial.  She is back on Seroquel and tizanidine.  She reports that the combination works well for her.  She denies any new issues.  She returns today for an evaluation.  HISTORY (Copied from ShellyDohmeier's note) Shelly Quijas Harrisonis a 74 y.o. year old White or Caucasian female patientseen here on 10/26/2019. I have the pleasure of seeing [name] today,a right -handed White or Caucasian female with a possible sleep disorder. She has a  has a past medical history of Arthritis, Cataract, Fracture (10/2016), History of hiatal hernia, Hypothyroidism, Insomnia due to anxiety and fear, and Snoring (09/29/2014).Marland Kitchen  HPI:  [name] is a 74 y.o. female  Is seen here as a revisit  from Dr. Birdie Sanders for chronic insomnia, treated with Seroquel .  Her last 4 appointments  were with [name], [facility].  She reports cyclic insomnia, and related to stressful situation. Her father was an alcoholic and air Customer service manager. The patient moved a lot with her family and has had lifelong fear of going to sleep, related to her experiences with an intoxicated father coming home and being belligerent, but she was never physically abused. Marland Kitchen  She was born on [place], her brother in [place] and her sister in [place]. She never lived in a place longer than 4 years. She lives in [place] since 1974.    Patient returns for followup after last visit 12-302019- Cervical spine disease, controlled with regular exercise and massages. She has a history of chronic insomnia and is currently on Seroquel doing well on that medication she continues to exercise daily. She has no new neurologic complaints.   REVIEW OF SYSTEMS: Out of a complete 14 system  review of symptoms, the patient complains only of the following symptoms, and all other reviewed systems are negative.  See HPI Thank you  ALLERGIES: Allergies  Allergen Reactions  . Codeine Nausea And Vomiting  . Formaldehyde Other (See Comments)    Allergy testing, unknown reaction    HOME MEDICATIONS: Outpatient Medications Prior to Visit  Medication Sig Dispense Refill  . Biotin 5 MG CAPS Take 5 mg by mouth daily.    . Calcium Carbonate-Vitamin D (CALCIUM + D PO) Take 1 tablet by mouth daily.     . Cholecalciferol (VITAMIN D PO) Take by mouth.    . Evening Primrose Oil CAPS Take 2 capsules by mouth daily.    Marland Kitchen GLUCOSAMINE PO Take 3,000 mg by mouth.     . levothyroxine (SYNTHROID) 50 MCG tablet TAKE 1 TABLET(50 MCG) BY MOUTH DAILY 90 tablet 1  . MILK THISTLE PO Take by mouth.    . Multiple Vitamin (MULTIVITAMIN) capsule Take 1 capsule by mouth daily.    . Omega-3 Fatty Acids (OMEGA 3 PO) Take 3,600 mg by mouth.     . QUEtiapine (SEROQUEL) 25 MG tablet Take 3 capsules at bedtime 270 tablet 3  . tiZANidine (ZANAFLEX) 4 MG tablet TAKE 1 TABLET(4 MG) BY MOUTH AT BEDTIME 90 tablet 3  . triamterene-hydrochlorothiazide (DYAZIDE) 37.5-25 MG capsule Take 1 each (1 capsule total) by mouth every morning. 90 capsule 4  . cetirizine (ZYRTEC) 10 MG tablet Take 1 tablet (10 mg total) by mouth daily. (Patient  not taking: Reported on 03/28/2020) 30 tablet 11   No facility-administered medications prior to visit.    PAST MEDICAL HISTORY: Past Medical History:  Diagnosis Date  . Arthritis    Dr Estanislado Sanders  . Cataract   . Fracture 10/2016   left ankel  . History of hiatal hernia    found on endoscopy  . Hypothyroidism   . Insomnia due to anxiety and fear   . Snoring 09/29/2014    PAST SURGICAL HISTORY: Past Surgical History:  Procedure Laterality Date  . ABDOMINAL HYSTERECTOMY  1987   TAH,BSO, APPENDECTOMY  . APPENDECTOMY  1987   APPENDECTOMY AT TAH,BSO  . CATARACT EXTRACTION,  BILATERAL Bilateral    20 plus years ago  . COLONOSCOPY      X 3; [facility]. All negative  . KNEE ARTHROSCOPY Bilateral   . METATARSAL OSTEOTOMY WITH BUNIONECTOMY Left 10/31/2017   Procedure: Left First Metatarsal Scarf, Modified McBride and Akin Osteotomies;  Surgeon: Shelly Simmer, MD;  Location: [facility];  Service: Orthopedics;  Laterality: Left;  . REPLACEMENT TOTAL KNEE Bilateral    X 1 each; Dr Percell Miller  . ROTATOR CUFF REPAIR Right 2010    FAMILY HISTORY: Family History  Problem Relation Age of Onset  . Ovarian cancer Mother 23  . Alcohol abuse Father   . Cirrhosis Father   . Stroke Sister 89  . Diabetes Paternal Grandmother   . Heart disease Neg Hx   . Hypertension Neg Hx   . Hyperlipidemia Neg Hx   . Colon cancer Neg Hx     SOCIAL HISTORY: Social History   Socioeconomic History  . Marital status: Married    Spouse name: Not on file  . Number of children: 0  . Years of education: Not on file  . Highest education level: Not on file  Occupational History  . 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 Smoking status: Never Smoker  . Smokeless tobacco: Never Used  Vaping Use  . Vaping Use: Never used  Substance and Sexual Activity  . Alcohol use: Yes    Alcohol/week: 5.0 standard drinks    Types: 1 Glasses of wine, 4 Standard drinks or equivalent per week    Comment: 1 drink nightly  . Drug use: No  . Sexual activity: Not Currently    Birth control/protection: Surgical    Comment: not much  Other Topics Concern  . Not on file  Social History Narrative   No children    2 dogs    3 horses    Husband patient of Dr. Birdie Sanders    Social Determinants of Health   Financial Resource Strain:   . Difficulty of Paying Living Expenses:   Food Insecurity: No Food Insecurity  . Worried About Charity fundraiser in the Last Year: Never true  . Ran Out of Food in the Last Year: Never true  Transportation  Needs: No Transportation Needs  . Lack of Transportation (Medical): No  . Lack of Transportation (Non-Medical): No  Physical Activity:   . Days of Exercise per Week:   . Minutes of Exercise per Session:   Stress:   . Feeling of Stress :   Social Connections:   . Frequency of Communication with Friends and Family:   . Frequency of Social Gatherings with Friends and Family:   . Attends Religious Services:   . Active Member of Clubs or Organizations:   .  Attends Archivist Meetings:   Marland Kitchen Marital Status:   Intimate Partner Violence:   . Fear of Current or Ex-Partner:   . Emotionally Abused:   Marland Kitchen Physically Abused:   . Sexually Abused:       PHYSICAL EXAM  Vitals:   05/02/20 1020  BP: 128/68  Pulse: 78  Weight: 128 lb (58.1 kg)  Height: 5\' 4"  (1.626 m)   Body mass index is 21.97 kg/m.  Generalized: Well developed, in no acute distress   Neurological examination  Mentation: Alert oriented to time, place, history taking. Follows all commands speech and language fluent Cranial nerve II-XII: Pupils were equal round reactive to light. Extraocular movements were full, visual field were full on confrontational test. . Head turning and shoulder shrug  were normal and symmetric. Motor: The motor testing reveals 5 over 5 strength of all 4 extremities. Good symmetric motor tone is noted throughout.  Sensory: Sensory testing is intact to soft touch on all 4 extremities. No evidence of extinction is noted.  Coordination: Cerebellar testing reveals good finger-nose-finger and heel-to-shin bilaterally.  Gait and station: Gait is normal.   DIAGNOSTIC DATA (LABS, IMAGING, TESTING) - I reviewed patient records, labs, notes, testing and imaging myself where available.  Lab Results  Component Value Date   WBC 5.9 10/27/2019   HGB 14.7 10/27/2019   HCT 43.3 10/27/2019   MCV 99.7 10/27/2019   PLT 203.0 10/27/2019      Component Value Date/Time   NA 137 10/27/2019 0947   NA  139 09/23/2013 1458   K 3.8 10/27/2019 0947   CL 97 10/27/2019 0947   CO2 28 10/27/2019 0947   GLUCOSE 105 (H) 10/27/2019 0947   BUN 15 10/27/2019 0947   BUN 25 09/23/2013 1458   CREATININE 0.71 10/27/2019 0947   CALCIUM 10.2 10/27/2019 0947   PROT 7.2 10/27/2019 0947   PROT 7.3 09/23/2013 1458   ALBUMIN 4.7 10/27/2019 0947   ALBUMIN 4.7 09/23/2013 1458   AST 17 10/27/2019 0947   ALT 36 (H) 10/27/2019 0947   ALKPHOS 50 10/27/2019 0947   BILITOT 0.8 10/27/2019 0947   GFRNONAA 77 09/23/2013 1458   GFRAA 88 09/23/2013 1458   Lab Results  Component Value Date   CHOL 265 (H) 10/27/2019   HDL 108.10 10/27/2019   LDLCALC 131 (H) 10/27/2019   TRIG 132.0 10/27/2019   CHOLHDL 2 10/27/2019    Lab Results  Component Value Date   TSH 4.09 10/27/2019      ASSESSMENT AND PLAN 74 y.o. year old female  has a past medical history of Arthritis, Cataract, Fracture (10/2016), History of hiatal hernia, Hypothyroidism, Insomnia due to anxiety and fear, and Snoring (09/29/2014). here with:  Insomnia   Continue Seroquel and tizanidine  Advised if symptoms worsen or she develops new symptoms she should let us know  Follow-up in 1 year or sooner if needed  I spent 20 minutes of face-to-face and non-face-to-face time with patient.  This included previsit chart review, lab review, study review, order entry, electronic health record documentation, patient education.  Ward Givens, MSN, NP-C 05/02/2020, 10:29 AM [facility] [address] [place], [place] [address] [phone]

## 2020-05-16 DIAGNOSIS — M25522 Pain in left elbow: Secondary | ICD-10-CM | POA: Diagnosis not present

## 2020-05-16 DIAGNOSIS — M25551 Pain in right hip: Secondary | ICD-10-CM | POA: Diagnosis not present

## 2020-05-16 DIAGNOSIS — M1991 Primary osteoarthritis, unspecified site: Secondary | ICD-10-CM | POA: Diagnosis not present

## 2020-05-24 DIAGNOSIS — M25522 Pain in left elbow: Secondary | ICD-10-CM | POA: Diagnosis not present

## 2020-05-24 DIAGNOSIS — M25551 Pain in right hip: Secondary | ICD-10-CM | POA: Diagnosis not present

## 2020-05-24 DIAGNOSIS — M1991 Primary osteoarthritis, unspecified site: Secondary | ICD-10-CM | POA: Diagnosis not present

## 2020-05-26 ENCOUNTER — Other Ambulatory Visit: Payer: Self-pay | Admitting: Family Medicine

## 2020-06-01 DIAGNOSIS — M25551 Pain in right hip: Secondary | ICD-10-CM | POA: Diagnosis not present

## 2020-06-01 DIAGNOSIS — M1991 Primary osteoarthritis, unspecified site: Secondary | ICD-10-CM | POA: Diagnosis not present

## 2020-06-01 DIAGNOSIS — M25522 Pain in left elbow: Secondary | ICD-10-CM | POA: Diagnosis not present

## 2020-06-06 ENCOUNTER — Ambulatory Visit (INDEPENDENT_AMBULATORY_CARE_PROVIDER_SITE_OTHER): Payer: PPO | Admitting: Family Medicine

## 2020-06-06 ENCOUNTER — Ambulatory Visit (INDEPENDENT_AMBULATORY_CARE_PROVIDER_SITE_OTHER): Payer: PPO

## 2020-06-06 ENCOUNTER — Other Ambulatory Visit: Payer: Self-pay

## 2020-06-06 ENCOUNTER — Encounter: Payer: Self-pay | Admitting: Family Medicine

## 2020-06-06 ENCOUNTER — Ambulatory Visit: Payer: Self-pay

## 2020-06-06 DIAGNOSIS — M25551 Pain in right hip: Secondary | ICD-10-CM

## 2020-06-06 NOTE — Progress Notes (Signed)
Office Visit Note   Patient: [name]           Date of Birth: 04/24/1946           MRN: [id number] Visit Date: 06/06/2020 Requested by: Midge Minium, MD [address] [place],  [place] [address] PCP: Midge Minium, MD  Subjective: Chief Complaint  Patient presents with  . Pain right gluteus, intermittent x 1 year    HPI: She is here with right posterior hip pain.  She was referred by Dr. Linton Rump.  Symptoms started more than a year ago, no injury.  Pain in the posterior hip with certain movements, such as when doing Pilates workouts or getting in and out of her car.  No groin pain, no radicular pain, no pain at rest.  She has tried anti-inflammatories as well as physical therapy with dry needling.  She gets temporary relief from the treatments.  She has not noticed any lower back pain.  She has not had any weakness or numbness in her extremities.              ROS: No constitutional symptoms.  All other systems were reviewed and are negative.  Objective: Vital Signs: There were no vitals taken for this visit.  Physical Exam:  General:  Alert and oriented, in no acute distress. Pulm:  Breathing unlabored. Psy:  Normal mood, congruent affect. Skin: No rash.  Female chaperone was present. Low back: No significant lumbar tenderness, no pain over the SI joints.  She is tender near the ischial tuberosity, and a little bit in the region of the piriformis but not at the posterior greater trochanter.  No pain with internal hip rotation, negative straight leg raise.  Lower extremity strength and reflexes are normal.  Imaging: US Guided Needle Placement  Result Date: 06/06/2020 Limited diagnostic ultrasound of the right posterior hip reveal possibly some ischial bursal swelling with no hyperemia on power Doppler imaging.  No obvious muscle or tendon tear seen.   Assessment & Plan: 1.  Chronic right posterior hip pain, could be due to L4-5 foraminal stenosis.   Ischial bursitis is another consideration. -Discussed options with her, she is not had good response to cortisone injections in the past.  We will therefore order lumbar MRI scan and if foraminal stenosis is confirmed, we will have Dr. Vilinda Blanks focus on this in physical therapy to see if her symptoms will resolve.  If not, then either a one-time ischial bursa injection or a lumbar epidural injection.     Procedures: No procedures performed  No notes on file     PMFS History: Patient Active Problem List   Diagnosis Date Noted  . Hypothyroid 01/20/2018  . Physical exam 10/23/2017  . Hypercalciuria 10/23/2017  . Snoring 09/29/2014  . Nonspecific elevation of levels of transaminase or lactic acid dehydrogenase (LDH) 02/09/2014  . Insomnia, persistent 09/23/2013  . Insomnia due to anxiety and fear   . Chest pain 06/17/2013  . GERD (gastroesophageal reflux disease) 05/04/2013  . Displacement of cervical intervertebral disc without myelopathy 03/12/2013  . Osteopenia   . Endometriosis    Past Medical History:  Diagnosis Date  . Arthritis    Dr Estanislado Pandy  . Cataract   . Fracture 10/2016   left ankel  . History of hiatal hernia    found on endoscopy  . Hypothyroidism   . Insomnia due to anxiety and fear   . Snoring 09/29/2014    Family  History  Problem Relation Age of Onset  . Ovarian cancer Mother 62  . Alcohol abuse Father   . Cirrhosis Father   . Stroke Sister 6  . Diabetes Paternal Grandmother   . Heart disease Neg Hx   . Hypertension Neg Hx   . Hyperlipidemia Neg Hx   . Colon cancer Neg Hx     Past Surgical History:  Procedure Laterality Date  . ABDOMINAL HYSTERECTOMY  1987   TAH,BSO, APPENDECTOMY  . APPENDECTOMY  1987   APPENDECTOMY AT [facility]AH,BSO  . CATARACT EXTRACTION, BILATERAL Bilateral    20 plus years ago  . COLONOSCOPY      X 3; [facility]. All negative  . KNEE ARTHROSCOPY Bilateral   . METATARSAL OSTEOTOMY WITH BUNIONECTOMY Left 10/31/2017   Procedure:  Left First Metatarsal Scarf, Modified McBride and Akin Osteotomies;  Surgeon: Wylene Simmer, MD;  Location: [facility];  Service: Orthopedics;  Laterality: Left;  . REPLACEMENT TOTAL KNEE Bilateral    X 1 each; Dr Percell Miller  . ROTATOR CUFF REPAIR Right 2010   Social History   Occupational History  . 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 Smoking status: Never Smoker  . Smokeless tobacco: Never Used  Vaping Use  . Vaping Use: Never used  Substance and Sexual Activity  . Alcohol use: Yes    Alcohol/week: 5.0 standard drinks    Types: 1 Glasses of wine, 4 Standard drinks or equivalent per week    Comment: 1 drink nightly  . Drug use: No  . Sexual activity: Not Currently    Birth control/protection: Surgical    Comment: not much

## 2020-06-16 ENCOUNTER — Telehealth: Payer: Self-pay | Admitting: Family Medicine

## 2020-06-16 NOTE — Telephone Encounter (Signed)
Patient called advised she's having her MRI 06/29/2020 and need valium sent to [facility] in [place]..   The number to contact patient is [phone]

## 2020-06-17 MED ORDER — DIAZEPAM 5 MG PO TABS: ORAL_TABLET | ORAL | 0 refills | Status: DC

## 2020-06-17 NOTE — Telephone Encounter (Signed)
Done

## 2020-06-28 ENCOUNTER — Other Ambulatory Visit: Payer: PPO

## 2020-06-29 ENCOUNTER — Other Ambulatory Visit: Payer: Self-pay

## 2020-06-29 ENCOUNTER — Telehealth: Payer: Self-pay | Admitting: Family Medicine

## 2020-06-29 ENCOUNTER — Ambulatory Visit
Admission: RE | Admit: 2020-06-29 | Discharge: 2020-06-29 | Disposition: A | Discharge status: Home / Self Care | Payer: PPO | Source: Ambulatory Visit | Attending: Family Medicine | Admitting: Family Medicine

## 2020-06-29 DIAGNOSIS — M48061 Spinal stenosis, lumbar region without neurogenic claudication: Secondary | ICD-10-CM | POA: Diagnosis not present

## 2020-06-29 DIAGNOSIS — M25551 Pain in right hip: Secondary | ICD-10-CM

## 2020-06-29 NOTE — Telephone Encounter (Signed)
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  I think this is the likely source of your right hip pain.

## 2020-07-07 ENCOUNTER — Other Ambulatory Visit: Payer: Self-pay

## 2020-07-07 ENCOUNTER — Ambulatory Visit: Payer: Self-pay

## 2020-07-07 ENCOUNTER — Encounter: Payer: Self-pay | Admitting: Family Medicine

## 2020-07-07 ENCOUNTER — Ambulatory Visit (INDEPENDENT_AMBULATORY_CARE_PROVIDER_SITE_OTHER): Payer: PPO | Admitting: Family Medicine

## 2020-07-07 DIAGNOSIS — M25551 Pain in right hip: Secondary | ICD-10-CM | POA: Diagnosis not present

## 2020-07-07 NOTE — Progress Notes (Signed)
Office Visit Note   Patient: [name]           Date of Birth: 01-Jun-1946           MRN: [id number] Visit Date: 07/07/2020 Requested by: Midge Minium, MD [address] [place],  [place] [address] PCP: Midge Minium, MD  Subjective: Chief Complaint  Patient presents with  . Right Hip - Pain    HPI: She is here for follow-up right posterior hip pain.  Since last visit she has been doing physical therapy for dry needling, and treatments aimed at lumbar issues, but unfortunately she does not seem to be making any progress.  Pain remains in the ischial area.              ROS:   All other systems were reviewed and are negative.  Objective: Vital Signs: There were no vitals taken for this visit.  Physical Exam:  General:  Alert and oriented, in no acute distress. Pulm:  Breathing unlabored. Psy:  Normal mood, congruent affect. Skin: No erythema Right hip: She is point tender at the ischial tuberosity today.  Imaging: US Guided Needle Placement - No Linked Charges  Result Date: 07/07/2020 Ultrasound-guided right hip ischial bursa injection: After sterile prep with Betadine, injected 5 cc 1% lidocaine without epinephrine and 40 mg methylprednisolone into the ischial bursa.  She had good relief during the immediate anesthetic phase.   Assessment & Plan: 1.  Chronic right posterior hip pain, possibly due to ischial bursitis versus lumbar foraminal stenosis. -Discussed options with her and elected to inject the bursa today as above.  She will contact me next week to let me know how she is doing.  If this does not help, then could contemplate chiropractic for her lumbar issues or possibly epidural injection.     Procedures: No procedures performed  No notes on file     PMFS History: Patient Active Problem List   Diagnosis Date Noted  . Hypothyroid 01/20/2018  . Physical exam 10/23/2017  . Hypercalciuria 10/23/2017  . Snoring 09/29/2014  . Nonspecific  elevation of levels of transaminase or lactic acid dehydrogenase (LDH) 02/09/2014  . Insomnia, persistent 09/23/2013  . Insomnia due to anxiety and fear   . Chest pain 06/17/2013  . GERD (gastroesophageal reflux disease) 05/04/2013  . Displacement of cervical intervertebral disc without myelopathy 03/12/2013  . Osteopenia   . Endometriosis    Past Medical History:  Diagnosis Date  . Arthritis    Dr Estanislado Pandy  . Cataract   . Fracture 10/2016   left ankel  . History of hiatal hernia    found on endoscopy  . Hypothyroidism   . Insomnia due to anxiety and fear   . Snoring 09/29/2014    Family History  Problem Relation Age of Onset  . Ovarian cancer Mother 33  . Alcohol abuse Father   . Cirrhosis Father   . Stroke Sister 22  . Diabetes Paternal Grandmother   . Heart disease Neg Hx   . Hypertension Neg Hx   . Hyperlipidemia Neg Hx   . Colon cancer Neg Hx     Past Surgical History:  Procedure Laterality Date  . ABDOMINAL HYSTERECTOMY  1987   TAH,BSO, APPENDECTOMY  . APPENDECTOMY  1987   APPENDECTOMY AT [facility]AH,BSO  . CATARACT EXTRACTION, BILATERAL Bilateral    20 plus years ago  . COLONOSCOPY      X 3; [facility]. All negative  . KNEE  ARTHROSCOPY Bilateral   . METATARSAL OSTEOTOMY WITH BUNIONECTOMY Left 10/31/2017   Procedure: Left First Metatarsal Scarf, Modified McBride and Akin Osteotomies;  Surgeon: Wylene Simmer, MD;  Location: [facility];  Service: Orthopedics;  Laterality: Left;  . REPLACEMENT TOTAL KNEE Bilateral    X 1 each; Dr Percell Miller  . ROTATOR CUFF REPAIR Right 2010   Social History   Occupational History  . 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 Smoking status: Never Smoker  . Smokeless tobacco: Never Used  Vaping Use  . Vaping Use: Never used  Substance and Sexual Activity  . Alcohol use: Yes    Alcohol/week: 5.0 standard drinks    Types: 1 Glasses of wine, 4 Standard drinks or equivalent  per week    Comment: 1 drink nightly  . Drug use: No  . Sexual activity: Not Currently    Birth control/protection: Surgical    Comment: not much

## 2020-07-11 ENCOUNTER — Telehealth: Payer: Self-pay

## 2020-07-11 NOTE — Telephone Encounter (Signed)
Patient called in wanting to advise that she is pain free at the moment

## 2020-07-11 NOTE — Telephone Encounter (Signed)
Great!

## 2020-08-10 DIAGNOSIS — L814 Other melanin hyperpigmentation: Secondary | ICD-10-CM | POA: Diagnosis not present

## 2020-08-10 DIAGNOSIS — L821 Other seborrheic keratosis: Secondary | ICD-10-CM | POA: Diagnosis not present

## 2020-08-10 DIAGNOSIS — D2272 Melanocytic nevi of left lower limb, including hip: Secondary | ICD-10-CM | POA: Diagnosis not present

## 2020-08-10 DIAGNOSIS — L218 Other seborrheic dermatitis: Secondary | ICD-10-CM | POA: Diagnosis not present

## 2020-08-10 DIAGNOSIS — L570 Actinic keratosis: Secondary | ICD-10-CM | POA: Diagnosis not present

## 2020-09-26 ENCOUNTER — Telehealth: Payer: Self-pay

## 2020-09-26 ENCOUNTER — Telehealth: Payer: PPO

## 2020-09-26 NOTE — Progress Notes (Signed)
Spoke with patient and informed patient about needing to cancel today's appointment 09/26/20 at 1 pm.   Rescheduled patients appointment for December 17 th at 3:00pm.  Georgiana Shore ,[place] Pharmacist Assistant [phone]

## 2020-10-06 ENCOUNTER — Telehealth: Payer: Self-pay | Admitting: Family Medicine

## 2020-10-06 NOTE — Telephone Encounter (Signed)
Left message for patient to schedule Annual Wellness Visit.  Please schedule with Nurse Health Advisor Martha Stanley, RN at [facility]  

## 2020-10-07 ENCOUNTER — Ambulatory Visit: Payer: PPO

## 2020-10-07 DIAGNOSIS — G4700 Insomnia, unspecified: Secondary | ICD-10-CM

## 2020-10-07 DIAGNOSIS — E039 Hypothyroidism, unspecified: Secondary | ICD-10-CM

## 2020-10-07 NOTE — Progress Notes (Signed)
Chronic Care Management Pharmacy  Name: [name]  MRN: [id number] DOB: 06-12-1946  Chief Complaint/ HPI  [name],  74 y.o. , female presents for their Follow-Up CCM visit with the clinical pharmacist via telephone due to COVID-19 Pandemic.  PCP : Midge Minium, MD  Encounter Diagnoses  Name Primary?  . Insomnia, persistent Yes  . Hypothyroidism, unspecified type    Patient Active Problem List   Diagnosis Date Noted  . Hypothyroid 01/20/2018  . Physical exam 10/23/2017  . Hypercalciuria 10/23/2017  . Snoring 09/29/2014  . Nonspecific elevation of levels of transaminase or lactic acid dehydrogenase (LDH) 02/09/2014  . Insomnia, persistent 09/23/2013  . Insomnia due to anxiety and fear   . Chest pain 06/17/2013  . GERD (gastroesophageal reflux disease) 05/04/2013  . Displacement of cervical intervertebral disc without myelopathy 03/12/2013  . Osteopenia   . Endometriosis    Past Surgical History:  Procedure Laterality Date  . ABDOMINAL HYSTERECTOMY  1987   [facility], APPENDECTOMY  . APPENDECTOMY  1987   APPENDECTOMY AT [facility],BSO  . CATARACT EXTRACTION, BILATERAL Bilateral    20 plus years ago  . COLONOSCOPY      X 3; [facility]. All negative  . KNEE ARTHROSCOPY Bilateral   . METATARSAL OSTEOTOMY WITH BUNIONECTOMY Left 10/31/2017   Procedure: Left First Metatarsal Scarf, Modified McBride and Akin Osteotomies;  Surgeon: Wylene Simmer, MD;  Location: [facility];  Service: Orthopedics;  Laterality: Left;  . REPLACEMENT TOTAL KNEE Bilateral    X 1 each; Dr Percell Miller  . ROTATOR CUFF REPAIR Right 2010    Social History   Socioeconomic History  . Marital status: Married    Spouse name: Not on file  . Number of children: 0  . Years of education: Not on file  . Highest education level: Not on file  Occupational History  . 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 Smoking status: Never Smoker  .  Smokeless tobacco: Never Used  Vaping Use  . Vaping Use: Never used  Substance and Sexual Activity  . Alcohol use: Yes    Alcohol/week: 5.0 standard drinks    Types: 1 Glasses of wine, 4 Standard drinks or equivalent per week    Comment: 1 drink nightly  . Drug use: No  . Sexual activity: Not Currently    Birth control/protection: Surgical    Comment: not much  Other Topics Concern  . Not on file  Social History Narrative   No children    2 dogs    3 horses    Husband patient of Dr. Birdie Riddle    Social Determinants of Health   Financial Resource Strain: Not on file  Food Insecurity: No Food Insecurity  . Worried About Charity fundraiser in the Last Year: Never true  . Ran Out of Food in the Last Year: Never true  Transportation Needs: No Transportation Needs  . Lack of Transportation (Medical): No  . Lack of Transportation (Non-Medical): No  Physical Activity: Not on file  Stress: Not on file  Social Connections: Not on file   Family History  Problem Relation Age of Onset  . Ovarian cancer Mother 69  . Alcohol abuse Father   . Cirrhosis Father   . Stroke Sister 38  . Diabetes Paternal Grandmother   . Heart disease Neg Hx   . Hypertension Neg Hx   . Hyperlipidemia Neg Hx   .  Colon cancer Neg Hx    Allergies  Allergen Reactions  . Codeine Nausea And Vomiting  . Formaldehyde Other (See Comments)    Allergy testing, unknown reaction   Patient Care Team    Relationship Specialty Notifications Start End  Midge Minium, MD PCP - General Family Medicine  01/30/16   Huel Cote, NP (Inactive) Nurse Practitioner Obstetrics and Gynecology  07/11/16   Dennie Bible, NP Nurse Practitioner Family Medicine  07/11/16   Dohmeier, Asencion Partridge, MD Consulting Physician Neurology  07/17/17   Gale Journey, Gwen Her, MD Referring Physician Endocrinology  07/17/17   Kelton Pillar, MD Referring Physician Orthopedic Surgery  07/17/17   Harriett Sine, MD Consulting Physician  Dermatology  10/14/19   [place], Heather, [facility] Physician Optometry  10/14/19   Madelin Rear, [facility] Pharmacist Pharmacist  01/22/20    Comment: phone number [phone]  Linton Rump, PT Physical Therapist Physical Therapy  06/06/20      Outpatient Encounter Medications as of 10/07/2020  Medication Sig  . Biotin 5 MG CAPS Take 5 mg by mouth daily.  . Calcium Carbonate-Vitamin D (CALCIUM + D PO) Take 1 tablet by mouth daily.   . Cholecalciferol (VITAMIN D PO) Take by mouth.  . diazepam (VALIUM) 5 MG tablet 1 PO 1 hour before MRI, repeat prn  . Evening Primrose Oil CAPS Take 2 capsules by mouth daily.  Marland Kitchen GLUCOSAMINE PO Take 3,000 mg by mouth.   . levothyroxine (SYNTHROID) 50 MCG tablet TAKE 1 TABLET(50 MCG) BY MOUTH DAILY  . MILK THISTLE PO Take by mouth.  . Multiple Vitamin (MULTIVITAMIN) capsule Take 1 capsule by mouth daily.  . Omega-3 Fatty Acids (OMEGA 3 PO) Take 3,600 mg by mouth.   . QUEtiapine (SEROQUEL) 25 MG tablet Take 3 capsules at bedtime  . tiZANidine (ZANAFLEX) 4 MG tablet TAKE 1 TABLET(4 MG) BY MOUTH AT BEDTIME  . triamterene-hydrochlorothiazide (DYAZIDE) 37.5-25 MG capsule Take 1 each (1 capsule total) by mouth every morning.   No facility-administered encounter medications on file as of 10/07/2020.   Current Diagnosis/Assessment:  Goals Addressed            This Visit's Progress   . PharmD Care Plan   On track    CARE PLAN ENTRY  Current Barriers:  . Chronic Disease Management support, education, and care coordination needs related to Hypothyroidism, Osteopopenia, and insomnia   Insomnia . Pharmacist Clinical Goal(s) o Over the next 180 days, patient will work with PharmD and providers to minimize symptoms of insomnia . Current regimen:  o Quetiapine 75 mg every night at bedtime . Interventions: o Continue current management . Patient self care activities - Over the next 180 days, patient will: o Continue current management Osteopenia . Pharmacist  Clinical Goal(s) o Over the next 180 days, patient will work with PharmD and providers to maintain bone health . Current regimen:  o Calcium Carbonate-Vitamin D (CALCIUM + D PO) once daily  . Interventions: o Continue current management . Patient self care activities - Over the next 180 days, patient will: o Continue current management  Hypothyroidism . Pharmacist Clinical Goal(s) o Over the next 180 days, patient will work with PharmD and providers to maintain goal TSH levels . Current regimen:  o Levothyroxine 50 mcg daily  . Interventions: o Continue current management . Patient self care activities - Over the next 180 days, patient will: o Continue current management  Medication management . Pharmacist Clinical Goal(s): o Over the next 180 days,  patient will work with PharmD and providers to maintain optimal medication adherence . Current pharmacy: [facility]  . Interventions o Comprehensive medication review performed. o Continue current medication management strategy . Patient self care activities - Over the next 180 days, patient will: o Focus on medication adherence - Continue current management o Take medications as prescribed o Report any questions or concerns to PharmD and/or provider(s)  Initial goal documentation.      Current Diagnosis/Assessment: Goals Addressed            This Visit's Progress   . PharmD Care Plan   On track    CARE PLAN ENTRY  Current Barriers:  . Chronic Disease Management support, education, and care coordination needs related to Hypothyroidism, Osteopopenia, and insomnia   Insomnia . Pharmacist Clinical Goal(s) o Over the next 180 days, patient will work with PharmD and providers to minimize symptoms of insomnia . Current regimen:  o Quetiapine 75 mg every night at bedtime . Interventions: o Continue current management . Patient self care activities - Over the next 180 days, patient will: o Continue current  management Osteopenia . Pharmacist Clinical Goal(s) o Over the next 180 days, patient will work with PharmD and providers to maintain bone health . Current regimen:  o Calcium Carbonate-Vitamin D (CALCIUM + D PO) once daily  . Interventions: o Continue current management . Patient self care activities - Over the next 180 days, patient will: o Continue current management  Hypothyroidism . Pharmacist Clinical Goal(s) o Over the next 180 days, patient will work with PharmD and providers to maintain goal TSH levels . Current regimen:  o Levothyroxine 50 mcg daily  . Interventions: o Continue current management . Patient self care activities - Over the next 180 days, patient will: o Continue current management  Medication management . Pharmacist Clinical Goal(s): o Over the next 180 days, patient will work with PharmD and providers to maintain optimal medication adherence . Current pharmacy: [facility]  . Interventions o Comprehensive medication review performed. o Continue current medication management strategy . Patient self care activities - Over the next 180 days, patient will: o Focus on medication adherence - Continue current management o Take medications as prescribed o Report any questions or concerns to PharmD and/or provider(s)  Initial goal documentation.       hypercalciuria   BP Readings from Last 3 Encounters:  05/02/20 128/68  02/26/20 121/81  10/27/19 130/78   BMP Latest Ref Rng & Units 10/27/2019 10/24/2018 10/23/2017  Glucose 70 - 99 mg/dL 105(H) 89 100(H)  BUN 6 - 23 mg/dL 15 18 20   Creatinine 0.40 - 1.20 mg/dL 0.71 0.71 0.65  BUN/Creat Ratio 11 - 26 - - -  Sodium 135 - 145 mEq/L 137 137 137  Potassium 3.5 - 5.1 mEq/L 3.8 3.8 3.7  Chloride 96 - 112 mEq/L 97 97 98  CO2 19 - 32 mEq/L 28 29 29   Calcium 8.4 - 10.5 mg/dL 10.2 10.2 10.4   Patient checks BP at home infrequently. Denies any issues with dizziness.  Has f/u with PCP next month. Patient is  currently controlled on the following medications:  . Dyazide 37.5-25 mg daily   Plan  Continue current medications    Hypothyroidism   Lab Results  Component Value Date/Time   TSH 4.09 10/27/2019 09:47 AM   TSH 3.23 10/24/2018 08:37 AM   TSH 2.95 11/26/2017 09:09 AM   We discussed proper administration of levothyroxine.  Has annual PCP visit next month. No side  effects reported, consistently taking first thing in AM. Patient is currently controlled on the following medications:  . Levothyroxine 50 mcg daily  Reviewed proper administration - no issues with remembering to take first thing in the morning.   Plan  Continue current medications  Osteopenia   Last DEXA Scan: 2018. Osteopenia.   VITD  Date Value Ref Range Status  10/27/2019 32.47 30.00 - 100.00 ng/mL Final    Patient is not a candidate for pharmacologic treatment  We discussed:  Recommend [phone] units of vitamin D daily. Recommend 1200 mg of calcium daily from dietary and supplemental sources. Recommend weight-bearing and muscle strengthening exercises for building and maintaining bone density.  Patient is currently controlled on the following medications:   Calcium Carbonate-Vitamin D (CALCIUM + D PO) daily  Plan  Continue current medications   Insomnia    Previous medications: Trazodone. Medication helps with sleep but continues to wake up in middle of night - feels this has always happened and will continue to happen.  Denies tiredness/drowsiness/CNS effects. Patient is currently controlled on the following medications:   Quetiapine 75 mg every night at bedtime   Plan  Continue current medications.   Vaccines   Reviewed and discussed patient's vaccination history.  Up to date on recommended vaccines.   Immunization History  Administered Date(s) Administered  . Influenza, High Dose Seasonal PF 07/17/2017, 07/13/2018, 06/10/2019, 08/06/2020  . Influenza,inj,Quad PF,6+ Mos 07/11/2016  .  Influenza-Unspecified 08/12/2014, 07/19/2015  . PFIZER SARS-COV-2 Vaccination 11/11/2019, 12/02/2019, 06/26/2020  . Pneumococcal Conjugate-13 08/12/2014  . Pneumococcal Polysaccharide-23 09/10/2012, 10/31/2019  . Tdap 08/12/2014  . Zoster 01/18/2012  . Zoster Recombinat (Shingrix) 09/02/2017, 09/27/2019   [employer] received - record updated.  Plan  No recommendations   Medication Management / Care Coordination   Receives prescription medications from:  [id number] - [place], [place] - [address] [place] [address] Phone: [phone] Fax: [phone]  Future Appointments  Date Time Provider [place]  11/10/2020 10:00 AM Midge Minium, MD [facility]  05/01/2021  1:30 PM Ward Givens, NP [facility] None  07/13/2021  1:30 PM [facility] PHARMACIST [facility]    Plan  Continue current medication management strategy.  Follow up: 8-10 month phone visit. ______________ Visit Information SDOH (Social Determinants of Health) assessments performed: Yes.  Madelin Rear, Pharm.D., BCGP Clinical Pharmacist [facility] [phone]

## 2020-10-07 NOTE — Patient Instructions (Addendum)
[name],  Thank you for taking the time to review your medications with me today.  I have included our care plan/goals in the following pages. Please review and call me at [phone] with any questions!  Thanks! [name], Pharm.D., BCGP Clinical Pharmacist [facility] [phone]  Goals Addressed            This Visit's Progress   . PharmD Care Plan   On track    CARE PLAN ENTRY  Current Barriers:  . Chronic Disease Management support, education, and care coordination needs related to Hypothyroidism, Osteopopenia, and insomnia   Insomnia . Pharmacist Clinical Goal(s) o Over the next 180 days, patient will work with PharmD and providers to minimize symptoms of insomnia . Current regimen:  o Quetiapine 75 mg every night at bedtime . Interventions: o Continue current management . Patient self care activities - Over the next 180 days, patient will: o Continue current management Osteopenia . Pharmacist Clinical Goal(s) o Over the next 180 days, patient will work with PharmD and providers to maintain bone health . Current regimen:  o Calcium Carbonate-Vitamin D (CALCIUM + D PO) once daily  . Interventions: o Continue current management . Patient self care activities - Over the next 180 days, patient will: o Continue current management  Hypothyroidism . Pharmacist Clinical Goal(s) o Over the next 180 days, patient will work with PharmD and providers to maintain goal TSH levels . Current regimen:  o Levothyroxine 50 mcg daily  . Interventions: o Continue current management . Patient self care activities - Over the next 180 days, patient will: o Continue current management  Medication management . Pharmacist Clinical Goal(s): o Over the next 180 days, patient will work with PharmD and providers to maintain optimal medication adherence . Current pharmacy: [facility]  . Interventions o Comprehensive medication review  performed. o Continue current medication management strategy . Patient self care activities - Over the next 180 days, patient will: o Focus on medication adherence - Continue current management o Take medications as prescribed o Report any questions or concerns to PharmD and/or provider(s)  Initial goal documentation.      The patient verbalized understanding of instructions provided today and agreed to receive a printed copy of patient instruction and/or educational materials. Telephone follow up appointment with pharmacy team member scheduled for: See next appointment with "Care Management Staff" under "What's Next" below.

## 2020-10-31 ENCOUNTER — Encounter: Payer: PPO | Admitting: Family Medicine

## 2020-11-10 ENCOUNTER — Encounter: Payer: PPO | Admitting: Family Medicine

## 2020-11-21 ENCOUNTER — Telehealth: Payer: Self-pay | Admitting: Family Medicine

## 2020-11-21 NOTE — Telephone Encounter (Signed)
Patient notified that rx was sent in today.

## 2020-11-21 NOTE — Telephone Encounter (Signed)
Patient states that she needs this medication.  Dr. Birdie Riddle had told her that she would take it forever, but the pharmacy said that Dr. Birdie Riddle had denied it.  Please advise.

## 2020-12-01 ENCOUNTER — Ambulatory Visit (INDEPENDENT_AMBULATORY_CARE_PROVIDER_SITE_OTHER): Payer: PPO | Admitting: Family Medicine

## 2020-12-01 ENCOUNTER — Other Ambulatory Visit: Payer: Self-pay

## 2020-12-01 ENCOUNTER — Other Ambulatory Visit: Payer: Self-pay | Admitting: Neurology

## 2020-12-01 ENCOUNTER — Encounter: Payer: Self-pay | Admitting: Family Medicine

## 2020-12-01 VITALS — BP 122/70 | HR 86 | Temp 97.7°F | Resp 17 | Ht 63.0 in | Wt 129.0 lb

## 2020-12-01 DIAGNOSIS — M8580 Other specified disorders of bone density and structure, unspecified site: Secondary | ICD-10-CM

## 2020-12-01 DIAGNOSIS — Z0000 Encounter for general adult medical examination without abnormal findings: Secondary | ICD-10-CM | POA: Diagnosis not present

## 2020-12-01 DIAGNOSIS — E039 Hypothyroidism, unspecified: Secondary | ICD-10-CM

## 2020-12-01 LAB — HEPATIC FUNCTION PANEL
ALT: 28 U/L (ref 0–35)
AST: 16 U/L (ref 0–37)
Albumin: 4.9 g/dL (ref 3.5–5.2)
Alkaline Phosphatase: 49 U/L (ref 39–117)
Bilirubin, Direct: 0.1 mg/dL (ref 0.0–0.3)
Total Bilirubin: 0.8 mg/dL (ref 0.2–1.2)
Total Protein: 7.8 g/dL (ref 6.0–8.3)

## 2020-12-01 LAB — CBC WITH DIFFERENTIAL/PLATELET
Basophils Absolute: 0 10*3/uL (ref 0.0–0.1)
Basophils Relative: 0.5 % (ref 0.0–3.0)
Eosinophils Absolute: 0.2 10*3/uL (ref 0.0–0.7)
Eosinophils Relative: 2.4 % (ref 0.0–5.0)
HCT: 43.6 % (ref 36.0–46.0)
Hemoglobin: 14.9 g/dL (ref 12.0–15.0)
Lymphocytes Relative: 15.9 % (ref 12.0–46.0)
Lymphs Abs: 1.3 10*3/uL (ref 0.7–4.0)
MCHC: 34.3 g/dL (ref 30.0–36.0)
MCV: 98.3 fl (ref 78.0–100.0)
Monocytes Absolute: 0.7 10*3/uL (ref 0.1–1.0)
Monocytes Relative: 8.6 % (ref 3.0–12.0)
Neutro Abs: 6.1 10*3/uL (ref 1.4–7.7)
Neutrophils Relative %: 72.6 % (ref 43.0–77.0)
Platelets: 215 10*3/uL (ref 150.0–400.0)
RBC: 4.43 Mil/uL (ref 3.87–5.11)
RDW: 13.1 % (ref 11.5–15.5)
WBC: 8.4 10*3/uL (ref 4.0–10.5)

## 2020-12-01 LAB — LIPID PANEL
Cholesterol: 238 mg/dL — ABNORMAL HIGH (ref 0–200)
HDL: 107.6 mg/dL (ref >39.00)
LDL Cholesterol: 112 mg/dL — ABNORMAL HIGH (ref 0–99)
NonHDL: 130.4
Total CHOL/HDL Ratio: 2
Triglycerides: 92 mg/dL (ref 0.0–149.0)
VLDL: 18.4 mg/dL (ref 0.0–40.0)

## 2020-12-01 LAB — BASIC METABOLIC PANEL
BUN: 16 mg/dL (ref 6–23)
CO2: 29 mEq/L (ref 19–32)
Calcium: 10.3 mg/dL (ref 8.4–10.5)
Chloride: 98 mEq/L (ref 96–112)
Creatinine, Ser: 0.86 mg/dL (ref 0.40–1.20)
GFR: 66.49 mL/min (ref >60.00)
Glucose, Bld: 91 mg/dL (ref 70–99)
Potassium: 3.5 mEq/L (ref 3.5–5.1)
Sodium: 137 mEq/L (ref 135–145)

## 2020-12-01 LAB — TSH: TSH: 4.02 u[IU]/mL (ref 0.35–4.50)

## 2020-12-01 LAB — VITAMIN D 25 HYDROXY (VIT D DEFICIENCY, FRACTURES): VITD: 35.19 ng/mL (ref 30.00–100.00)

## 2020-12-01 NOTE — Assessment & Plan Note (Signed)
Chronic problem.  Check Vit D and replete prn. 

## 2020-12-01 NOTE — Progress Notes (Signed)
Subjective:    Patient ID: [name], female    DOB: 02-01-1946, 75 y.o.   MRN: [id number]  HPI CPE- UTD on colonoscopy, mammo, DEXA, pneumonia vaccines, flu, Tdap, and COVID.  Reviewed past medical, surgical, family and social histories.   Patient Care Team    Relationship Specialty Notifications Start End  Midge Minium, MD PCP - General Family Medicine  01/30/16   Huel Cote, NP (Inactive) Nurse Practitioner Obstetrics and Gynecology  07/11/16   Dennie Bible, NP Nurse Practitioner Family Medicine  07/11/16   Dohmeier, Asencion Partridge, MD Consulting Physician Neurology  07/17/17   Gale Journey, Gwen Her, MD Referring Physician Endocrinology  07/17/17   Kelton Pillar, MD Referring Physician Orthopedic Surgery  07/17/17   Harriett Sine, MD Consulting Physician Dermatology  10/14/19   [place], Heather, [place] Physician Optometry  10/14/19   Madelin Rear, [facility] Pharmacist Pharmacist  01/22/20    Comment: phone number [phone]  Linton Rump, PT Physical Therapist Physical Therapy  06/06/20      Health Maintenance  Topic Date Due  . MAMMOGRAM  03/18/2022  . COLONOSCOPY (Pts 45-32yrs Insurance coverage will need to be confirmed)  03/11/2023  . TETANUS/TDAP  08/12/2024  . INFLUENZA VACCINE  Completed  . DEXA SCAN  Completed  . COVID-19 Vaccine  Completed  . Hepatitis C Screening  Completed  . PNA vac Low Risk Adult  Completed      Review of Systems Patient reports no vision/ hearing changes, adenopathy,fever, weight change,  persistant/recurrent hoarseness , swallowing issues, chest pain, palpitations, edema, persistant/recurrent cough, hemoptysis, dyspnea (rest/exertional/paroxysmal nocturnal), gastrointestinal bleeding (melena, rectal bleeding), abdominal pain, significant heartburn, bowel changes, GU symptoms (dysuria, hematuria, incontinence), Gyn symptoms (abnormal  bleeding, pain),  syncope, focal weakness, memory loss, numbness & tingling, skin/hair/nail  changes, abnormal bruising or bleeding, anxiety, or depression.   This visit occurred during the SARS-CoV-2 public health emergency.  Safety protocols were in place, including screening questions prior to the visit, additional usage of staff PPE, and extensive cleaning of exam room while observing appropriate contact time as indicated for disinfecting solutions.       Objective:   Physical Exam General Appearance:    Alert, cooperative, no distress, appears stated age  Head:    Normocephalic, without obvious abnormality, atraumatic  Eyes:    PERRL, conjunctiva/corneas clear, EOM's intact, fundi    benign, both eyes  Ears:    Normal TM's and external ear canals, both ears  Nose:   Deferred due to COVID  Throat:   Neck:   Supple, symmetrical, trachea midline, no adenopathy;    Thyroid: no enlargement/tenderness/nodules  Back:     Symmetric, no curvature, ROM normal, no CVA tenderness  Lungs:     Clear to auscultation bilaterally, respirations unlabored  Chest Wall:    No tenderness or deformity   Heart:    Regular rate and rhythm, S1 and S2 normal, no murmur, rub   or gallop  Breast Exam:    Deferred to GYN  Abdomen:     Soft, non-tender, bowel sounds active all four quadrants,    no masses, no organomegaly  Genitalia:    Deferred to GYN  Rectal:    Extremities:   Extremities normal, atraumatic, no cyanosis or edema  Pulses:   2+ and symmetric all extremities  Skin:   Skin color, texture, turgor normal, no rashes or lesions  Lymph nodes:   Cervical, supraclavicular, and axillary nodes normal  Neurologic:  CNII-XII intact, normal strength, sensation and reflexes    throughout          Assessment & Plan:

## 2020-12-01 NOTE — Patient Instructions (Addendum)
Follow up in 1 year or as needed We'll notify you of your lab results and make any changes if needed Continue to work on healthy diet and regular exercise- you look great! Call with any questions or concerns Stay Safe!  Stay Healthy! 

## 2020-12-01 NOTE — Assessment & Plan Note (Signed)
Pt's PE WNL.  UTD on colonoscopy, mammo, DEXA, vaccines.  Check labs.  Anticipatory guidance provided.

## 2020-12-01 NOTE — Assessment & Plan Note (Signed)
Chronic problem.  Currently asymptomatic.  Check labs.  Adjust meds prn  

## 2020-12-19 DIAGNOSIS — M170 Bilateral primary osteoarthritis of knee: Secondary | ICD-10-CM | POA: Diagnosis not present

## 2020-12-19 DIAGNOSIS — M25551 Pain in right hip: Secondary | ICD-10-CM | POA: Diagnosis not present

## 2020-12-28 DIAGNOSIS — M545 Low back pain, unspecified: Secondary | ICD-10-CM | POA: Diagnosis not present

## 2020-12-30 ENCOUNTER — Encounter: Payer: PPO | Admitting: Plastic Surgery

## 2021-01-02 DIAGNOSIS — M5416 Radiculopathy, lumbar region: Secondary | ICD-10-CM | POA: Diagnosis not present

## 2021-01-10 ENCOUNTER — Other Ambulatory Visit: Payer: Self-pay

## 2021-01-10 ENCOUNTER — Encounter: Payer: Self-pay | Admitting: Plastic Surgery

## 2021-01-10 ENCOUNTER — Ambulatory Visit (INDEPENDENT_AMBULATORY_CARE_PROVIDER_SITE_OTHER): Payer: Self-pay | Admitting: Plastic Surgery

## 2021-01-10 DIAGNOSIS — Z719 Counseling, unspecified: Secondary | ICD-10-CM

## 2021-01-10 NOTE — Progress Notes (Signed)

## 2021-01-18 ENCOUNTER — Encounter: Payer: Self-pay | Admitting: Plastic Surgery

## 2021-01-18 DIAGNOSIS — M5416 Radiculopathy, lumbar region: Secondary | ICD-10-CM | POA: Diagnosis not present

## 2021-01-20 ENCOUNTER — Encounter: Payer: Self-pay | Admitting: Plastic Surgery

## 2021-01-20 ENCOUNTER — Other Ambulatory Visit: Payer: Self-pay

## 2021-01-20 ENCOUNTER — Ambulatory Visit (INDEPENDENT_AMBULATORY_CARE_PROVIDER_SITE_OTHER): Payer: Self-pay | Admitting: Plastic Surgery

## 2021-01-20 VITALS — BP 152/76 | HR 83

## 2021-01-20 DIAGNOSIS — Z719 Counseling, unspecified: Secondary | ICD-10-CM

## 2021-01-20 NOTE — Progress Notes (Signed)
Patient had Botox a week ago.  She is like to see if she can get a little bit more in the glabellar area to calm down her right attendance.  I explained it may make her look like her eyes are further apart.  She said she wants to go ahead and give it a try.  A total of 8 Units of botulinum toxin was used.  All of it was placed in the glabella area.  No complications were noted. Light pressure was held for 5 minutes. She was instructed explicitly in post-operative care.  Botox LOT:  C [id number] C4 EXP:  5/24

## 2021-01-30 DIAGNOSIS — M5416 Radiculopathy, lumbar region: Secondary | ICD-10-CM | POA: Diagnosis not present

## 2021-02-15 ENCOUNTER — Telehealth: Payer: Self-pay | Admitting: Family Medicine

## 2021-02-15 DIAGNOSIS — M5416 Radiculopathy, lumbar region: Secondary | ICD-10-CM | POA: Diagnosis not present

## 2021-02-15 NOTE — Telephone Encounter (Signed)
Spoke with patient spouse he stated she will call back today.

## 2021-02-27 DIAGNOSIS — M5416 Radiculopathy, lumbar region: Secondary | ICD-10-CM | POA: Diagnosis not present

## 2021-03-02 DIAGNOSIS — M7061 Trochanteric bursitis, right hip: Secondary | ICD-10-CM | POA: Diagnosis not present

## 2021-03-02 DIAGNOSIS — R262 Difficulty in walking, not elsewhere classified: Secondary | ICD-10-CM | POA: Diagnosis not present

## 2021-03-02 DIAGNOSIS — M5416 Radiculopathy, lumbar region: Secondary | ICD-10-CM | POA: Diagnosis not present

## 2021-03-02 DIAGNOSIS — M6281 Muscle weakness (generalized): Secondary | ICD-10-CM | POA: Diagnosis not present

## 2021-03-06 ENCOUNTER — Telehealth: Payer: Self-pay | Admitting: Family Medicine

## 2021-03-06 ENCOUNTER — Other Ambulatory Visit: Payer: Self-pay

## 2021-03-06 MED ORDER — LEVOTHYROXINE SODIUM 50 MCG PO TABS: 50.0000 ug | ORAL_TABLET | Freq: Every day | ORAL | 1 refills | Status: DC

## 2021-03-06 NOTE — Telephone Encounter (Signed)
Patient needs to know if Dr. Birdie Riddle wants here to stay on her thyroid medication - There are no refills on it - Please send in to [facility]

## 2021-03-06 NOTE — Telephone Encounter (Signed)
Prescription sent to pharmacy and patient notified.  

## 2021-03-09 DIAGNOSIS — M5416 Radiculopathy, lumbar region: Secondary | ICD-10-CM | POA: Diagnosis not present

## 2021-03-13 DIAGNOSIS — M5416 Radiculopathy, lumbar region: Secondary | ICD-10-CM | POA: Diagnosis not present

## 2021-03-16 IMAGING — MR MR LUMBAR SPINE W/O CM
4 of 5 series · 20 of 48 positions shown · non-contrast
Comparison: 06/06/2020

CLINICAL DATA: Low back pain extending to both buttocks and legs

EXAM:
MRI LUMBAR SPINE WITHOUT CONTRAST
TECHNIQUE: Multiplanar, multisequence MR imaging of the lumbar spine was
performed. No intravenous contrast was administered.

[Series 5: T2 · sagittal · 4.0mm · 0.73mm/px · 6 of 18 slices shown (1 of 2)]
[im 1/18]
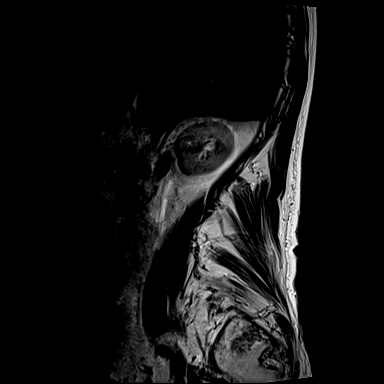
[im 4/18]
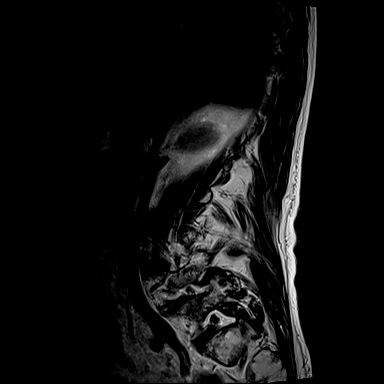
[im 7/18]
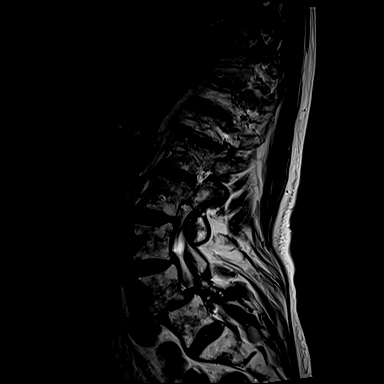
[im 11/18]
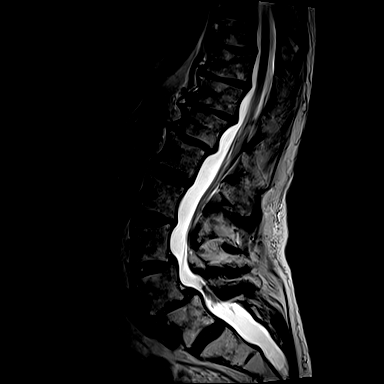
[im 14/18]
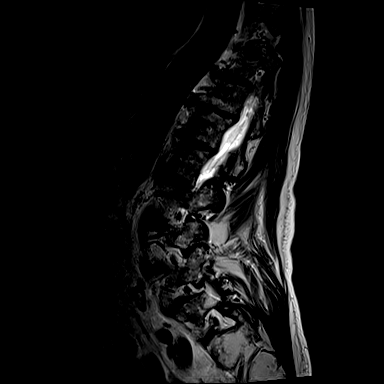
[im 18/18]
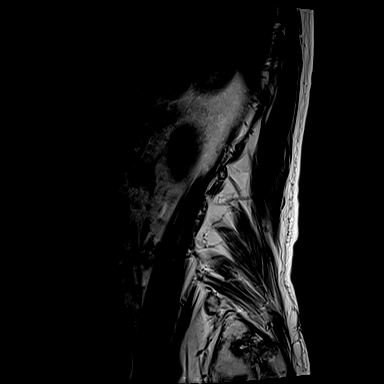

[Series 6: T1 · sagittal · 4.0mm · 0.73mm/px · 3 of 18 slices shown (1 of 2)]
[im 3/18]
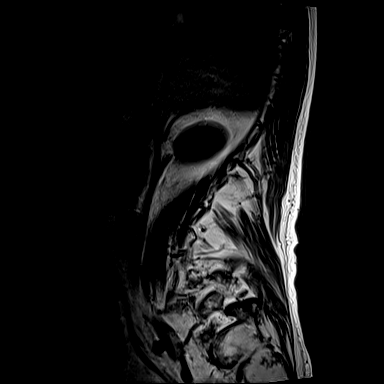
[im 9/18]
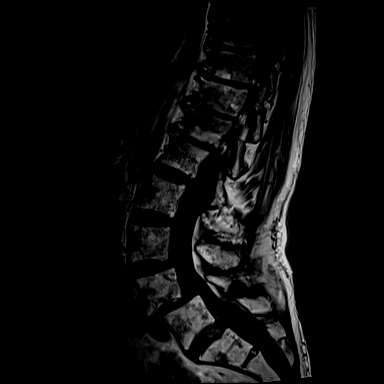
[im 15/18]
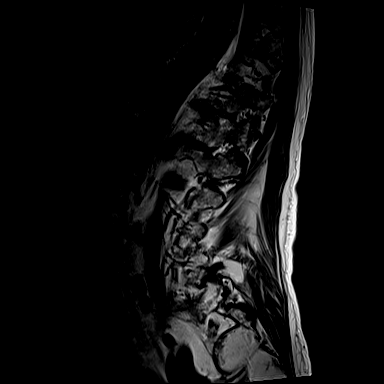

[Series 10: T1 · axial · 4.0mm · 0.35mm/px · z∈[-110,+19]mm · 3 of 36 slices shown (2 of 2)]
[im 6/36]
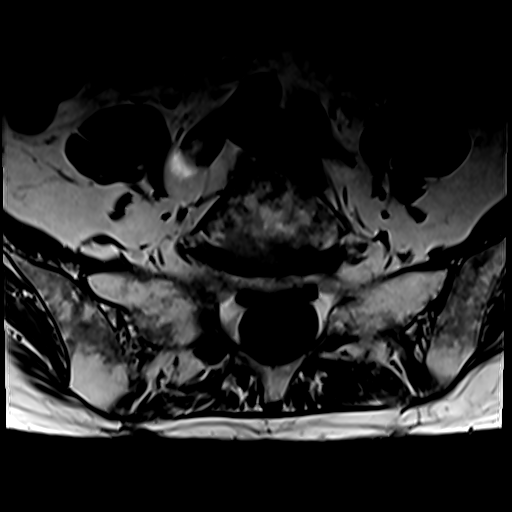
[im 19/36]
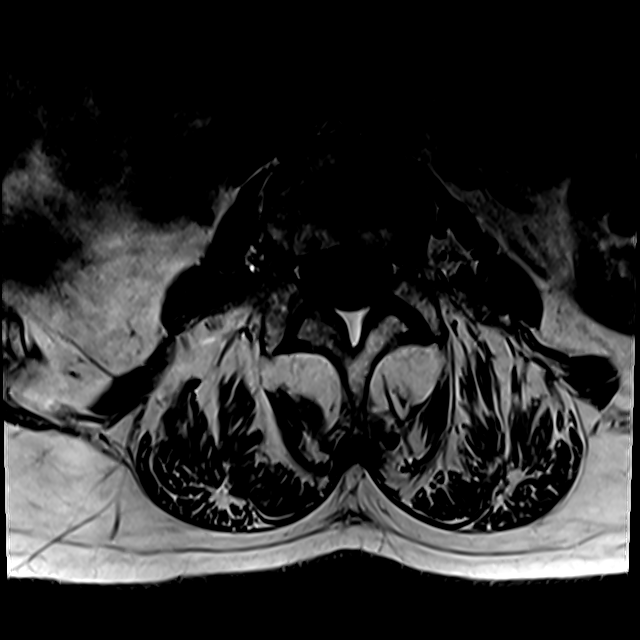
[im 30/36]
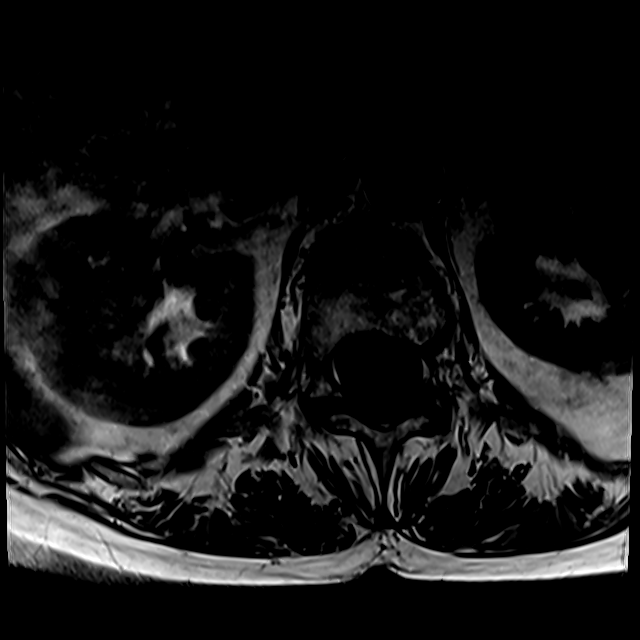

[Series 13: T2 · axial · 4.0mm · 0.35mm/px · z∈[-135,+49]mm · 8 of 36 slices shown (2 of 2)]
[im 1/36]
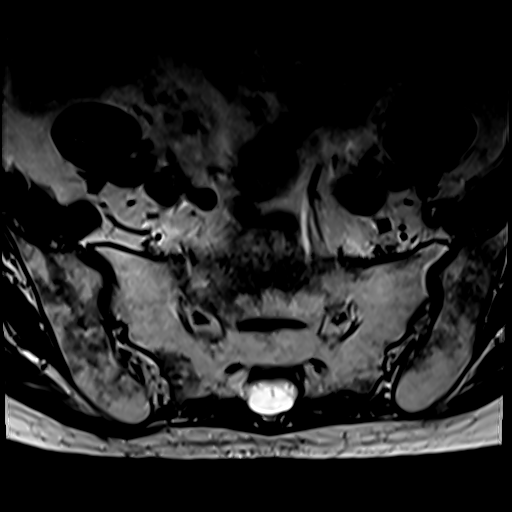
[im 6/36]
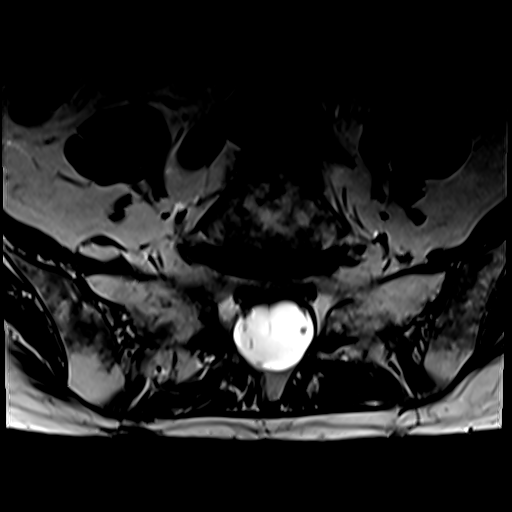
[im 11/36]
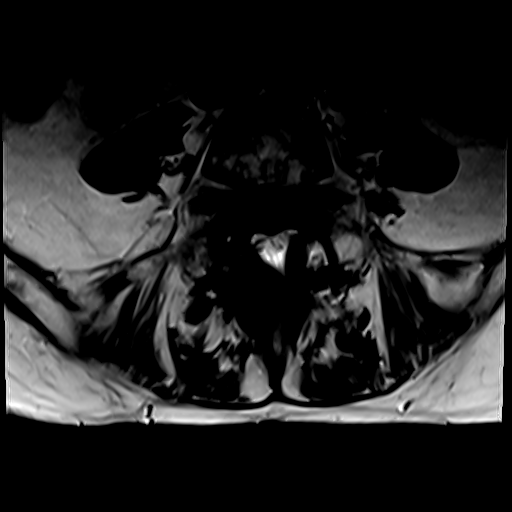
[im 17/36]
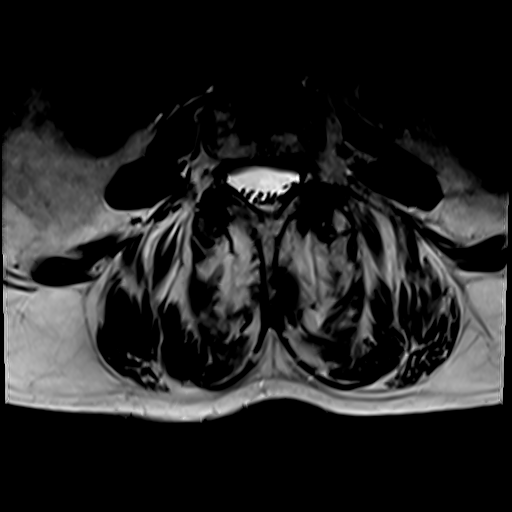
[im 19/36]
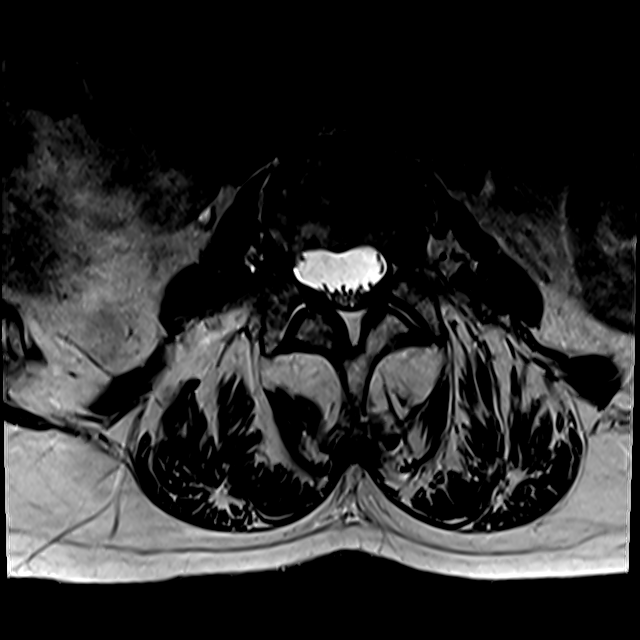
[im 25/36]
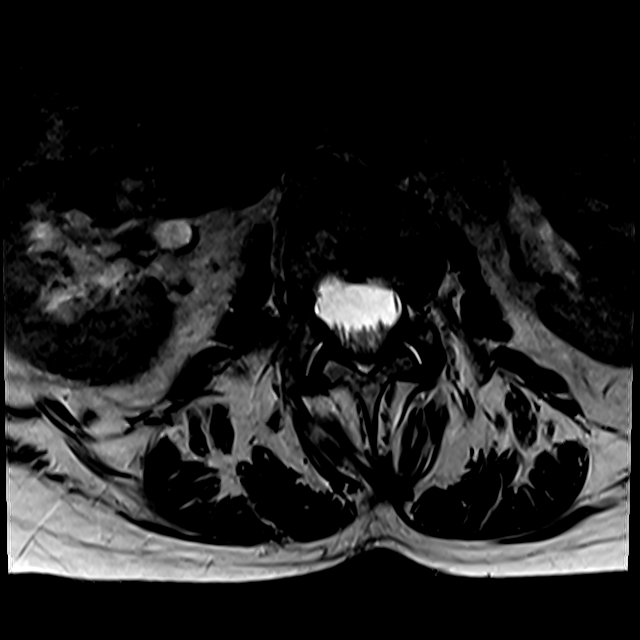
[im 30/36]
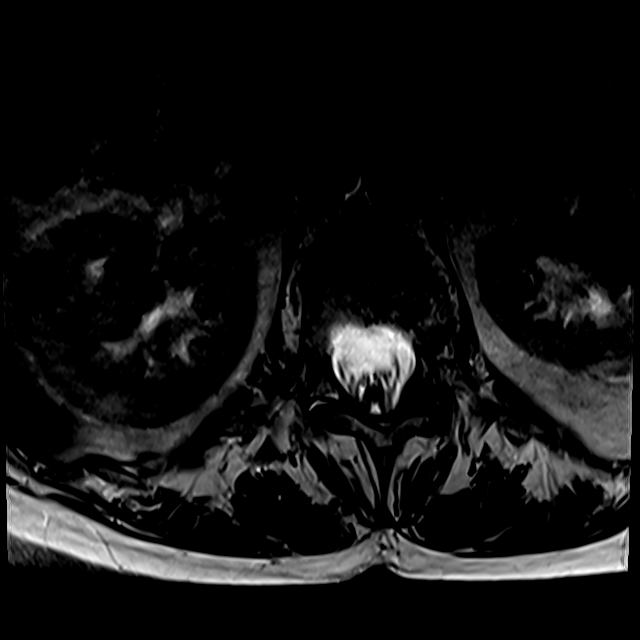
[im 36/36]
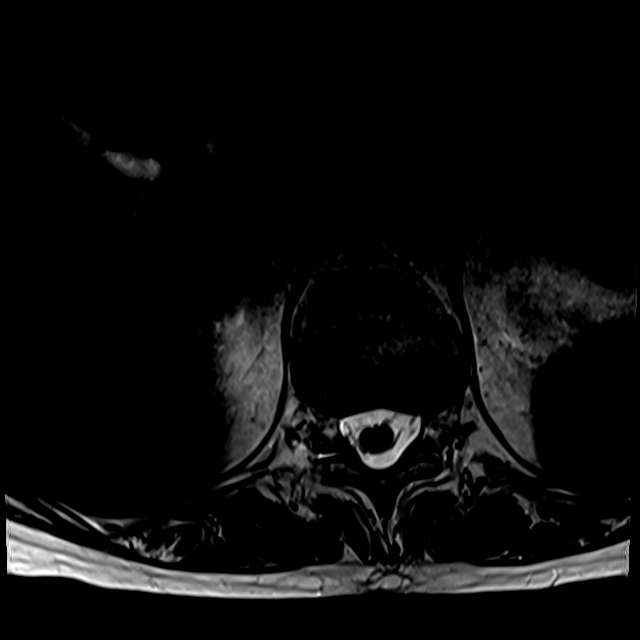

[20 of 48 positions shown; findings below may reference images not displayed]

FINDINGS: Segmentation: Based on the lowest visible ribs there is a
transitional S1 vertebra.

Alignment:  Anterolisthesis at L4-5 to S1-2.

Vertebrae: Discogenic endplate edema at T9-10 to at T12-L1. Marrow
edema about the right S1-2 facet joint.

Conus medullaris and cauda equina: Conus extends to the L2 level.
Conus and cauda equina appear normal.

Paraspinal and other soft tissues: Unremarkable

Disc levels:

T12- L1: Disc collapse and endplate degeneration.  No impingement

L1-L2: Eccentric right disc narrowing and bulging with endplate
ridging. Mild right facet spurring. Moderate or advanced right
foraminal narrowing.

L2-L3: Unremarkable.

L3-L4: Mild disc narrowing and bulging.

L4-L5: Facet osteoarthritis with spurring and anterolisthesis. Mild
disc bulging.

L5-S1:Severe facet osteoarthritis with spurring. Disc narrowing and
bulging of the uncovered disc. Spinal stenosis is moderate. Moderate
right more than left foraminal narrowing

S1-2 facet osteoarthritis with spurring and trace anterolisthesis.
No herniation or impingement
IMPRESSION: 1. Transitional S1 vertebra based on the lowest visible ribs.
2. Advanced lower lumbar facet osteoarthritis with anterolisthesis
at L4-5 to S1-2. Degenerative marrow edema is seen at the right
L5-S1 facet.
3. L5-S1 moderate spinal and right more than left foraminal
stenosis.
4. L1-2 right foraminal impingement.

## 2021-03-24 ENCOUNTER — Encounter: Payer: Self-pay | Admitting: Nurse Practitioner

## 2021-03-24 DIAGNOSIS — Z1231 Encounter for screening mammogram for malignant neoplasm of breast: Secondary | ICD-10-CM | POA: Diagnosis not present

## 2021-03-27 ENCOUNTER — Encounter: Payer: Self-pay | Admitting: Nurse Practitioner

## 2021-03-27 ENCOUNTER — Ambulatory Visit (INDEPENDENT_AMBULATORY_CARE_PROVIDER_SITE_OTHER): Payer: PPO | Admitting: Nurse Practitioner

## 2021-03-27 ENCOUNTER — Other Ambulatory Visit: Payer: Self-pay

## 2021-03-27 VITALS — BP 124/74 | Ht 62.5 in | Wt 135.0 lb

## 2021-03-27 DIAGNOSIS — M8589 Other specified disorders of bone density and structure, multiple sites: Secondary | ICD-10-CM

## 2021-03-27 DIAGNOSIS — Z01419 Encounter for gynecological examination (general) (routine) without abnormal findings: Secondary | ICD-10-CM | POA: Diagnosis not present

## 2021-03-27 DIAGNOSIS — Z90722 Acquired absence of ovaries, bilateral: Secondary | ICD-10-CM

## 2021-03-27 DIAGNOSIS — Z9079 Acquired absence of other genital organ(s): Secondary | ICD-10-CM

## 2021-03-27 DIAGNOSIS — Z90710 Acquired absence of both cervix and uterus: Secondary | ICD-10-CM

## 2021-03-27 NOTE — Patient Instructions (Signed)
Health Maintenance After Age 75 After age 75, you are at a higher risk for certain long-term diseases and infections as well as injuries from falls. Falls are a major cause of broken bones and head injuries in people who are older than age 75. Getting regular preventive care can help to keep you healthy and well. Preventive care includes getting regular testing and making lifestyle changes as recommended by your health care provider. Talk with your health care provider about:  Which screenings and tests you should have. A screening is a test that checks for a disease when you have no symptoms.  A diet and exercise plan that is right for you. What should I know about screenings and tests to prevent falls? Screening and testing are the best ways to find a health problem early. Early diagnosis and treatment give you the best chance of managing medical conditions that are common after age 75. Certain conditions and lifestyle choices may make you more likely to have a fall. Your health care provider may recommend:  Regular vision checks. Poor vision and conditions such as cataracts can make you more likely to have a fall. If you wear glasses, make sure to get your prescription updated if your vision changes.  Medicine review. Work with your health care provider to regularly review all of the medicines you are taking, including over-the-counter medicines. Ask your health care provider about any side effects that may make you more likely to have a fall. Tell your health care provider if any medicines that you take make you feel dizzy or sleepy.  Osteoporosis screening. Osteoporosis is a condition that causes the bones to get weaker. This can make the bones weak and cause them to break more easily.  Blood pressure screening. Blood pressure changes and medicines to control blood pressure can make you feel dizzy.  Strength and balance checks. Your health care provider may recommend certain tests to check your  strength and balance while standing, walking, or changing positions.  Foot health exam. Foot pain and numbness, as well as not wearing proper footwear, can make you more likely to have a fall.  Depression screening. You may be more likely to have a fall if you have a fear of falling, feel emotionally low, or feel unable to do activities that you used to do.  Alcohol use screening. Using too much alcohol can affect your balance and may make you more likely to have a fall. What actions can I take to lower my risk of falls? General instructions  Talk with your health care provider about your risks for falling. Tell your health care provider if: ? You fall. Be sure to tell your health care provider about all falls, even ones that seem minor. ? You feel dizzy, sleepy, or off-balance.  Take over-the-counter and prescription medicines only as told by your health care provider. These include any supplements.  Eat a healthy diet and maintain a healthy weight. A healthy diet includes low-fat dairy products, low-fat (lean) meats, and fiber from whole grains, beans, and lots of fruits and vegetables. Home safety  Remove any tripping hazards, such as rugs, cords, and clutter.  Install safety equipment such as grab bars in bathrooms and safety rails on stairs.  Keep rooms and walkways well-lit. Activity  Follow a regular exercise program to stay fit. This will help you maintain your balance. Ask your health care provider what types of exercise are appropriate for you.  If you need a cane or walker,   use it as recommended by your health care provider.  Wear supportive shoes that have nonskid soles.   Lifestyle  Do not drink alcohol if your health care provider tells you not to drink.  If you drink alcohol, limit how much you have: ? 0-1 drink a day for women. ? 0-2 drinks a day for men.  Be aware of how much alcohol is in your drink. In the [place] one drink equals one typical bottle of beer (12  oz), one-half glass of wine (5 oz), or one shot of hard liquor (1 oz).  Do not use any products that contain nicotine or tobacco, such as cigarettes and e-cigarettes. If you need help quitting, ask your health care provider. Summary  Having a healthy lifestyle and getting preventive care can help to protect your health and wellness after age 75.  Screening and testing are the best way to find a health problem early and help you avoid having a fall. Early diagnosis and treatment give you the best chance for managing medical conditions that are more common for people who are older than age 75.  Falls are a major cause of broken bones and head injuries in people who are older than age 75. Take precautions to prevent a fall at home.  Work with your health care provider to learn what changes you can make to improve your health and wellness and to prevent falls. This information is not intended to replace advice given to you by your health care provider. Make sure you discuss any questions you have with your health care provider. Document Revised: 01/29/2019 Document Reviewed: 08/21/2017 Elsevier Patient Education  2021 Elsevier Inc.  

## 2021-03-27 NOTE — Progress Notes (Signed)
   [name] December 08, 1945 [id number]   History:  75 y.o. G0 presents for breast and pelvic exam. No GYN complaints. 1987 [facility] BSO for endometriosis - no HRT. Normal pap and mammogram history. Osteopenia with elevated FRAX, managed by Dr. Gale Journey at [facility]. HTN, hypothyroidism managed by PCP.   Gynecologic History No LMP recorded. Patient has had a hysterectomy.   Contraception: status post hysterectomy  Health Maintenance Last Pap: No longer screening per guidelines Last mammogram: 03/2021. Results were: normal Last colonoscopy: 2014. Results were: normal Last Dexa: 06/08/2019. Results were: T-score -1.8, FRAX 17% / 3.8%  Past medical history, past surgical history, family history and social history were all reviewed and documented in the EPIC chart. Married. Has horses and dogs.   ROS:  A ROS was performed and pertinent positives and negatives are included.  Exam:  Vitals:   03/27/21 1117  BP: 124/74  Weight: 135 lb (61.2 kg)  Height: 5' 2.5" (1.588 m)   Body mass index is 24.3 kg/m.  General appearance:  Normal Thyroid:  Symmetrical, normal in size, without palpable masses or nodularity. Respiratory  Auscultation:  Clear without wheezing or rhonchi Cardiovascular  Auscultation:  Regular rate, without rubs, murmurs or gallops  Edema/varicosities:  Not grossly evident Abdominal  Soft,nontender, without masses, guarding or rebound.  Liver/spleen:  No organomegaly noted  Hernia:  None appreciated  Skin  Inspection:  Grossly normal Breasts: Examined lying and sitting.   Right: Without masses, retractions, nipple discharge or axillary adenopathy.   Left: Without masses, retractions, nipple discharge or axillary adenopathy. Genitourinary   Inguinal/mons:  Normal without inguinal adenopathy  External genitalia:  Normal appearing vulva with no masses, tenderness, or lesions  BUS/Urethra/Skene's glands:  Normal  Vagina:  Normal appearing with normal color and discharge, no  lesions. Atrophic changes  Cervix:  Absent  Uterus:  Absent  Adnexa/parametria:     Rt: Normal in size, without masses or tenderness.   Lt: Normal in size, without masses or tenderness.  Anus and perineum: Normal  Digital rectal exam: Normal sphincter tone without palpated masses or tenderness  Assessment/Plan:  75 y.o. G0 for breast and pelvic exam.   Well female exam with routine gynecological exam - Education provided on SBEs, importance of preventative screenings, current guidelines, high calcium diet, regular exercise, and multivitamin daily. Labs with PCP.   Osteopenia of multiple sites - Last Dexa 06/08/2019 T-score -1.8, FRAX 17% / 3.8%. Managed by Dr. Gale Journey at [facility]. On HCTZ, calcium and Vitamin D. Very active.  History of total abdominal hysterectomy and bilateral salpingo-oophorectomy - 1987 for endometriosis, no HRT.   Screening for cervical cancer - Normal Pap history. No longer screening per guidelines.   Screening for breast cancer - Normal mammogram history.  Continue annual screenings.  Normal breast exam today.  Screening for colon cancer - 2014 colonoscopy. Will repeat at GI's recommended interval.   Return in 2 years for breast and pelvic exam.    Tamela Gammon DNP, 11:38 AM 03/27/2021

## 2021-04-03 DIAGNOSIS — M5416 Radiculopathy, lumbar region: Secondary | ICD-10-CM | POA: Diagnosis not present

## 2021-04-19 ENCOUNTER — Encounter: Payer: Self-pay | Admitting: *Deleted

## 2021-04-21 ENCOUNTER — Other Ambulatory Visit: Payer: Self-pay

## 2021-04-21 ENCOUNTER — Encounter: Payer: Self-pay | Admitting: Plastic Surgery

## 2021-04-21 ENCOUNTER — Ambulatory Visit (INDEPENDENT_AMBULATORY_CARE_PROVIDER_SITE_OTHER): Payer: Self-pay | Admitting: Plastic Surgery

## 2021-04-21 DIAGNOSIS — Z719 Counseling, unspecified: Secondary | ICD-10-CM

## 2021-04-21 NOTE — Progress Notes (Signed)

## 2021-05-01 ENCOUNTER — Telehealth: Payer: PPO | Admitting: Adult Health

## 2021-05-01 ENCOUNTER — Encounter: Payer: Self-pay | Admitting: Adult Health

## 2021-05-01 ENCOUNTER — Ambulatory Visit: Payer: PPO | Admitting: Adult Health

## 2021-05-01 VITALS — BP 140/78 | HR 78 | Ht 64.5 in | Wt 131.4 lb

## 2021-05-01 DIAGNOSIS — F5104 Psychophysiologic insomnia: Secondary | ICD-10-CM | POA: Diagnosis not present

## 2021-05-01 MED ORDER — TIZANIDINE HCL 4 MG PO TABS: 4.0000 mg | ORAL_TABLET | Freq: Every day | ORAL | 3 refills | Status: DC

## 2021-05-01 MED ORDER — QUETIAPINE FUMARATE 25 MG PO TABS: ORAL_TABLET | ORAL | 3 refills | Status: DC

## 2021-05-01 NOTE — Progress Notes (Signed)
PATIENT: [name] DOB: Nov 22, 1945  REASON FOR VISIT: follow up HISTORY FROM: patient Primary neurologist: Dr. Brett Fairy   HISTORY OF PRESENT ILLNESS: Today 05/01/21:  [name] is a 75 year old female with a history of insomnia.  She returns today for follow-up.  She remains on Seroquel 75 mg at bedtime and tizanidine 4 mg at bedtime.  She reports that this continues to work well for her.  She denies any new issues.  She returns today for an evaluation.  05/02/20: [name] is a 75 year old female with a history of insomnia.  She returns today for follow-up.  She tried trazodone but did not find it beneficial.  She is back on Seroquel and tizanidine.  She reports that the combination works well for her.  She denies any new issues.  She returns today for an evaluation.  HISTORY (Copied from Dr.Dohmeier's note) [name] is a 75 y.o. year old White or Caucasian female patient seen here on 10/26/2019.  I have the pleasure of seeing [name] today, a right -handed White or Caucasian female with a possible sleep disorder.  She has a  has a past medical history of Arthritis, Cataract, Fracture (10/2016), History of hiatal hernia, Hypothyroidism, Insomnia due to anxiety and fear, and Snoring (09/29/2014).Marland Kitchen   HPI:  [name] is a 75 y.o. female  Is seen here as a revisit  from Dr. Birdie Riddle for chronic insomnia, treated with Seroquel .  Her last 4 appointments  were with Cecille Rubin, [facility].  She reports cyclic insomnia, and related to stressful situation. Her father was an alcoholic and air Customer service manager. The patient moved a lot with her family and has had lifelong fear of going to sleep, related to her experiences with an intoxicated father coming home and being belligerent, but she was never physically abused. Marland Kitchen  She was born on [place], her brother in [place] and her sister in [place]. She never lived in a place longer than 4 years. She lives in [place] since 1974.     Patient returns for followup after last visit 12-302019- Cervical spine disease, controlled with regular exercise and massages. She has a history of chronic insomnia and is currently on Seroquel doing well on that medication she continues to exercise daily. She has no new neurologic complaints.    REVIEW OF SYSTEMS: Out of a complete 14 system review of symptoms, the patient complains only of the following symptoms, and all other reviewed systems are negative.  ALLERGIES: Allergies  Allergen Reactions   Codeine Nausea And Vomiting   Formaldehyde Other (See Comments)    Allergy testing, unknown reaction    HOME MEDICATIONS: Outpatient Medications Prior to Visit  Medication Sig Dispense Refill   Biotin 5 MG CAPS Take 5 mg by mouth daily.     Calcium Carbonate-Vitamin D (CALCIUM + D PO) Take 1 tablet by mouth daily.      Cholecalciferol (VITAMIN D PO) Take by mouth.     diclofenac Sodium (VOLTAREN) 1 % GEL Apply topically 4 (four) times daily.     Evening Primrose Oil CAPS Take 2 capsules by mouth daily.     GLUCOSAMINE PO Take 3,000 mg by mouth.      levothyroxine (SYNTHROID) 50 MCG tablet Take 1 tablet (50 mcg total) by mouth daily before breakfast. 90 tablet 1   MILK THISTLE PO Take by mouth.     Multiple Vitamin (MULTIVITAMIN) capsule Take 1 capsule by mouth daily.  Omega-3 Fatty Acids (OMEGA 3 PO) Take 3,600 mg by mouth.      QUEtiapine (SEROQUEL) 25 MG tablet TAKE 3 TABLETS BY MOUTH AT BEDTIME 270 tablet 3   tiZANidine (ZANAFLEX) 4 MG tablet TAKE 1 TABLET(4 MG) BY MOUTH AT BEDTIME 90 tablet 3   triamterene-hydrochlorothiazide (DYAZIDE) 37.5-25 MG capsule Take 1 each (1 capsule total) by mouth every morning. 90 capsule 4   No facility-administered medications prior to visit.    PAST MEDICAL HISTORY: Past Medical History:  Diagnosis Date   Arthritis    Dr Estanislado Pandy   Cataract    Fracture 10/2016   left ankel   History of hiatal hernia    found on endoscopy    Hypothyroidism    Insomnia due to anxiety and fear    Snoring 09/29/2014    PAST SURGICAL HISTORY: Past Surgical History:  Procedure Laterality Date   ABDOMINAL HYSTERECTOMY  1987   [facility],BSO, APPENDECTOMY   APPENDECTOMY  1987   APPENDECTOMY AT [facility],BSO   CATARACT EXTRACTION, BILATERAL Bilateral    20 plus years ago   COLONOSCOPY      X 3; [facility]. All negative   KNEE ARTHROSCOPY Bilateral    METATARSAL OSTEOTOMY WITH BUNIONECTOMY Left 10/31/2017   Procedure: Left First Metatarsal Scarf, Modified McBride and Akin Osteotomies;  Surgeon: Wylene Simmer, MD;  Location: [facility];  Service: Orthopedics;  Laterality: Left;   REPLACEMENT TOTAL KNEE Bilateral    X 1 each; Dr Morene Rankins CUFF REPAIR Right 2010    FAMILY HISTORY: Family History  Problem Relation Age of Onset   Ovarian cancer Mother 22   Alcohol abuse Father    Cirrhosis Father    Stroke Sister 63   Diabetes Paternal Grandmother    Heart disease Neg Hx    Hypertension Neg Hx    Hyperlipidemia Neg Hx    Colon cancer Neg Hx     SOCIAL HISTORY: Social History   Socioeconomic History   Marital status: Married    Spouse name: Not on file   Number of children: 0   Years of education: Not on file   Highest education level: Not on file  Occupational History   Occupation: retired    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 Birdie Riddle    Social Determinants of Health   Financial Resource Strain: Not on  file  Food Insecurity: No Food Insecurity   Worried About Running Out of Food in the Last Year: Never true   Ran Out of Food in the Last Year: Never true  Transportation Needs: Not on file  Physical Activity: Not on file  Stress: Not on file  Social Connections: Not on file  Intimate Partner Violence: Not on file      PHYSICAL EXAM  Vitals:   05/01/21 1304  BP: 140/78  Pulse: 78  Weight: 131 lb 6.4 oz (59.6 kg)  Height: 5' 4.5" (1.638 m)   Body mass index is 22.21 kg/m.  Generalized: Well developed, in no acute distress   Neurological examination  Mentation: Alert oriented to time, place, history taking. Follows all commands speech and language fluent Cranial nerve II-XII: Pupils were equal round reactive to light. Extraocular movements were full, visual field were full on confrontational test. . Head turning and shoulder shrug  were normal and symmetric. Motor: The motor testing reveals 5 over 5 strength of all 4 extremities. Good symmetric motor tone is noted throughout.  Sensory: Sensory testing is intact to soft touch on all 4 extremities. No evidence of extinction is noted.  Coordination: Cerebellar testing reveals good finger-nose-finger and heel-to-shin bilaterally.  Gait and station: Gait is normal.   DIAGNOSTIC DATA (LABS, IMAGING, TESTING) - I reviewed patient records, labs, notes, testing and imaging myself where available.  Lab Results  Component Value Date   WBC 8.4 12/01/2020   HGB 14.9 12/01/2020   HCT 43.6 12/01/2020   MCV 98.3 12/01/2020   PLT 215.0 12/01/2020      Component Value Date/Time   NA 137 12/01/2020 1314   NA 139 09/23/2013 1458   K 3.5 12/01/2020 1314   CL 98 12/01/2020 1314   CO2 29 12/01/2020 1314   GLUCOSE 91 12/01/2020 1314   BUN 16 12/01/2020 1314   BUN 25 09/23/2013 1458   CREATININE 0.86 12/01/2020 1314   CALCIUM 10.3 12/01/2020 1314   PROT 7.8 12/01/2020 1314   PROT 7.3 09/23/2013 1458   ALBUMIN 4.9 12/01/2020 1314    ALBUMIN 4.7 09/23/2013 1458   AST 16 12/01/2020 1314   ALT 28 12/01/2020 1314   ALKPHOS 49 12/01/2020 1314   BILITOT 0.8 12/01/2020 1314   GFRNONAA 77 09/23/2013 1458   GFRAA 88 09/23/2013 1458   Lab Results  Component Value Date   CHOL 238 (H) 12/01/2020   HDL 107.60 12/01/2020   LDLCALC 112 (H) 12/01/2020   TRIG 92.0 12/01/2020   CHOLHDL 2 12/01/2020    Lab Results  Component Value Date   TSH 4.02 12/01/2020      ASSESSMENT AND PLAN 75 y.o. year old female  has a past medical history of Arthritis, Cataract, Fracture (10/2016), History of hiatal hernia, Hypothyroidism, Insomnia due to anxiety and fear, and Snoring (09/29/2014). here with:  Insomnia  Continue Seroquel 75 mg at bedtime Continue tizanidine 4 mg at bedtime Advised if symptoms worsen or she develops new symptoms she should let us know Follow-up in 1 year or sooner if needed   Ward Givens, MSN, NP-C 05/01/2021, 1:28 PM [facility] [address] [place], [place] [address] [phone] year old old with a new make-up.

## 2021-05-01 NOTE — Patient Instructions (Signed)
Your Plan:  Continue Seroquel and Tizanidine If your symptoms worsen or you develop new symptoms please let us know.   Thank you for coming to see [place] at [facility]. I hope we have been able to provide you high quality care today.  You may receive a patient satisfaction survey over the next few weeks. We would appreciate your feedback and comments so that we may continue to improve ourselves and the health of our patients.

## 2021-05-19 ENCOUNTER — Other Ambulatory Visit: Payer: Self-pay | Admitting: Family Medicine

## 2021-06-12 DIAGNOSIS — Z780 Asymptomatic menopausal state: Secondary | ICD-10-CM | POA: Diagnosis not present

## 2021-06-12 DIAGNOSIS — M8589 Other specified disorders of bone density and structure, multiple sites: Secondary | ICD-10-CM | POA: Diagnosis not present

## 2021-06-13 ENCOUNTER — Encounter: Payer: Self-pay | Admitting: Gynecology

## 2021-06-19 ENCOUNTER — Ambulatory Visit (INDEPENDENT_AMBULATORY_CARE_PROVIDER_SITE_OTHER): Payer: PPO | Admitting: *Deleted

## 2021-06-19 DIAGNOSIS — Z0000 Encounter for general adult medical examination without abnormal findings: Secondary | ICD-10-CM

## 2021-06-19 NOTE — Progress Notes (Signed)
Subjective:   [name] is a 75 y.o. female who presents for Medicare Annual (Subsequent) preventive examination.  I connected with  Lockie Pares on 06/19/21 by a telephone enabled telemedicine application and verified that I am speaking with the correct person using two identifiers.   I discussed the limitations of evaluation and management by telemedicine. The patient expressed understanding and agreed to proceed.   Review of Systems    NA       Objective:    Today's Vitals   There is no height or weight on file to calculate BMI.  Advanced Directives 10/14/2019 10/31/2017 07/17/2017 09/29/2015  Does Patient Have a Medical Advance Directive? Yes Yes Yes Yes  Type of Advance Directive Living will;Healthcare Power of [place];Living will [place];Living will [place];Living will  Does patient want to make changes to medical advance directive? No - Patient declined - - -  Copy of [place] in Chart? No - copy requested Yes No - copy requested No - copy requested    Current Medications (verified) Outpatient Encounter Medications as of 06/19/2021  Medication Sig   Biotin 5 MG CAPS Take 5 mg by mouth daily.   Calcium Carbonate-Vitamin D (CALCIUM + D PO) Take 1 tablet by mouth daily.    Cholecalciferol (VITAMIN D PO) Take by mouth.   diclofenac Sodium (VOLTAREN) 1 % GEL Apply topically 4 (four) times daily.   Evening Primrose Oil CAPS Take 2 capsules by mouth daily.   GLUCOSAMINE PO Take 3,000 mg by mouth.    levothyroxine (SYNTHROID) 50 MCG tablet Take 1 tablet (50 mcg total) by mouth daily before breakfast.   MILK THISTLE PO Take by mouth.   Multiple Vitamin (MULTIVITAMIN) capsule Take 1 capsule by mouth daily.   Omega-3 Fatty Acids (OMEGA 3 PO) Take 3,600 mg by mouth.    QUEtiapine (SEROQUEL) 25 MG tablet TAKE 3 TABLETS BY MOUTH AT BEDTIME   tiZANidine (ZANAFLEX) 4 MG tablet Take 1  tablet (4 mg total) by mouth at bedtime.   triamterene-hydrochlorothiazide (DYAZIDE) 37.5-25 MG capsule Take 1 each (1 capsule total) by mouth every morning.   No facility-administered encounter medications on file as of 06/19/2021.    Allergies (verified) Codeine and Formaldehyde   History: Past Medical History:  Diagnosis Date   Arthritis    Dr Estanislado Pandy   Cataract    Fracture 10/2016   left ankel   History of hiatal hernia    found on endoscopy   Hypothyroidism    Insomnia due to anxiety and fear    Snoring 09/29/2014   Past Surgical History:  Procedure Laterality Date   ABDOMINAL HYSTERECTOMY  1987   TAH,BSO, APPENDECTOMY   APPENDECTOMY  1987   APPENDECTOMY AT TAH,BSO   CATARACT EXTRACTION, BILATERAL Bilateral    20 plus years ago   COLONOSCOPY      X 3; [facility]. All negative   KNEE ARTHROSCOPY Bilateral    METATARSAL OSTEOTOMY WITH BUNIONECTOMY Left 10/31/2017   Procedure: Left First Metatarsal Scarf, Modified McBride and Akin Osteotomies;  Surgeon: Wylene Simmer, MD;  Location: [facility];  Service: Orthopedics;  Laterality: Left;   REPLACEMENT TOTAL KNEE Bilateral    X 1 each; Dr Morene Rankins CUFF REPAIR Right 2010   Family History  Problem Relation Age of Onset   Ovarian cancer Mother 32   Alcohol abuse Father    Cirrhosis Father  Stroke Sister 46   Diabetes Paternal Grandmother    Heart disease Neg Hx    Hypertension Neg Hx    Hyperlipidemia Neg Hx    Colon cancer Neg Hx    Social History   Socioeconomic History   Marital status: Married    Spouse name: Not on file   Number of children: 0   Years of education: Not on file   Highest education level: Not on file  Occupational History   Occupation: retired    Fish farm manager: NOT EMPLOYED    Comment: Education officer, museum   Tobacco Use   Smoking status: Never   Smokeless tobacco: Never  Vaping Use   Vaping Use: Never used  Substance and Sexual Activity   Alcohol use: Yes     Alcohol/week: 14.0 standard drinks    Types: 14 Standard drinks or equivalent per week    Comment: 1 drink nightly   Drug use: No   Sexual activity: Not Currently    Birth control/protection: Surgical  Other Topics Concern   Not on file  Social History Narrative   No children    2 dogs    3 horses    Husband patient of Dr. Birdie Riddle    Social Determinants of Health   Financial Resource Strain: Low Risk    Difficulty of Paying Living Expenses: Not hard at all  Food Insecurity: No Food Insecurity   Worried About Charity fundraiser in the Last Year: Never true   [place] in the Last Year: Never true  Transportation Needs: No Transportation Needs   Lack of Transportation (Medical): No   Lack of Transportation (Non-Medical): No  Physical Activity: Sufficiently Active   Days of Exercise per Week: 5 days   Minutes of Exercise per Session: 60 min  Stress: No Stress Concern Present   Feeling of Stress : Not at all  Social Connections: Moderately Integrated   Frequency of Communication with Friends and Family: More than three times a week   Frequency of Social Gatherings with Friends and Family: More than three times a week   Attends Religious Services: Never   Marine scientist or Organizations: Yes   Attends Music therapist: More than 4 times per year   Marital Status: Married    Tobacco Counseling Counseling given: Not Answered   Clinical Intake:  Pre-visit preparation completed: Yes  Pain : No/denies pain     Nutritional Risks: None Diabetes: No  How often do you need to have someone help you when you read instructions, pamphlets, or other written materials from your doctor or pharmacy?: 1 - Never  Diabetic? NO  Interpreter Needed?: No  Information entered by :: Leroy Kennedy LPN   Activities of Daily Living In your present state of health, do you have any difficulty performing the following activities: 06/19/2021 12/01/2020  Hearing? N N   Vision? N N  Difficulty concentrating or making decisions? N N  Walking or climbing stairs? N N  Dressing or bathing? N N  Doing errands, shopping? N N  Preparing Food and eating ? N -  Using the Toilet? N -  In the past six months, have you accidently leaked urine? N -  Do you have problems with loss of bowel control? N -  Managing your Medications? N -  Managing your Finances? N -  Housekeeping or managing your Housekeeping? N -  Some recent data might be hidden    Patient Care Team: Birdie Riddle,  Aundra Millet, MD as PCP - General (Family Medicine) Huel Cote, NP (Inactive) as Nurse Practitioner (Obstetrics and Gynecology) Dennie Bible, NP as Nurse Practitioner (Family Medicine) Dohmeier, Asencion Partridge, MD as Consulting Physician (Neurology) Gale Journey, Gwen Her, MD as Referring Physician (Endocrinology) Kelton Pillar, MD as Referring Physician (Orthopedic Surgery) Harriett Sine, MD as Consulting Physician (Dermatology) [place], Heather, [place] as Consulting Physician (Optometry) Madelin Rear, [facility] as Pharmacist (Pharmacist) Linton Rump, PT as Physical Therapist (Physical Therapy)  Indicate any recent Medical Services you may have received from other than [facility] providers in the past year (date may be approximate).     Assessment:   This is a routine wellness examination for [employer].  Hearing/Vision screen Hearing Screening - Comments:: No trouble hearing Vision Screening - Comments:: Up to date Dr. [place]  Dietary issues and exercise activities discussed: Current Exercise Habits: Home exercise routine;Structured exercise class   Goals Addressed             This Visit's Progress    Patient Stated       Continue current lifestyle habits       Depression Screen PHQ 2/9 Scores 06/19/2021 12/01/2020 02/26/2020 10/27/2019 10/14/2019 02/23/2019 10/24/2018  PHQ - 2 Score 0 0 0 0 0 0 0  PHQ- 9 Score - 0 0 0 - - 0    Fall Risk Fall Risk  12/01/2020 02/26/2020 10/27/2019 10/14/2019  02/23/2019  Falls in the past year? 0 0 0 0 0  Number falls in past yr: 0 0 0 - 0  Injury with Fall? 0 0 0 0 0  Risk for fall due to : No Fall Risks - - - -  Follow up - Falls evaluation completed Falls evaluation completed Falls evaluation completed;Education provided;Falls prevention discussed -    FALL RISK PREVENTION PERTAINING TO THE HOME:  Any stairs in or around the home? Yes  If so, are there any without handrails? No  Home free of loose throw rugs in walkways, pet beds, electrical cords, etc? Yes  Adequate lighting in your home to reduce risk of falls? Yes   ASSISTIVE DEVICES UTILIZED TO PREVENT FALLS:  Life alert? No  Use of a cane, walker or w/c? No  Grab bars in the bathroom? No  Shower chair or bench in shower? Yes  Elevated toilet seat or a handicapped toilet? No   TIMED UP AND GO:  Was the test performed? No .    Cognitive Function:  Normal cognitive status assessed by direct observation by this Nurse Health Advisor. No abnormalities found.          Immunizations Immunization History  Administered Date(s) Administered   Influenza, High Dose Seasonal PF 07/17/2017, 07/13/2018, 06/10/2019, 08/06/2020   Influenza,inj,Quad PF,6+ Mos 07/11/2016   Influenza-Unspecified 08/12/2014, 07/19/2015   PFIZER Comirnaty(Gray Top)Covid-19 Tri-Sucrose Vaccine 02/13/2021   PFIZER(Purple Top)SARS-COV-2 Vaccination 11/11/2019, 12/02/2019, 06/26/2020   PNEUMOCOCCAL CONJUGATE-20 04/17/2021   Pneumococcal Conjugate-13 08/12/2014   Pneumococcal Polysaccharide-23 09/10/2012, 10/31/2019   Tdap 08/12/2014   Zoster Recombinat (Shingrix) 09/02/2017, 09/27/2019   Zoster, Live 01/18/2012    TDAP status: Up to date  Flu Vaccine status: Up to date  Pneumococcal vaccine status: Up to date  Covid-19 vaccine status: Completed vaccines  Qualifies for Shingles Vaccine? No   Zostavax completed Yes   Shingrix Completed?: Yes  Screening Tests Health Maintenance  Topic Date Due    INFLUENZA VACCINE  05/22/2021   COLONOSCOPY (Pts 45-2yr Insurance coverage will need to be confirmed)  03/11/2023  TETANUS/TDAP  08/12/2024   DEXA SCAN  Completed   COVID-19 Vaccine  Completed   Hepatitis C Screening  Completed   PNA vac Low Risk Adult  Completed   Zoster Vaccines- Shingrix  Completed   HPV VACCINES  Aged Out    Health Maintenance  Health Maintenance Due  Topic Date Due   INFLUENZA VACCINE  05/22/2021    Colorectal cancer screening: Type of screening: Colonoscopy. Completed  . Repeat every 10 years  Mammogram status: Completed  . Repeat every year  Bone Density status: Completed  . Results reflect: Bone density results: OSTEOPENIA. Repeat every 2 years.  Lung Cancer Screening: (Low Dose CT Chest recommended if Age 41-80 years, 30 pack-year currently smoking OR have quit w/in 15years.) does not qualify.   Lung Cancer Screening Referral  Additional Screening:  Hepatitis C Screening: does not qualify; Completed   Vision Screening: Recommended annual ophthalmology exams for early detection of glaucoma and other disorders of the eye. Is the patient up to date with their annual eye exam?  Yes  Who is the provider or what is the name of the office in which the patient attends annual eye exams? Dr. [place] If pt is not established with a provider, would they like to be referred to a provider to establish care? No .   Dental Screening: Recommended annual dental exams for proper oral hygiene  Community Resource Referral / Chronic Care Management: CRR required this visit?  No   CCM required this visit?  No      Plan:     I have personally reviewed and noted the following in the patient's chart:   Medical and social history Use of alcohol, tobacco or illicit drugs  Current medications and supplements including opioid prescriptions.  Functional ability and status Nutritional status Physical activity Advanced directives List of other  physicians Hospitalizations, surgeries, and ER visits in previous 12 months Vitals Screenings to include cognitive, depression, and falls Referrals and appointments  In addition, I have reviewed and discussed with patient certain preventive protocols, quality metrics, and best practice recommendations. A written personalized care plan for preventive services as well as general preventive health recommendations were provided to patient.     Leroy Kennedy, LPN   [phone]   Nurse Notes: na

## 2021-06-19 NOTE — Patient Instructions (Signed)
[name] , Thank you for taking time to come for your Medicare Wellness Visit. I appreciate your ongoing commitment to your health goals. Please review the following plan we discussed and let me know if I can assist you in the future.   Screening recommendations/referrals: Colonoscopy: up to date Mammogram: up to date Bone Density: up to date Recommended yearly ophthalmology/optometry visit for glaucoma screening and checkup Recommended yearly dental visit for hygiene and checkup  Vaccinations: Influenza vaccine: up to date Pneumococcal vaccine: up to date Tdap vaccine: up to date Shingles vaccine: up to date    Advanced directives: Copy requested  Conditions/risks identified: na   Preventive Care 22 Years and Older, Female Preventive care refers to lifestyle choices and visits with your health care provider that can promote health and wellness. What does preventive care include? A yearly physical exam. This is also called an annual well check. Dental exams once or twice a year. Routine eye exams. Ask your health care provider how often you should have your eyes checked. Personal lifestyle choices, including: Daily care of your teeth and gums. Regular physical activity. Eating a healthy diet. Avoiding tobacco and drug use. Limiting alcohol use. Practicing safe sex. Taking low-dose aspirin every day. Taking vitamin and mineral supplements as recommended by your health care provider. What happens during an annual well check? The services and screenings done by your health care provider during your annual well check will depend on your age, overall health, lifestyle risk factors, and family history of disease. Counseling  Your health care provider may ask you questions about your: Alcohol use. Tobacco use. Drug use. Emotional well-being. Home and relationship well-being. Sexual activity. Eating habits. History of falls. Memory and ability to understand  (cognition). Work and work Statistician. Reproductive health. Screening  You may have the following tests or measurements: Height, weight, and BMI. Blood pressure. Lipid and cholesterol levels. These may be checked every 5 years, or more frequently if you are over 35 years old. Skin check. Lung cancer screening. You may have this screening every year starting at age 14 if you have a 30-pack-year history of smoking and currently smoke or have quit within the past 15 years. Fecal occult blood test (FOBT) of the stool. You may have this test every year starting at age 2. Flexible sigmoidoscopy or colonoscopy. You may have a sigmoidoscopy every 5 years or a colonoscopy every 10 years starting at age 54. Hepatitis C blood test. Hepatitis B blood test. Sexually transmitted disease (STD) testing. Diabetes screening. This is done by checking your blood sugar (glucose) after you have not eaten for a while (fasting). You may have this done every 1-3 years. Bone density scan. This is done to screen for osteoporosis. You may have this done starting at age 3. Mammogram. This may be done every 1-2 years. Talk to your health care provider about how often you should have regular mammograms. Talk with your health care provider about your test results, treatment options, and if necessary, the need for more tests. Vaccines  Your health care provider may recommend certain vaccines, such as: Influenza vaccine. This is recommended every year. Tetanus, diphtheria, and acellular pertussis (Tdap, Td) vaccine. You may need a Td booster every 10 years. Zoster vaccine. You may need this after age 58. Pneumococcal 13-valent conjugate (PCV13) vaccine. One dose is recommended after age 23. Pneumococcal polysaccharide (PPSV23) vaccine. One dose is recommended after age 58. Talk to your health care provider about which screenings and vaccines you  need and how often you need them. This information is not intended to  replace advice given to you by your health care provider. Make sure you discuss any questions you have with your health care provider. Document Released: 11/04/2015 Document Revised: 06/27/2016 Document Reviewed: 08/09/2015 Elsevier Interactive Patient Education  2017 [place] Prevention in the Home Falls can cause injuries. They can happen to people of all ages. There are many things you can do to make your home safe and to help prevent falls. What can I do on the outside of my home? Regularly fix the edges of walkways and driveways and fix any cracks. Remove anything that might make you trip as you walk through a door, such as a raised step or threshold. Trim any bushes or trees on the path to your home. Use bright outdoor lighting. Clear any walking paths of anything that might make someone trip, such as rocks or tools. Regularly check to see if handrails are loose or broken. Make sure that both sides of any steps have handrails. Any raised decks and porches should have guardrails on the edges. Have any leaves, snow, or ice cleared regularly. Use sand or salt on walking paths during winter. Clean up any spills in your garage right away. This includes oil or grease spills. What can I do in the bathroom? Use night lights. Install grab bars by the toilet and in the tub and shower. Do not use towel bars as grab bars. Use non-skid mats or decals in the tub or shower. If you need to sit down in the shower, use a plastic, non-slip stool. Keep the floor dry. Clean up any water that spills on the floor as soon as it happens. Remove soap buildup in the tub or shower regularly. Attach bath mats securely with double-sided non-slip rug tape. Do not have throw rugs and other things on the floor that can make you trip. What can I do in the bedroom? Use night lights. Make sure that you have a light by your bed that is easy to reach. Do not use any sheets or blankets that are too big for  your bed. They should not hang down onto the floor. Have a firm chair that has side arms. You can use this for support while you get dressed. Do not have throw rugs and other things on the floor that can make you trip. What can I do in the kitchen? Clean up any spills right away. Avoid walking on wet floors. Keep items that you use a lot in easy-to-reach places. If you need to reach something above you, use a strong step stool that has a grab bar. Keep electrical cords out of the way. Do not use floor polish or wax that makes floors slippery. If you must use wax, use non-skid floor wax. Do not have throw rugs and other things on the floor that can make you trip. What can I do with my stairs? Do not leave any items on the stairs. Make sure that there are handrails on both sides of the stairs and use them. Fix handrails that are broken or loose. Make sure that handrails are as long as the stairways. Check any carpeting to make sure that it is firmly attached to the stairs. Fix any carpet that is loose or worn. Avoid having throw rugs at the top or bottom of the stairs. If you do have throw rugs, attach them to the floor with carpet tape. Make sure that  you have a light switch at the top of the stairs and the bottom of the stairs. If you do not have them, ask someone to add them for you. What else can I do to help prevent falls? Wear shoes that: Do not have high heels. Have rubber bottoms. Are comfortable and fit you well. Are closed at the toe. Do not wear sandals. If you use a stepladder: Make sure that it is fully opened. Do not climb a closed stepladder. Make sure that both sides of the stepladder are locked into place. Ask someone to hold it for you, if possible. Clearly mark and make sure that you can see: Any grab bars or handrails. First and last steps. Where the edge of each step is. Use tools that help you move around (mobility aids) if they are needed. These  include: Canes. Walkers. Scooters. Crutches. Turn on the lights when you go into a dark area. Replace any light bulbs as soon as they burn out. Set up your furniture so you have a clear path. Avoid moving your furniture around. If any of your floors are uneven, fix them. If there are any pets around you, be aware of where they are. Review your medicines with your doctor. Some medicines can make you feel dizzy. This can increase your chance of falling. Ask your doctor what other things that you can do to help prevent falls. This information is not intended to replace advice given to you by your health care provider. Make sure you discuss any questions you have with your health care provider. Document Released: 08/04/2009 Document Revised: 03/15/2016 Document Reviewed: 11/12/2014 Elsevier Interactive Patient Education  2017 [employer].

## 2021-07-13 ENCOUNTER — Telehealth: Payer: PPO

## 2021-07-19 ENCOUNTER — Telehealth: Payer: PPO

## 2021-07-21 ENCOUNTER — Encounter: Payer: PPO | Admitting: Plastic Surgery

## 2021-07-28 ENCOUNTER — Encounter: Payer: Self-pay | Admitting: Plastic Surgery

## 2021-07-28 ENCOUNTER — Ambulatory Visit (INDEPENDENT_AMBULATORY_CARE_PROVIDER_SITE_OTHER): Payer: Self-pay | Admitting: Plastic Surgery

## 2021-07-28 ENCOUNTER — Other Ambulatory Visit: Payer: Self-pay

## 2021-07-28 DIAGNOSIS — Z719 Counseling, unspecified: Secondary | ICD-10-CM

## 2021-07-28 NOTE — Progress Notes (Signed)

## 2021-08-02 ENCOUNTER — Ambulatory Visit (INDEPENDENT_AMBULATORY_CARE_PROVIDER_SITE_OTHER): Payer: PPO

## 2021-08-02 DIAGNOSIS — E039 Hypothyroidism, unspecified: Secondary | ICD-10-CM

## 2021-08-02 DIAGNOSIS — M8580 Other specified disorders of bone density and structure, unspecified site: Secondary | ICD-10-CM

## 2021-08-02 DIAGNOSIS — G4700 Insomnia, unspecified: Secondary | ICD-10-CM

## 2021-08-02 NOTE — Patient Instructions (Addendum)
Ms. Lukic,  Thank you for talking with me today. I have included our care plan/goals in the following pages.   Please review and call me at [phone] with any questions.  Thanks! [name], PharmD, CPP Clinical Pharmacist Practitioner  [phone]  Care Plan : [facility] pharmacy care plan  Updates made by Madelin Rear, [facility] since 08/02/2021 12:00 AM     Problem: [facility] AMB "PATIENT-SPECIFIC PROBLEM"      Long-Range Goal: Patient-Specific Goal   Start Date: 08/02/2021  Expected End Date: 08/02/2022  This Visit's Progress: On track  Priority: High  Note:    Pharmacist Clinical Goal(s):  Patient will contact provider office for questions/concerns as evidenced notation of same in electronic health record through collaboration with PharmD and provider.   Interventions: 1:1 collaboration with Midge Minium, MD regarding development and update of comprehensive plan of care as evidenced by provider attestation and co-signature Inter-disciplinary care team collaboration (see longitudinal plan of care) Comprehensive medication review performed; medication list updated in electronic medical record  Insomnia (Goal: optimize medication safety, ensure good sleep hygiene) -Controlled -Wakes up in middle of the night then goes back to sleep - has done this entire life. Feeling well rested most days, no interference with daily activities. -No daytime naps  -Current treatment: Quetiapine 75 mg once daily before bed -Educated on Benefits of medication for symptom control Reviewed for side effects - no problems noted  -Recommended to continue current medication  Osteopenia (Goal ensure appropriate supplementation) -Controlled -Last DEXA Scan: 05/2021 - osteopenia. Low fall risk  -Very active. Does piliates, works out with trainer 2x/wk -Patient is not a candidate for pharmacologic treatment -Current treatment  Vitamin D3-Calcium supplement once daily -Medications  previously tried: n/a  -Recommend [phone] units of vitamin D daily. Recommend 1200 mg of calcium daily from dietary and supplemental sources. Recommend weight-bearing and muscle strengthening exercises for building and maintaining bone density. -Recommended to continue current medication  Hypothyroidism (Goal: maintain stable tsh) -Controlled -Current treatment  Levothyroxine 50 mcg once daily  -Medications previously tried: n/a  -Recommended to continue current medication Counseled on appropriate use of levothyroxine - takes when waking up in the middle of night - has done this consistently, no fluctuations in TSH. Side effect review - no problems noted     The patient verbalized understanding of instructions provided today and agreed to receive a MyChart copy of patient instruction and/or educational materials. Telephone follow up appointment with pharmacy team member scheduled for: See next appointment with "Care Management Staff" under "What's Next" below.

## 2021-08-02 NOTE — Progress Notes (Signed)
Chronic Care Management Pharmacy Note  08/02/2021 Name:  [name] MRN:  [id number] DOB:  01-18-1946  Summary: No Rx changes  Subjective: [name] is an 75 y.o. year old female who is a primary patient of Tabori, Aundra Millet, MD.  The CCM team was consulted for assistance with disease management and care coordination needs.    Engaged with patient by telephone for follow up visit in response to provider referral for pharmacy case management and/or care coordination services.   Consent to Services:  The patient was given information about Chronic Care Management services, agreed to services, and gave verbal consent prior to initiation of services.  Please see initial visit note for detailed documentation.   Patient Care Team: Midge Minium, MD as PCP - General (Family Medicine) Huel Cote, NP (Inactive) as Nurse Practitioner (Obstetrics and Gynecology) Dennie Bible, NP as Nurse Practitioner (Family Medicine) Dohmeier, Asencion Partridge, MD as Consulting Physician (Neurology) Gale Journey Gwen Her, MD as Referring Physician (Endocrinology) Kelton Pillar, MD as Referring Physician (Orthopedic Surgery) Harriett Sine, MD as Consulting Physician (Dermatology) [place], Heather, [place] as Consulting Physician (Optometry) Madelin Rear, [facility] as Pharmacist (Pharmacist) Linton Rump, PT as Physical Therapist (Physical Therapy)  Hospital visits: None in previous 6 months  Objective:  Lab Results  Component Value Date   CREATININE 0.86 12/01/2020   CREATININE 0.71 10/27/2019   CREATININE 0.71 10/24/2018    No results found for: HGBA1C Last diabetic Eye exam: No results found for: HMDIABEYEEXA  Last diabetic Foot exam: No results found for: HMDIABFOOTEX      Component Value Date/Time   CHOL 238 (H) 12/01/2020 1314   CHOL 265 (H) 10/27/2019 0947   CHOL 235 (H) 10/24/2018 0837   TRIG 92.0 12/01/2020 1314   TRIG 132.0 10/27/2019 0947   TRIG 78.0 10/24/2018 0837    HDL 107.60 12/01/2020 1314   HDL 108.10 10/27/2019 0947   HDL 112.10 10/24/2018 0837   CHOLHDL 2 12/01/2020 1314   VLDL 18.4 12/01/2020 1314   LDLCALC 112 (H) 12/01/2020 1314   LDLCALC 131 (H) 10/27/2019 0947   [place] 108 (H) 10/24/2018 0837    Hepatic Function Latest Ref Rng & Units 12/01/2020 10/27/2019 10/24/2018  Total Protein 6.0 - 8.3 g/dL 7.8 7.2 7.1  Albumin 3.5 - 5.2 g/dL 4.9 4.7 4.7  AST 0 - 37 U/L '16 17 11  ' ALT 0 - 35 U/L 28 36(H) 21  Alk Phosphatase 39 - 117 U/L 49 50 57  Total Bilirubin 0.2 - 1.2 mg/dL 0.8 0.8 1.0  Bilirubin, Direct 0.0 - 0.3 mg/dL 0.1 0.2 0.2    Lab Results  Component Value Date/Time   TSH 4.02 12/01/2020 01:14 PM   TSH 4.09 10/27/2019 09:47 AM   FREET4 0.67 06/15/2013 11:20 AM    CBC Latest Ref Rng & Units 12/01/2020 10/27/2019 10/24/2018  WBC 4.0 - 10.5 K/uL 8.4 5.9 6.6  Hemoglobin 12.0 - 15.0 g/dL 14.9 14.7 14.9  Hematocrit 36.0 - 46.0 % 43.6 43.3 43.3  Platelets 150.0 - 400.0 K/uL 215.0 203.0 213.0    Lab Results  Component Value Date/Time   VD25OH 35.19 12/01/2020 01:14 PM   VD25OH 32.47 10/27/2019 09:47 AM    Clinical ASCVD:  The ASCVD Risk score (Arnett DK, et al., 2019) failed to calculate for the following reasons:   The systolic blood pressure is missing   The valid HDL cholesterol range is 20 to 100 mg/dL   Social History   Tobacco Use  Smoking Status Never  Smokeless Tobacco Never   BP Readings from Last 3 Encounters:  05/01/21 140/78  03/27/21 124/74  01/20/21 (!) 152/76   Pulse Readings from Last 3 Encounters:  05/01/21 78  01/20/21 83  12/01/20 86   Wt Readings from Last 3 Encounters:  05/01/21 131 lb 6.4 oz (59.6 kg)  03/27/21 135 lb (61.2 kg)  12/01/20 129 lb (58.5 kg)   BMI Readings from Last 3 Encounters:  05/01/21 22.21 kg/m  03/27/21 24.30 kg/m  12/01/20 22.85 kg/m    Assessment: Review of patient past medical history, allergies, medications, health status, including review of consultants reports,  laboratory and other test data, was performed as part of comprehensive evaluation and provision of chronic care management services.   SDOH:  (Social Determinants of Health) assessments and interventions performed: Yes   CCM Care Plan  Allergies  Allergen Reactions   Codeine Nausea And Vomiting   Formaldehyde Other (See Comments)    Allergy testing, unknown reaction    Medications Reviewed Today     Reviewed by Madelin Rear, [facility] (Pharmacist) on 08/02/21 at 1244  Med List Status: <None>   Medication Order Taking? Sig Documenting Provider Last Dose Status Informant  Biotin 5 MG CAPS [id number] Yes Take 5 mg by mouth daily. [provider] Taking Active   Calcium Carbonate-Vitamin D (CALCIUM + D PO) [id number] Yes Take 1 tablet by mouth daily.  [provider] Taking Active   Cholecalciferol (VITAMIN D PO) [id number] Yes Take by mouth. [provider] Taking Active   diclofenac Sodium (VOLTAREN) 1 % GEL [id number] Yes Apply topically 4 (four) times daily. [provider] Taking Active   Evening Primrose Oil CAPS [id number] Yes Take 2 capsules by mouth daily. [provider] Taking Active   GLUCOSAMINE PO [id number] Yes Take 3,000 mg by mouth.  [provider] Taking Active   levothyroxine (SYNTHROID) 50 MCG tablet [id number] Yes Take 1 tablet (50 mcg total) by mouth daily before breakfast. Midge Minium, MD Taking Active   MILK THISTLE PO [id number] Yes Take by mouth. [provider] Taking Active   Multiple Vitamin (MULTIVITAMIN) capsule [id number] Yes Take 1 capsule by mouth daily. [provider] Taking Active   Omega-3 Fatty Acids (OMEGA 3 PO) [id number] Yes Take 3,600 mg by mouth.  [provider] Taking Active   QUEtiapine (SEROQUEL) 25 MG tablet [id number] Yes TAKE 3 TABLETS BY MOUTH AT BEDTIME Ward Givens, NP Taking Active   tiZANidine (ZANAFLEX) 4 MG tablet [id number] Yes Take 1 tablet (4 mg total) by mouth  at bedtime. Ward Givens, NP Taking Active   triamterene-hydrochlorothiazide (DYAZIDE) 37.5-25 MG capsule [id number] Yes Take 1 each (1 capsule total) by mouth every morning. Huel Cote, NP Taking Active             Patient Active Problem List   Diagnosis Date Noted   Hypothyroid 01/20/2018   Encounter for counseling 10/23/2017   Hypercalciuria 10/23/2017   Snoring 09/29/2014   Nonspecific elevation of levels of transaminase or lactic acid dehydrogenase (LDH) 02/09/2014   Insomnia, persistent 09/23/2013   Insomnia due to anxiety and fear    Chest pain 06/17/2013   GERD (gastroesophageal reflux disease) 05/04/2013   Displacement of cervical intervertebral disc without myelopathy 03/12/2013   Osteopenia    Endometriosis     Immunization History  Administered Date(s) Administered   Fluad Quad(high Dose 65+) 07/16/2021   Influenza, High Dose Seasonal PF 07/17/2017, 07/13/2018, 06/10/2019, 08/06/2020  Influenza,inj,Quad PF,6+ Mos 07/11/2016   Influenza-Unspecified 08/12/2014, 07/19/2015   PFIZER Comirnaty(Gray Top)Covid-19 Tri-Sucrose Vaccine 02/13/2021   PFIZER(Purple Top)SARS-COV-2 Vaccination 11/11/2019, 12/02/2019, 06/26/2020   PNEUMOCOCCAL CONJUGATE-20 04/17/2021   Pfizer Covid-19 Vaccine Bivalent Booster 58yr & up 07/16/2021   Pneumococcal Conjugate-13 08/12/2014   Pneumococcal Polysaccharide-23 09/10/2012, 10/31/2019   Tdap 08/12/2014   Zoster Recombinat (Shingrix) 09/02/2017, 09/27/2019   Zoster, Live 01/18/2012    Conditions to be addressed/monitored: Insomnia hypothyroid GERD Osteopenia   Care Plan : ccm pharmacy care plan  Updates [facility] by [facility]adelin [facility]ear [facility] 08/02/2021 12:00 AM     Problem: Insomnia hypothyroid GERD Osteopenia   Priority: High     Long-Range Goal: disease management   Start Date: 08/02/2021  Expected End Date: 08/02/2022  This Visit's Progress: On track  Priority: High  Note:    Pharmacist Clinical Goal(s):  Patient will  contact provider office for questions/concerns as evidenced notation of same in electronic health record through collaboration with PharmD and provider.   Interventions: 1:1 collaboration with TMidge Minium MD regarding development and update of comprehensive plan of care as evidenced by provider attestation and co-signature Inter-disciplinary care team collaboration (see longitudinal plan of care) Comprehensive medication review performed; medication list updated in electronic medical record  Insomnia (Goal: optimize medication safety, ensure good sleep hygiene) -Controlled -Wakes up in middle of the night then goes back to sleep - has done this entire life. Feeling well rested most days, no interference with daily activities. -No daytime naps  -Current treatment: Quetiapine 75 mg once daily before bed -Educated on Benefits of medication for symptom control Reviewed for side effects - no problems noted  -Recommended to continue current medication  Osteopenia (Goal ensure appropriate supplementation) -Controlled -Last DEXA Scan: 05/2021 - osteopenia. Low fall risk  -Very active. Does piliates, works out with trainer 2x/wk -Patient is not a candidate for pharmacologic treatment -Current treatment  Vitamin D3-Calcium supplement once daily -Medications previously tried: n/a  -Recommend [phone] units of vitamin D daily. Recommend 1200 mg of calcium daily from dietary and supplemental sources. Recommend weight-bearing and muscle strengthening exercises for building and maintaining bone density. -Recommended to continue current medication  Hypothyroidism (Goal: maintain stable tsh) -Controlled -Current treatment  Levothyroxine 50 mcg once daily  -Medications previously tried: n/a  -Recommended to continue current medication Counseled on appropriate use of levothyroxine - takes when waking up in the middle of night - has done this consistently, no fluctuations in TSH. Side effect  review - no problems noted     Patient Goals/Self-Care Activities Patient will:  - take medications as prescribed  Medication Assistance: None required.  Patient affirms current coverage meets needs.  [id number] [place] [address] [address]Phone: [phone]: [phone]  Pt endorses 100% compliance  Follow Up:  Patient agrees to Care Plan and Follow-up. Plan: HC 6 month gen. Pharmacist 8 month f/u telephone visit  Future Appointments  Date Time Provider [place] 09/01/2021  2:30 PM Dillingham, CLoel Lofty DO PSS-PSS None  04/04/2022  3:00 PM [facility] CCM PHARMACIST [facility] PEC  05/01/2022  1:30 PM Dohmeier, CAsencion Partridge MD GNA-GNA None    JMadelin Rear PharmD, CPP Clinical Pharmacist Practitioner  [facility] [place] ([phone]

## 2021-08-08 ENCOUNTER — Telehealth: Payer: Self-pay

## 2021-08-08 NOTE — Progress Notes (Signed)
    Chronic Care Management Pharmacy Assistant   Name: [name]  MRN: [id number] DOB: 09-Nov-1945   Reason for Encounter: Disease State - General Adherence Call    Recent office visits:  None noted.   Recent consult visits:  None noted.   Hospital visits:  None in previous 6 months  Medications: Outpatient Encounter Medications as of 08/08/2021  Medication Sig   Biotin 5 MG CAPS Take 5 mg by mouth daily.   Calcium Carbonate-Vitamin D (CALCIUM + D PO) Take 1 tablet by mouth daily.    Cholecalciferol (VITAMIN D PO) Take by mouth.   diclofenac Sodium (VOLTAREN) 1 % GEL Apply topically 4 (four) times daily.   Evening Primrose Oil CAPS Take 2 capsules by mouth daily.   GLUCOSAMINE PO Take 3,000 mg by mouth.    levothyroxine (SYNTHROID) 50 MCG tablet Take 1 tablet (50 mcg total) by mouth daily before breakfast.   MILK THISTLE PO Take by mouth.   Multiple Vitamin (MULTIVITAMIN) capsule Take 1 capsule by mouth daily.   Omega-3 Fatty Acids (OMEGA 3 PO) Take 3,600 mg by mouth.    QUEtiapine (SEROQUEL) 25 MG tablet TAKE 3 TABLETS BY MOUTH AT BEDTIME   tiZANidine (ZANAFLEX) 4 MG tablet Take 1 tablet (4 mg total) by mouth at bedtime.   triamterene-hydrochlorothiazide (DYAZIDE) 37.5-25 MG capsule Take 1 each (1 capsule total) by mouth every morning.   No facility-administered encounter medications on file as of 08/08/2021.    Have you had any problems recently with your health?   Have you had any problems with your pharmacy?   What issues or side effects are you having with your medications?   What would you like me to pass along to Madelin Rear, CPP for them to help you with?    What can we do to take care of you better?   Care Gaps  AWV:  done 03/27/21 Colonoscopy: done 03/10/13 DM Eye Exam: N/A DM Foot Exam: N/A Microalbumin: N/A HbgAIC: N/A DEXA: done 06/12/21 Mammogram: done 03/24/21   Star Rating Drugs: No Star Rating Drugs Noted.    Future Appointments   Date Time Provider [place]  09/01/2021 11:30 AM Dillingham, Loel Lofty, DO PSS-PSS None  04/04/2022  3:00 PM [facility] CCM PHARMACIST [facility] PEC  05/01/2022  1:30 PM Dohmeier, Asencion Partridge, MD GNA-GNA None   Multiple attempts were made to contact patient. Attempts were unsuccessful. / ls,CMA   Jobe Gibbon, [place] Pharmacist Assistant  [phone]  Time Spent: 20 minutes

## 2021-08-11 DIAGNOSIS — L821 Other seborrheic keratosis: Secondary | ICD-10-CM | POA: Diagnosis not present

## 2021-08-11 DIAGNOSIS — L570 Actinic keratosis: Secondary | ICD-10-CM | POA: Diagnosis not present

## 2021-08-11 DIAGNOSIS — L853 Xerosis cutis: Secondary | ICD-10-CM | POA: Diagnosis not present

## 2021-08-11 DIAGNOSIS — L308 Other specified dermatitis: Secondary | ICD-10-CM | POA: Diagnosis not present

## 2021-08-11 DIAGNOSIS — L218 Other seborrheic dermatitis: Secondary | ICD-10-CM | POA: Diagnosis not present

## 2021-08-21 DIAGNOSIS — E039 Hypothyroidism, unspecified: Secondary | ICD-10-CM | POA: Diagnosis not present

## 2021-09-01 ENCOUNTER — Ambulatory Visit (INDEPENDENT_AMBULATORY_CARE_PROVIDER_SITE_OTHER): Payer: PPO | Admitting: Plastic Surgery

## 2021-09-01 ENCOUNTER — Encounter: Payer: Self-pay | Admitting: Plastic Surgery

## 2021-09-01 ENCOUNTER — Other Ambulatory Visit: Payer: PPO | Admitting: Plastic Surgery

## 2021-09-01 ENCOUNTER — Other Ambulatory Visit: Payer: Self-pay

## 2021-09-01 DIAGNOSIS — Z719 Counseling, unspecified: Secondary | ICD-10-CM

## 2021-09-01 NOTE — Progress Notes (Signed)
[place]  Preoperative Dx: Hyperpigmentation of face  Postoperative Dx:  same  Procedure: laser to face  Anesthesia: Lidocaine cream  Description of Procedure:  Risks and complications were explained to the patient. Consent was confirmed and signed. Time out was called and all information was confirmed to be correct. The area  area was prepped with alcohol and wiped dry. The BBL laser was set at 5 and 6 J/cm2 with a 560 nm and 2 Hz. The face was lasered.  The handpiece was then switched to 515 nm at 5 J and 2 Hz.  The face was lasered.  The patient tolerated the procedure well and there were no complications. The patient is to follow up in 4 weeks. This was a trial to see if the patient could tolerate it well.

## 2021-09-19 DIAGNOSIS — M8589 Other specified disorders of bone density and structure, multiple sites: Secondary | ICD-10-CM | POA: Diagnosis not present

## 2021-10-30 DIAGNOSIS — M25561 Pain in right knee: Secondary | ICD-10-CM | POA: Diagnosis not present

## 2021-10-30 DIAGNOSIS — M25562 Pain in left knee: Secondary | ICD-10-CM | POA: Diagnosis not present

## 2021-11-06 ENCOUNTER — Ambulatory Visit (INDEPENDENT_AMBULATORY_CARE_PROVIDER_SITE_OTHER): Payer: Self-pay | Admitting: Plastic Surgery

## 2021-11-06 ENCOUNTER — Encounter: Payer: Self-pay | Admitting: Plastic Surgery

## 2021-11-06 ENCOUNTER — Other Ambulatory Visit: Payer: Self-pay

## 2021-11-06 DIAGNOSIS — Z719 Counseling, unspecified: Secondary | ICD-10-CM

## 2021-11-06 NOTE — Progress Notes (Signed)

## 2021-11-17 ENCOUNTER — Telehealth: Payer: Self-pay | Admitting: Family Medicine

## 2021-11-17 ENCOUNTER — Other Ambulatory Visit: Payer: Self-pay

## 2021-11-17 MED ORDER — LEVOTHYROXINE SODIUM 50 MCG PO TABS: 50.0000 ug | ORAL_TABLET | Freq: Every day | ORAL | 1 refills | Status: DC

## 2021-11-17 NOTE — Telephone Encounter (Signed)
Patient aware the medication has been sent in

## 2021-11-17 NOTE — Telephone Encounter (Signed)
Called pt and informed

## 2021-11-17 NOTE — Telephone Encounter (Signed)
Pt called in asking for a refill on the levothyroxine, she states the pharmacist told her that they have been waiting for a week for the refill. Please advise pt uses [facility] in [place]

## 2021-11-17 NOTE — Telephone Encounter (Signed)
Sent!

## 2022-01-05 ENCOUNTER — Other Ambulatory Visit: Payer: Self-pay | Admitting: Neurology

## 2022-01-13 ENCOUNTER — Emergency Department (HOSPITAL_COMMUNITY): Payer: PPO

## 2022-01-13 ENCOUNTER — Other Ambulatory Visit: Payer: Self-pay

## 2022-01-13 ENCOUNTER — Observation Stay (HOSPITAL_COMMUNITY)
Admission: EM | Admit: 2022-01-13 | Discharge: 2022-01-16 | Disposition: A | Discharge status: Home Health | Payer: PPO | Attending: Internal Medicine | Admitting: Internal Medicine

## 2022-01-13 ENCOUNTER — Encounter (HOSPITAL_COMMUNITY): Payer: Self-pay

## 2022-01-13 DIAGNOSIS — E039 Hypothyroidism, unspecified: Secondary | ICD-10-CM | POA: Diagnosis not present

## 2022-01-13 DIAGNOSIS — S79911A Unspecified injury of right hip, initial encounter: Secondary | ICD-10-CM | POA: Diagnosis present

## 2022-01-13 DIAGNOSIS — D696 Thrombocytopenia, unspecified: Secondary | ICD-10-CM | POA: Insufficient documentation

## 2022-01-13 DIAGNOSIS — D649 Anemia, unspecified: Secondary | ICD-10-CM | POA: Insufficient documentation

## 2022-01-13 DIAGNOSIS — E876 Hypokalemia: Secondary | ICD-10-CM

## 2022-01-13 DIAGNOSIS — S3282XA Multiple fractures of pelvis without disruption of pelvic ring, initial encounter for closed fracture: Principal | ICD-10-CM | POA: Insufficient documentation

## 2022-01-13 DIAGNOSIS — W19XXXA Unspecified fall, initial encounter: Secondary | ICD-10-CM | POA: Diagnosis not present

## 2022-01-13 DIAGNOSIS — M47816 Spondylosis without myelopathy or radiculopathy, lumbar region: Secondary | ICD-10-CM | POA: Diagnosis not present

## 2022-01-13 DIAGNOSIS — M87051 Idiopathic aseptic necrosis of right femur: Secondary | ICD-10-CM | POA: Insufficient documentation

## 2022-01-13 DIAGNOSIS — S32591A Other specified fracture of right pubis, initial encounter for closed fracture: Secondary | ICD-10-CM | POA: Diagnosis not present

## 2022-01-13 DIAGNOSIS — S32511A Fracture of superior rim of right pubis, initial encounter for closed fracture: Secondary | ICD-10-CM | POA: Diagnosis not present

## 2022-01-13 DIAGNOSIS — Z90710 Acquired absence of both cervix and uterus: Secondary | ICD-10-CM | POA: Diagnosis not present

## 2022-01-13 DIAGNOSIS — M4316 Spondylolisthesis, lumbar region: Secondary | ICD-10-CM | POA: Diagnosis not present

## 2022-01-13 DIAGNOSIS — S32599A Other specified fracture of unspecified pubis, initial encounter for closed fracture: Secondary | ICD-10-CM | POA: Diagnosis present

## 2022-01-13 DIAGNOSIS — M25551 Pain in right hip: Secondary | ICD-10-CM | POA: Diagnosis not present

## 2022-01-13 DIAGNOSIS — M25572 Pain in left ankle and joints of left foot: Secondary | ICD-10-CM | POA: Diagnosis not present

## 2022-01-13 LAB — CBC WITH DIFFERENTIAL/PLATELET
Abs Immature Granulocytes: 0.02 10*3/uL (ref 0.00–0.07)
Basophils Absolute: 0 10*3/uL (ref 0.0–0.1)
Basophils Relative: 1 %
Eosinophils Absolute: 0 10*3/uL (ref 0.0–0.5)
Eosinophils Relative: 1 %
HCT: 37 % (ref 36.0–46.0)
Hemoglobin: 13.4 g/dL (ref 12.0–15.0)
Immature Granulocytes: 0 %
Lymphocytes Relative: 17 %
Lymphs Abs: 1.1 10*3/uL (ref 0.7–4.0)
MCH: 34.7 pg — ABNORMAL HIGH (ref 26.0–34.0)
MCHC: 36.2 g/dL — ABNORMAL HIGH (ref 30.0–36.0)
MCV: 95.9 fL (ref 80.0–100.0)
Monocytes Absolute: 0.7 10*3/uL (ref 0.1–1.0)
Monocytes Relative: 11 %
Neutro Abs: 4.5 10*3/uL (ref 1.7–7.7)
Neutrophils Relative %: 70 %
Platelets: 168 10*3/uL (ref 150–400)
RBC: 3.86 MIL/uL — ABNORMAL LOW (ref 3.87–5.11)
RDW: 12.3 % (ref 11.5–15.5)
WBC: 6.3 10*3/uL (ref 4.0–10.5)
nRBC: 0 % (ref 0.0–0.2)

## 2022-01-13 LAB — COMPREHENSIVE METABOLIC PANEL
ALT: 28 U/L (ref 0–44)
AST: 15 U/L (ref 15–41)
Albumin: 4.2 g/dL (ref 3.5–5.0)
Alkaline Phosphatase: 41 U/L (ref 38–126)
Anion gap: 10 (ref 5–15)
BUN: 19 mg/dL (ref 8–23)
CO2: 24 mmol/L (ref 22–32)
Calcium: 9.2 mg/dL (ref 8.9–10.3)
Chloride: 101 mmol/L (ref 98–111)
Creatinine, Ser: 0.65 mg/dL (ref 0.44–1.00)
GFR, Estimated: >60 mL/min (ref >60)
Glucose, Bld: 94 mg/dL (ref 70–99)
Potassium: 3.4 mmol/L — ABNORMAL LOW (ref 3.5–5.1)
Sodium: 135 mmol/L (ref 135–145)
Total Bilirubin: 1 mg/dL (ref 0.3–1.2)
Total Protein: 6.8 g/dL (ref 6.5–8.1)

## 2022-01-13 MED ORDER — HYDROCODONE-ACETAMINOPHEN 5-325 MG PO TABS
1.0000 | ORAL_TABLET | ORAL | Status: DC | PRN
  Administered 2022-01-13: 1 via ORAL
  Administered 2022-01-13 – 2022-01-14 (×2): 2 via ORAL
  Administered 2022-01-14 – 2022-01-15 (×3): 1 via ORAL
  Administered 2022-01-15 – 2022-01-16 (×3): 2 via ORAL
  Filled 2022-01-13 (×2): qty 1
  Filled 2022-01-13: qty 2
  Filled 2022-01-13: qty 1
  Filled 2022-01-13 (×3): qty 2
  Filled 2022-01-13: qty 1
  Filled 2022-01-13: qty 2

## 2022-01-13 MED ORDER — LEVOTHYROXINE SODIUM 50 MCG PO TABS
50.0000 ug | ORAL_TABLET | Freq: Every day | ORAL | Status: DC
  Administered 2022-01-14 – 2022-01-16 (×3): 50 ug via ORAL
  Filled 2022-01-13 (×3): qty 1

## 2022-01-13 MED ORDER — ACETAMINOPHEN 650 MG RE SUPP: 650.0000 mg | Freq: Four times a day (QID) | RECTAL | Status: DC | PRN

## 2022-01-13 MED ORDER — QUETIAPINE FUMARATE 50 MG PO TABS
75.0000 mg | ORAL_TABLET | Freq: Every day | ORAL | Status: DC
  Administered 2022-01-13 – 2022-01-15 (×3): 75 mg via ORAL
  Filled 2022-01-13 (×3): qty 1

## 2022-01-13 MED ORDER — TIZANIDINE HCL 4 MG PO TABS
4.0000 mg | ORAL_TABLET | Freq: Every day | ORAL | Status: DC
  Administered 2022-01-13 – 2022-01-15 (×3): 4 mg via ORAL
  Filled 2022-01-13 (×3): qty 1

## 2022-01-13 MED ORDER — ACETAMINOPHEN 325 MG PO TABS: 650.0000 mg | ORAL_TABLET | Freq: Four times a day (QID) | ORAL | Status: DC | PRN

## 2022-01-13 MED ORDER — ENOXAPARIN SODIUM 40 MG/0.4ML IJ SOSY: 40.0000 mg | PREFILLED_SYRINGE | INTRAMUSCULAR | Status: DC

## 2022-01-13 MED ORDER — KETOROLAC TROMETHAMINE 60 MG/2ML IM SOLN
60.0000 mg | Freq: Once | INTRAMUSCULAR | Status: AC
Start: 2022-01-13 — End: 2022-01-13
  Administered 2022-01-13: 60 mg via INTRAMUSCULAR
  Filled 2022-01-13: qty 2

## 2022-01-13 MED ORDER — MORPHINE SULFATE (PF) 2 MG/ML IV SOLN: 2.0000 mg | INTRAVENOUS | Status: DC | PRN

## 2022-01-13 MED ORDER — ONDANSETRON HCL 4 MG PO TABS: 4.0000 mg | ORAL_TABLET | Freq: Four times a day (QID) | ORAL | Status: DC | PRN

## 2022-01-13 MED ORDER — ONDANSETRON HCL 4 MG/2ML IJ SOLN
4.0000 mg | Freq: Four times a day (QID) | INTRAMUSCULAR | Status: DC | PRN
Start: 2022-01-13 — End: 2022-01-16

## 2022-01-13 MED ORDER — SENNOSIDES-DOCUSATE SODIUM 8.6-50 MG PO TABS: 1.0000 | ORAL_TABLET | Freq: Every evening | ORAL | Status: DC | PRN

## 2022-01-13 MED ORDER — HYDROCODONE-ACETAMINOPHEN 5-325 MG PO TABS
1.0000 | ORAL_TABLET | Freq: Once | ORAL | Status: AC
  Administered 2022-01-13: 1 via ORAL
  Filled 2022-01-13: qty 1

## 2022-01-13 MED ORDER — HYDRALAZINE HCL 20 MG/ML IJ SOLN: 5.0000 mg | INTRAMUSCULAR | Status: DC | PRN

## 2022-01-13 MED ORDER — ENOXAPARIN SODIUM 40 MG/0.4ML IJ SOSY
40.0000 mg | PREFILLED_SYRINGE | INTRAMUSCULAR | Status: DC
  Administered 2022-01-13 – 2022-01-15 (×3): 40 mg via SUBCUTANEOUS
  Filled 2022-01-13 (×3): qty 0.4

## 2022-01-13 NOTE — ED Provider Notes (Signed)
?[facility] DEPT ?Provider Note ? ? ?CSN: [id number] ?Arrival date & time: 01/13/22  6659 ? ?  ? ?History ? ?No chief complaint on file. ? ? ?[name] is a 76 y.o. female. ? ?HPI ? ?  ? ? ?76 year old female with a history of arthritis, hypothyroidism, presents with concern for fall last night with right hip pain. ? ?Had a mechanical fall last night onto right hip. No head trauma, no LOC, no headache, no anticoagulation, no neck pain no back pain, no numbness/weakness, chest pain, abd pain. Went to sleep and woke up with difficulty walking with right sided pain.  Reports pain severe with attempt to ambulate, has not been able to walk today.  No fever or other acute medical concerns.   ? ? ? ?Past Medical History:  ?Diagnosis Date  ? Arthritis   ? Dr Estanislado Pandy  ? Cataract   ? Fracture 10/2016  ? left ankel  ? History of hiatal hernia   ? found on endoscopy  ? Hypothyroidism   ? Insomnia due to anxiety and fear   ? Snoring 09/29/2014  ?  ? ?Home Medications ?Prior to Admission medications   ?Medication Sig Start Date End Date Taking? Authorizing Provider  ?ALEVE 220 MG tablet Take 220-440 mg by mouth 2 (two) times daily as needed (for pain).   Yes [provider]  ?Biotin 5 MG CAPS Take 5 mg by mouth daily.   Yes [provider]  ?Calcium Carb-Cholecalciferol (CALCIUM + D3 PO) Take 1 capsule by mouth 3 (three) times daily.   Yes [provider]  ?diclofenac Sodium (VOLTAREN) 1 % GEL Apply 2 g topically 4 (four) times daily as needed (for soreness or pain).   Yes [provider]  ?Evening Primrose Oil CAPS Take 2 capsules by mouth daily.   Yes [provider]  ?GLUCOSAMINE PO Take 3,000 mg by mouth daily.   Yes [provider]  ?levothyroxine (SYNTHROID) 50 MCG tablet Take 1 tablet (50 mcg total) by mouth daily before breakfast. 11/17/21  Yes Midge Minium, MD  ?MILK THISTLE PO Take 1 tablet by mouth 2 (two) times daily.   Yes  [provider]  ?Multiple Vitamin (MULTIVITAMIN) capsule Take 1 capsule by mouth daily.   Yes [provider]  ?Omega-3 Fatty Acids (OMEGA 3 PO) Take 3,600 mg by mouth daily.   Yes [provider]  ?QUEtiapine (SEROQUEL) 25 MG tablet TAKE 3 TABLETS BY MOUTH AT BEDTIME ?Patient taking differently: Take 75 mg by mouth at bedtime. 05/01/21  Yes Ward Givens, NP  ?tiZANidine (ZANAFLEX) 4 MG tablet TAKE 1 TABLET(4 MG) BY MOUTH AT BEDTIME ?Patient taking differently: Take 4 mg by mouth at bedtime. 01/08/22  Yes Ward Givens, NP  ?triamcinolone cream (KENALOG) 0.1 % Apply 1 application. topically 2 (two) times daily as needed (to eczema flares).   Yes [provider]  ?triamterene-hydrochlorothiazide (DYAZIDE) 37.5-25 MG capsule Take 1 each (1 capsule total) by mouth every morning. 01/30/17  Yes Huel Cote, NP  ?   ? ?Allergies    ?Codeine and Formaldehyde   ? ?Review of Systems   ?Review of Systems ?See above ? ?Physical Exam ?Updated Vital Signs ?BP 135/63 (BP Location: Right Arm)   Pulse 75   Temp 98.3 ?F (36.8 ?C) (Oral)   Resp 14   Ht '5\' 3"'$  (1.6 m)   Wt 56.7 kg   SpO2 97%   BMI 22.14 kg/m?  ?Physical Exam ?Vitals  and nursing note reviewed.  ?Constitutional:   ?   General: She is not in acute distress. ?   Appearance: She is well-developed. She is not diaphoretic.  ?HENT:  ?   Head: Normocephalic and atraumatic.  ?Eyes:  ?   Conjunctiva/sclera: Conjunctivae normal.  ?Cardiovascular:  ?   Rate and Rhythm: Normal rate and regular rhythm.  ?Pulmonary:  ?   Effort: Pulmonary effort is normal. No respiratory distress.  ?Musculoskeletal:     ?   General: Tenderness (right hip, contusion over teh area) present.  ?   Cervical back: Normal range of motion.  ?   Comments: No C/T/L spine tenderness  ?Skin: ?   General: Skin is warm and dry.  ?   Findings: No erythema or rash.  ?Neurological:  ?   Mental Status: She is alert and oriented to person, place, and time.  ? ? ?ED  Results / Procedures / Treatments   ?Labs ?(all labs ordered are listed, but only abnormal results are displayed) ?Labs Reviewed  ?CBC WITH DIFFERENTIAL/PLATELET - Abnormal; Notable for the following components:  ?    Result Value  ? RBC 3.86 (*)   ? MCH 34.7 (*)   ? MCHC 36.2 (*)   ? All other components within normal limits  ?COMPREHENSIVE METABOLIC PANEL - Abnormal; Notable for the following components:  ? Potassium 3.4 (*)   ? All other components within normal limits  ?CBC  ?COMPREHENSIVE METABOLIC PANEL  ?MAGNESIUM  ? ? ?EKG ?None ? ?Radiology ?CT PELVIS WO CONTRAST ? ?Result Date: 01/13/2022 ?CLINICAL DATA:  Post fall with persistent right-sided hip pain. Evaluate for fracture. EXAM: CT PELVIS WITHOUT CONTRAST TECHNIQUE: Multidetector CT imaging of the pelvis was performed following the standard protocol without intravenous contrast. RADIATION DOSE REDUCTION: This exam was performed according to the departmental dose-optimization program which includes automated exposure control, adjustment of the mA and/or kV according to patient size and/or use of iterative reconstruction technique. COMPARISON:  Right hip radiographs-earlier same day FINDINGS: Musculoskeletal: Acute fractures involving the medial aspects of the right superior (image 43, series 3) and inferior (image 46, series 3) pubic rami without extension to the pubic symphysis. This finding is also associated with a minimally displaced fracture involving the junction of the right superior pubic ramus and the anterior superior aspect of the acetabulum (images 33 and 38, series 6), without extension to the weight-bearing surface of the acetabulum. Expected hematoma about the fracture site, most conspicuously involving the right pelvic sidewall (image 39, series 3). Ill-defined stranding about the soft tissues lateral to the proximal femur (image 41, series 3). No radiopaque foreign body. No additional fractures are identified. Curvilinear sclerosis  involving the weight-bearing surface of the bilateral femoral heads, right greater than left (representative axial images 34, 37 and 38, series 4 compatible with avascular necrosis without associated articular surface collapse or secondary to early advanced degenerative change. Grade 1-2 anterolisthesis of L4 upon L5 measuring approximately 1 cm without associated pars defects. Severe DDD of L4-L5 with near complete disc space height loss, endplate irregularity and sclerosis. Severe bilateral facet degenerative change within the imaged lower lumbar spine. Other: Expected hematoma about the fracture site, most conspicuously involving the right pelvic sidewall (image 39, series 3). Ill-defined stranding about the soft tissues lateral to the proximal femur (image 41, series 3). No radiopaque foreign body. Urinary Tract: Normal appearance of the urinary bladder given degree distention. Bowel: Moderate colonic stool burden without evidence of enteric obstruction. Normal appearance of  the terminal ileum and appendix. Vascular/Lymphatic: Minimal amount of atherosclerotic plaque. Incidentally noted mildly hypertrophied left gonadal vein. No bulky retroperitoneal, pelvic or inguinal lymphadenopathy. Reproductive:  Post hysterectomy.  No discrete adnexal lesions. IMPRESSION: 1. The examination is positive for acute minimally displaced fractures involving the medial aspects of the right superior and inferior pubic rami as well as the junction of the right superior pubic ramus and acetabulum without extension to the weight-bearing surface of the right hip. 2. Small, expected right pelvic sidewall hematoma and subcutaneous stranding about the proximal lateral aspect the right femur without radiopaque foreign body. 3. Avascular necrosis of the bilateral femoral heads, right greater than left, without associated articular surface collapse or advanced secondary degenerative change. Electronically Signed   By: Sandi Mariscal M.D.   On:  01/13/2022 10:40  ? ?DG Hip Unilat W or Wo Pelvis 2-3 Views Right ? ?Result Date: 01/13/2022 ?CLINICAL DATA:  Last night with right hip pain EXAM: DG HIP (WITH OR WITHOUT PELVIS) 2-3V RIGHT COMPARISON:  None. FINDINGS: T

## 2022-01-13 NOTE — Plan of Care (Signed)
  Problem: Education: Goal: Knowledge of General Education information will improve Description Including pain rating scale, medication(s)/side effects and non-pharmacologic comfort measures Outcome: Progressing   

## 2022-01-13 NOTE — Consult Note (Addendum)
? ? ?Patient ID: ?[name] ?MRN: [id number] ?DOB/AGE: 1946/06/29 76 y.o. ? ?Admit date: 01/13/2022 ? ?Admission Diagnoses:  ?Principal Problem: ?  Pubic ramus fracture, right, closed, initial encounter ([place]) ?Active Problems: ?  Hypothyroid ?  Hypokalemia ?  Pubic ramus fracture (HCC) ? ? ?HPI: ?Ortho Consult for pelvic fractures identified on CT imaging today in the ED. Patient had a ground level fall without loss of consciousness on Friday, January 12, 2022. She was brought into the [facility] emergency department with right hip pain. Of note, she has had bilateral total knee replacements by Dr. Amada Jupiter years ago. She has also had bunion surgery by Dr. Wylene Simmer previously. She is generally healthy. She reports a history of osteopenia and annual bone density testing. She is accompanied at bedside by her husband. They live together in their [place] home. ? ?Past Medical History: ?Past Medical History:  ?Diagnosis Date  ? Arthritis   ? Dr Estanislado Pandy  ? Cataract   ? Fracture 10/2016  ? left ankel  ? History of hiatal hernia   ? found on endoscopy  ? Hypothyroidism   ? Insomnia due to anxiety and fear   ? Snoring 09/29/2014  ? ? ?Surgical History: ?Past Surgical History:  ?Procedure Laterality Date  ? ABDOMINAL HYSTERECTOMY  1987  ? TAH,BSO, APPENDECTOMY  ? APPENDECTOMY  1987  ? APPENDECTOMY AT TAH,BSO  ? CATARACT EXTRACTION, BILATERAL Bilateral   ? 20 plus years ago  ? COLONOSCOPY    ?  X 3; [facility]. All negative  ? KNEE ARTHROSCOPY Bilateral   ? METATARSAL OSTEOTOMY WITH BUNIONECTOMY Left 10/31/2017  ? Procedure: Left First Metatarsal Scarf, Modified McBride and Akin Osteotomies;  Surgeon: Wylene Simmer, MD;  Location: [facility];  Service: Orthopedics;  Laterality: Left;  ? REPLACEMENT TOTAL KNEE Bilateral   ? X 1 each; Dr Percell Miller  ? ROTATOR CUFF REPAIR Right 2010  ? ? ?Family History: ?Family History  ?Problem Relation Age of Onset  ? Ovarian cancer Mother 64  ? Alcohol abuse Father   ?  Cirrhosis Father   ? Stroke Sister 2  ? Diabetes Paternal Grandmother   ? Heart disease Neg Hx   ? Hypertension Neg Hx   ? Hyperlipidemia Neg Hx   ? Colon cancer Neg Hx   ? ? ?Social History: ?Social History  ? ?Socioeconomic History  ? Marital status: Married  ?  Spouse name: Not on file  ? Number of children: 0  ? Years of education: Not on file  ? Highest education level: Not on file  ?Occupational History  ? Occupation: retired  ?  Employer: NOT EMPLOYED  ?  Comment: Education officer, [employer]   ?Tobacco Use  ? Smoking status: Never  ? Smokeless tobacco: Never  ?Vaping Use  ? Vaping Use: Never used  ?Substance and Sexual Activity  ? Alcohol use: Yes  ?  Alcohol/week: 14.0 standard drinks  ?  Types: 14 Standard drinks or equivalent per week  ?  Comment: 1 drink nightly  ? Drug use: No  ? Sexual activity: Not Currently  ?  Birth control/protection: Surgical  ?Other Topics Concern  ? Not on file  ?Social History Narrative  ? No children   ? 2 dogs   ? 3 horses   ? Husband patient of Dr. Birdie Riddle   ? ?Social Determinants of Health  ? ?Financial Resource Strain: Low Risk   ? Difficulty of Paying Living Expenses: Not hard at all  ?Food  Insecurity: No Food Insecurity  ? Worried About Charity fundraiser in the Last Year: Never true  ? Ran Out of Food in the Last Year: Never true  ?Transportation Needs: No Transportation Needs  ? Lack of Transportation (Medical): No  ? Lack of Transportation (Non-Medical): No  ?Physical Activity: Sufficiently Active  ? Days of Exercise per Week: 5 days  ? Minutes of Exercise per Session: 60 min  ?Stress: No Stress Concern Present  ? Feeling of Stress : Not at all  ?Social Connections: Moderately Integrated  ? Frequency of Communication with Friends and Family: More than three times a week  ? Frequency of Social Gatherings with Friends and Family: More than three times a week  ? Attends Religious Services: Never  ? Active Member of Clubs or Organizations: Yes  ? Attends Archivist Meetings:  More than 4 times per year  ? Marital Status: Married  ?Intimate Partner Violence: Not At Risk  ? Fear of Current or Ex-Partner: No  ? Emotionally Abused: No  ? Physically Abused: No  ? Sexually Abused: No  ? ? ?Allergies: ?Codeine and Formaldehyde ? ?Medications: ?I have reviewed the patient's current medications. ? ?Vital Signs: ?Patient Vitals for the past 24 hrs: ? BP Temp Temp src Pulse Resp SpO2 Height Weight  ?01/13/22 1350 126/70 -- -- 81 18 95 % -- --  ?01/13/22 1130 126/73 -- -- 93 17 97 % -- --  ?01/13/22 1047 129/74 -- -- 78 20 97 % -- --  ?01/13/22 0830 -- -- -- -- -- -- '5\' 3"'$  (1.6 m) 56.7 kg  ?01/13/22 0820 131/68 98 ?F (36.7 ?C) Oral 76 19 99 % -- --  ? ? ?Radiology: ?CT PELVIS WO CONTRAST ? ?Result Date: 01/13/2022 ?CLINICAL DATA:  Post fall with persistent right-sided hip pain. Evaluate for fracture. EXAM: CT PELVIS WITHOUT CONTRAST TECHNIQUE: Multidetector CT imaging of the pelvis was performed following the standard protocol without intravenous contrast. RADIATION DOSE REDUCTION: This exam was performed according to the departmental dose-optimization program which includes automated exposure control, adjustment of the mA and/or kV according to patient size and/or use of iterative reconstruction technique. COMPARISON:  Right hip radiographs-earlier same day FINDINGS: Musculoskeletal: Acute fractures involving the medial aspects of the right superior (image 43, series 3) and inferior (image 46, series 3) pubic rami without extension to the pubic symphysis. This finding is also associated with a minimally displaced fracture involving the junction of the right superior pubic ramus and the anterior superior aspect of the acetabulum (images 33 and 38, series 6), without extension to the weight-bearing surface of the acetabulum. Expected hematoma about the fracture site, most conspicuously involving the right pelvic sidewall (image 39, series 3). Ill-defined stranding about the soft tissues lateral to the  proximal femur (image 41, series 3). No radiopaque foreign body. No additional fractures are identified. Curvilinear sclerosis involving the weight-bearing surface of the bilateral femoral heads, right greater than left (representative axial images 34, 37 and 38, series 4 compatible with avascular necrosis without associated articular surface collapse or secondary to early advanced degenerative change. Grade 1-2 anterolisthesis of L4 upon L5 measuring approximately 1 cm without associated pars defects. Severe DDD of L4-L5 with near complete disc space height loss, endplate irregularity and sclerosis. Severe bilateral facet degenerative change within the imaged lower lumbar spine. Other: Expected hematoma about the fracture site, most conspicuously involving the right pelvic sidewall (image 39, series 3). Ill-defined stranding about the soft tissues lateral to the proximal  femur (image 41, series 3). No radiopaque foreign body. Urinary Tract: Normal appearance of the urinary bladder given degree distention. Bowel: Moderate colonic stool burden without evidence of enteric obstruction. Normal appearance of the terminal ileum and appendix. Vascular/Lymphatic: Minimal amount of atherosclerotic plaque. Incidentally noted mildly hypertrophied left gonadal vein. No bulky retroperitoneal, pelvic or inguinal lymphadenopathy. Reproductive:  Post hysterectomy.  No discrete adnexal lesions. IMPRESSION: 1. The examination is positive for acute minimally displaced fractures involving the medial aspects of the right superior and inferior pubic rami as well as the junction of the right superior pubic ramus and acetabulum without extension to the weight-bearing surface of the right hip. 2. Small, expected right pelvic sidewall hematoma and subcutaneous stranding about the proximal lateral aspect the right femur without radiopaque foreign body. 3. Avascular necrosis of the bilateral femoral heads, right greater than left, without  associated articular surface collapse or advanced secondary degenerative change. Electronically Signed   By: Sandi Mariscal M.D.   On: 01/13/2022 10:40  ? ?DG Hip Unilat W or Wo Pelvis 2-3 Views Right ? ?Resu

## 2022-01-13 NOTE — ED Triage Notes (Addendum)
Patient presented to the ED with c/o fall last night.Patient report she is unable to ambulate this morning when she woke up. Patient denies hitting her head and she is not on blood thinners.  Patient c/o right hip pain 8/10 with movement. ?

## 2022-01-13 NOTE — H&P (Signed)
?History and Physical  ? ? ?[name] PVX:[id number] DOB: 1946/05/06 DOA: 01/13/2022 ? ?PCP: Midge Minium, MD  ? ?Patient coming from: Home ? ?I have personally briefly reviewed patient's old medical records in [place] ? ?Chief Complaint: Fall and right hip pain ? ?HPI: [name] is a 76 y.o. female with medical history significant of hypothyroidism presented with fall and right hip pain.  Patient apparently had a mechanical fall last night.  She woke up this morning and has been unable to ambulate with complaints of 8 out of 10 right hip pain, sharp in nature, worsening with ambulation, with no relieving factors, no radiation.  Patient denies any dizziness, loss of consciousness, seizures, fever, nausea, vomiting, cough, shortness of breath, diarrhea or dysuria. ? ?ED Course:  ?Hospitalist service was called to evaluate the patient.  CT of the pelvis showed right superior and inferior pubic ramus fracture along with pelvic hematoma and avascular necrosis of bilateral femoral heads.  Patient was unable to ambulate.  Orthopedics was consulted. ? ?Review of Systems: As per HPI otherwise all other systems were reviewed and are negative. ? ? ?Past Medical History:  ?Diagnosis Date  ? Arthritis   ? Dr Estanislado Pandy  ? Cataract   ? Fracture 10/2016  ? left ankel  ? History of hiatal hernia   ? found on endoscopy  ? Hypothyroidism   ? Insomnia due to anxiety and fear   ? Snoring 09/29/2014  ? ? ?Past Surgical History:  ?Procedure Laterality Date  ? ABDOMINAL HYSTERECTOMY  1987  ? TAH,BSO, APPENDECTOMY  ? APPENDECTOMY  1987  ? APPENDECTOMY AT TAH,BSO  ? CATARACT EXTRACTION, BILATERAL Bilateral   ? 20 plus years ago  ? COLONOSCOPY    ?  X 3; [facility]. All negative  ? KNEE ARTHROSCOPY Bilateral   ? METATARSAL OSTEOTOMY WITH BUNIONECTOMY Left 10/31/2017  ? Procedure: Left First Metatarsal Scarf, Modified McBride and Akin Osteotomies;  Surgeon: Wylene Simmer, MD;  Location: [facility];   Service: Orthopedics;  Laterality: Left;  ? REPLACEMENT TOTAL KNEE Bilateral   ? X 1 each; Dr Percell Miller  ? ROTATOR CUFF REPAIR Right 2010  ? ? ? reports that she has never smoked. She has never used smokeless tobacco. She reports current alcohol use of about 14.0 standard drinks per week. She reports that she does not use drugs. ? ?Allergies  ?Allergen Reactions  ? Codeine Nausea And Vomiting  ? Formaldehyde Other (See Comments)  ?  Allergy testing, unknown reaction  ? ? ?Family History  ?Problem Relation Age of Onset  ? Ovarian cancer Mother 36  ? Alcohol abuse Father   ? Cirrhosis Father   ? Stroke Sister 62  ? Diabetes Paternal Grandmother   ? Heart disease Neg Hx   ? Hypertension Neg Hx   ? Hyperlipidemia Neg Hx   ? Colon cancer Neg Hx   ? ? ?Prior to Admission medications   ?Medication Sig Start Date End Date Taking? Authorizing Provider  ?Biotin 5 MG CAPS Take 5 mg by mouth daily.    [provider]  ?Calcium Carbonate-Vitamin D (CALCIUM + D PO) Take 1 tablet by mouth daily.     [provider]  ?Cholecalciferol (VITAMIN D PO) Take by mouth.    [provider]  ?diclofenac Sodium (VOLTAREN) 1 % GEL Apply topically 4 (four) times daily.    [provider]  ?Evening Primrose Oil CAPS Take 2 capsules by mouth daily.  [provider]  ?GLUCOSAMINE PO Take 3,000 mg by mouth.     [provider]  ?levothyroxine (SYNTHROID) 50 MCG tablet Take 1 tablet (50 mcg total) by mouth daily before breakfast. 11/17/21   Midge Minium, MD  ?MILK THISTLE PO Take by mouth.    [provider]  ?Multiple Vitamin (MULTIVITAMIN) capsule Take 1 capsule by mouth daily.    [provider]  ?Omega-3 Fatty Acids (OMEGA 3 PO) Take 3,600 mg by mouth.     [provider]  ?QUEtiapine (SEROQUEL) 25 MG tablet TAKE 3 TABLETS BY MOUTH AT BEDTIME 05/01/21   Ward Givens, NP  ?tiZANidine (ZANAFLEX) 4 MG tablet TAKE 1 TABLET(4 MG) BY MOUTH AT BEDTIME 01/08/22    Ward Givens, NP  ?triamterene-hydrochlorothiazide (DYAZIDE) 37.5-25 MG capsule Take 1 each (1 capsule total) by mouth every morning. 01/30/17   Huel Cote, NP  ? ? ?Physical Exam: ?Vitals:  ? 01/13/22 0830 01/13/22 1047 01/13/22 1130 01/13/22 1350  ?BP:  129/74 126/73 126/70  ?Pulse:  78 93 81  ?Resp:  '20 17 18  '$ ?Temp:      ?TempSrc:      ?SpO2:  97% 97% 95%  ?Weight: 56.7 kg     ?Height: '5\' 3"'$  (1.6 m)     ? ? ?Constitutional: NAD, calm, comfortable ?Vitals:  ? 01/13/22 0830 01/13/22 1047 01/13/22 1130 01/13/22 1350  ?BP:  129/74 126/73 126/70  ?Pulse:  78 93 81  ?Resp:  '20 17 18  '$ ?Temp:      ?TempSrc:      ?SpO2:  97% 97% 95%  ?Weight: 56.7 kg     ?Height: '5\' 3"'$  (1.6 m)     ? ?Eyes: PERRL, lids and conjunctivae normal ?ENMT: Mucous membranes are moist. Posterior pharynx clear of any exudate or lesions. ?Neck: normal, supple, no masses, no thyromegaly ?Respiratory: bilateral decreased breath sounds at bases, no wheezing, no crackles. Normal respiratory effort. No accessory muscle use.  ?Cardiovascular: S1 S2 positive, rate controlled. No extremity edema. 2+ pedal pulses.  ?Abdomen: no tenderness, no masses palpated. No hepatosplenomegaly. Bowel sounds positive.  ?Musculoskeletal: no clubbing / cyanosis.  Right hip tenderness present.   ?Skin: no rashes, lesions, ulcers. No induration ?Neurologic: CN 2-12 grossly intact. Moving extremities. No focal neurologic deficits.  ?Psychiatric: Normal judgment and insight. Alert and oriented x 3. Normal mood.  ? ? ?Labs on Admission: I have personally reviewed following labs and imaging studies ? ?CBC: ?Recent Labs  ?Lab 01/13/22 ?1305  ?WBC 6.3  ?NEUTROABS 4.5  ?HGB 13.4  ?HCT 37.0  ?MCV 95.9  ?PLT 168  ? ?Basic Metabolic Panel: ?Recent Labs  ?Lab 01/13/22 ?1305  ?NA 135  ?K 3.4*  ?CL 101  ?CO2 24  ?GLUCOSE 94  ?BUN 19  ?CREATININE 0.65  ?CALCIUM 9.2  ? ?GFR: ?Estimated Creatinine Clearance: 50.3 mL/min (by C-G formula based on SCr of 0.65 mg/dL). ?Liver Function  Tests: ?Recent Labs  ?Lab 01/13/22 ?1305  ?AST 15  ?ALT 28  ?ALKPHOS 41  ?BILITOT 1.0  ?PROT 6.8  ?ALBUMIN 4.2  ? ?No results for input(s): LIPASE, AMYLASE in the last 168 hours. ?No results for input(s): AMMONIA in the last 168 hours. ?Coagulation Profile: ?No results for input(s): INR, PROTIME in the last 168 hours. ?Cardiac Enzymes: ?No results for input(s): CKTOTAL, CKMB, CKMBINDEX, TROPONINI in the last 168 hours. ?BNP (last 3 results) ?No results for input(s): PROBNP in the last 8760 hours. ?HbA1C: ?No results for input(s): HGBA1C in  the last 72 hours. ?CBG: ?No results for input(s): GLUCAP in the last 168 hours. ?Lipid Profile: ?No results for input(s): CHOL, HDL, LDLCALC, TRIG, CHOLHDL, LDLDIRECT in the last 72 hours. ?Thyroid Function Tests: ?No results for input(s): TSH, T4TOTAL, FREET4, T3FREE, THYROIDAB in the last 72 hours. ?Anemia Panel: ?No results for input(s): VITAMINB12, FOLATE, FERRITIN, TIBC, IRON, RETICCTPCT in the last 72 hours. ?Urine analysis: ?   ?Component Value Date/Time  ? COLORURINE DARK YELLOW 03/23/2019 1122  ? APPEARANCEUR CLOUDY (A) 03/23/2019 1122  ? LABSPEC 1.016 03/23/2019 1122  ? PHURINE > OR = 8.5 (A) 03/23/2019 1122  ? GLUCOSEU NEGATIVE 03/23/2019 1122  ? HGBUR NEGATIVE 03/23/2019 1122  ? BILIRUBINUR NEGATIVE 01/30/2016 1440  ? [place] NEGATIVE 03/23/2019 1122  ? PROTEINUR NEGATIVE 03/23/2019 1122  ? UROBILINOGEN 0.2 01/26/2015 1440  ? NITRITE NEGATIVE 01/30/2016 1440  ? LEUKOCYTESUR NEGATIVE 01/30/2016 1440  ? ? ?Radiological Exams on Admission: ?CT PELVIS WO CONTRAST ? ?Result Date: 01/13/2022 ?CLINICAL DATA:  Post fall with persistent right-sided hip pain. Evaluate for fracture. EXAM: CT PELVIS WITHOUT CONTRAST TECHNIQUE: Multidetector CT imaging of the pelvis was performed following the standard protocol without intravenous contrast. RADIATION DOSE REDUCTION: This exam was performed according to the departmental dose-optimization program which includes automated exposure  control, adjustment of the mA and/or kV according to patient size and/or use of iterative reconstruction technique. COMPARISON:  Right hip radiographs-earlier same day FINDINGS: Musculoskeletal: Acute fractures invol

## 2022-01-14 ENCOUNTER — Observation Stay (HOSPITAL_COMMUNITY): Payer: PPO

## 2022-01-14 DIAGNOSIS — S32591A Other specified fracture of right pubis, initial encounter for closed fracture: Secondary | ICD-10-CM | POA: Diagnosis not present

## 2022-01-14 DIAGNOSIS — D649 Anemia, unspecified: Secondary | ICD-10-CM

## 2022-01-14 DIAGNOSIS — E876 Hypokalemia: Secondary | ICD-10-CM | POA: Diagnosis not present

## 2022-01-14 DIAGNOSIS — D696 Thrombocytopenia, unspecified: Secondary | ICD-10-CM

## 2022-01-14 DIAGNOSIS — E039 Hypothyroidism, unspecified: Secondary | ICD-10-CM | POA: Diagnosis not present

## 2022-01-14 DIAGNOSIS — S32511A Fracture of superior rim of right pubis, initial encounter for closed fracture: Secondary | ICD-10-CM | POA: Diagnosis not present

## 2022-01-14 LAB — CBC
HCT: 34 % — ABNORMAL LOW (ref 36.0–46.0)
Hemoglobin: 11.7 g/dL — ABNORMAL LOW (ref 12.0–15.0)
MCH: 33.4 pg (ref 26.0–34.0)
MCHC: 34.4 g/dL (ref 30.0–36.0)
MCV: 97.1 fL (ref 80.0–100.0)
Platelets: 143 10*3/uL — ABNORMAL LOW (ref 150–400)
RBC: 3.5 MIL/uL — ABNORMAL LOW (ref 3.87–5.11)
RDW: 12.5 % (ref 11.5–15.5)
WBC: 4.8 10*3/uL (ref 4.0–10.5)
nRBC: 0 % (ref 0.0–0.2)

## 2022-01-14 LAB — COMPREHENSIVE METABOLIC PANEL
ALT: 21 U/L (ref 0–44)
AST: 12 U/L — ABNORMAL LOW (ref 15–41)
Albumin: 3.3 g/dL — ABNORMAL LOW (ref 3.5–5.0)
Alkaline Phosphatase: 31 U/L — ABNORMAL LOW (ref 38–126)
Anion gap: 9 (ref 5–15)
BUN: 18 mg/dL (ref 8–23)
CO2: 24 mmol/L (ref 22–32)
Calcium: 8.5 mg/dL — ABNORMAL LOW (ref 8.9–10.3)
Chloride: 103 mmol/L (ref 98–111)
Creatinine, Ser: 0.61 mg/dL (ref 0.44–1.00)
GFR, Estimated: >60 mL/min (ref >60)
Glucose, Bld: 88 mg/dL (ref 70–99)
Potassium: 3.2 mmol/L — ABNORMAL LOW (ref 3.5–5.1)
Sodium: 136 mmol/L (ref 135–145)
Total Bilirubin: 0.7 mg/dL (ref 0.3–1.2)
Total Protein: 5.6 g/dL — ABNORMAL LOW (ref 6.5–8.1)

## 2022-01-14 LAB — MAGNESIUM: Magnesium: 2.5 mg/dL — ABNORMAL HIGH (ref 1.7–2.4)

## 2022-01-14 MED ORDER — POTASSIUM CHLORIDE CRYS ER 20 MEQ PO TBCR
40.0000 meq | EXTENDED_RELEASE_TABLET | ORAL | Status: AC
  Administered 2022-01-14 (×2): 40 meq via ORAL
  Filled 2022-01-14 (×2): qty 2

## 2022-01-14 NOTE — Progress Notes (Signed)
?PROGRESS NOTE ? ? ? ?Shelly SCHOON  OHY:[id number] DOB: 1945-12-03 DOA: 01/13/2022 ?PCP: Midge Minium, MD  ? ?Brief Narrative:  ? 76 y.o. female with medical history significant of hypothyroidism presented with fall and right hip pain.  CT of the pelvis showed right superior and inferior pubic ramus fracture along with pelvic hematoma and avascular necrosis of bilateral femoral heads.  Patient was unable to ambulate.  Orthopedics was consulted. ? ?Assessment & Plan: ?  ?  ?Right superior and inferior pubic rami fractures ?Right pelvic hematoma ?Possible mechanical fall ?Severe right hip pain and ambulatory dysfunction ?Avascular necrosis of bilateral femoral heads ?-Still complains of significant pain: Continue pain management.  Fall precautions.  PT eval pending.   ?-Orthopedics evaluated the patient on 01/13/2022: Recommended weightbearing as tolerated on the right lower extremity with walker.  Outpatient follow-up with orthopedics. ?  ?Hypokalemia ?-Mild.  Replace. ?  ?Hypothyroidism ?-Continue Synthroid ? ?Thrombocytopenia ?-Questionable cause.  No signs of bleeding ? ?Normocytic anemia ?-Hemoglobin stable. ?  ?DVT prophylaxis: Lovenox ?Code Status: DNR ?Family Communication: None at bedside ?Disposition Plan: ?Status is: Observation ?The patient remains OBS appropriate and will d/c before 2 midnights.  Pending PT eval.  If tolerates PT, might even discharge her home today. ? ? ? ?Consultants: Orthopedics ? ?Procedures: None ? ?Antimicrobials: None ? ? ?Subjective: ?Patient seen and examined at bedside.  No overnight fever, nausea, vomiting reported.  Still complains of right hip pain. ? ?Objective: ?Vitals:  ? 01/13/22 1615 01/13/22 2035 01/14/22 0109 01/14/22 0424  ?BP: 126/66 135/63 106/68 (!) 110/57  ?Pulse: 79 75 77 76  ?Resp: '12 14 14 14  '$ ?Temp: 99.1 ?F (37.3 ?C) 98.3 ?F (36.8 ?C) 97.9 ?F (36.6 ?C) 97.8 ?F (36.6 ?C)  ?TempSrc: Oral Oral Oral Oral  ?SpO2: 97% 97% 96% 96%  ?Weight:      ?Height:       ? ? ?Intake/Output Summary (Last 24 hours) at 01/14/2022 1007 ?Last data filed at 01/14/2022 0200 ?Gross per 24 hour  ?Intake 600 ml  ?Output --  ?Net 600 ml  ? ?Filed Weights  ? 01/13/22 0830  ?Weight: 56.7 kg  ? ? ?Examination: ? ?General exam: Appears calm and comfortable.  Currently on room air. ?Respiratory system: Bilateral decreased breath sounds at bases ?Cardiovascular system: S1 & S2 heard, Rate controlled ?Gastrointestinal system: Abdomen is nondistended, soft and nontender. Normal bowel sounds heard. ?Extremities: No cyanosis, clubbing, edema.  Right hip tenderness present ? ? ?Data Reviewed: I have personally reviewed following labs and imaging studies ? ?CBC: ?Recent Labs  ?Lab 01/13/22 ?1305 01/14/22 ?0325  ?WBC 6.3 4.8  ?NEUTROABS 4.5  --   ?HGB 13.4 11.7*  ?HCT 37.0 34.0*  ?MCV 95.9 97.1  ?PLT 168 143*  ? ?Basic Metabolic Panel: ?Recent Labs  ?Lab 01/13/22 ?1305 01/14/22 ?0325  ?NA 135 136  ?K 3.4* 3.2*  ?CL 101 103  ?CO2 24 24  ?GLUCOSE 94 88  ?BUN 19 18  ?CREATININE 0.65 0.61  ?CALCIUM 9.2 8.5*  ?MG  --  2.5*  ? ?GFR: ?Estimated Creatinine Clearance: 50.3 mL/min (by C-G formula based on SCr of 0.61 mg/dL). ?Liver Function Tests: ?Recent Labs  ?Lab 01/13/22 ?1305 01/14/22 ?0325  ?AST 15 12*  ?ALT 28 21  ?ALKPHOS 41 31*  ?BILITOT 1.0 0.7  ?PROT 6.8 5.6*  ?ALBUMIN 4.2 3.3*  ? ?No results for input(s): LIPASE, AMYLASE in the last 168 hours. ?No results for input(s): AMMONIA in the last 168 hours. ?Coagulation Profile: ?  No results for input(s): INR, PROTIME in the last 168 hours. ?Cardiac Enzymes: ?No results for input(s): CKTOTAL, CKMB, CKMBINDEX, TROPONINI in the last 168 hours. ?BNP (last 3 results) ?No results for input(s): PROBNP in the last 8760 hours. ?HbA1C: ?No results for input(s): HGBA1C in the last 72 hours. ?CBG: ?No results for input(s): GLUCAP in the last 168 hours. ?Lipid Profile: ?No results for input(s): CHOL, HDL, LDLCALC, TRIG, CHOLHDL, LDLDIRECT in the last 72 hours. ?Thyroid  Function Tests: ?No results for input(s): TSH, T4TOTAL, FREET4, T3FREE, THYROIDAB in the last 72 hours. ?Anemia Panel: ?No results for input(s): VITAMINB12, FOLATE, FERRITIN, TIBC, IRON, RETICCTPCT in the last 72 hours. ?Sepsis Labs: ?No results for input(s): PROCALCITON, LATICACIDVEN in the last 168 hours. ? ?No results found for this or any previous visit (from the past 240 hour(s)).  ? ? ? ? ? ?Radiology Studies: ?CT PELVIS WO CONTRAST ? ?Result Date: 01/13/2022 ?CLINICAL DATA:  Post fall with persistent right-sided hip pain. Evaluate for fracture. EXAM: CT PELVIS WITHOUT CONTRAST TECHNIQUE: Multidetector CT imaging of the pelvis was performed following the standard protocol without intravenous contrast. RADIATION DOSE REDUCTION: This exam was performed according to the departmental dose-optimization program which includes automated exposure control, adjustment of the mA and/or kV according to patient size and/or use of iterative reconstruction technique. COMPARISON:  Right hip radiographs-earlier same day FINDINGS: Musculoskeletal: Acute fractures involving the medial aspects of the right superior (image 43, series 3) and inferior (image 46, series 3) pubic rami without extension to the pubic symphysis. This finding is also associated with a minimally displaced fracture involving the junction of the right superior pubic ramus and the anterior superior aspect of the acetabulum (images 33 and 38, series 6), without extension to the weight-bearing surface of the acetabulum. Expected hematoma about the fracture site, most conspicuously involving the right pelvic sidewall (image 39, series 3). Ill-defined stranding about the soft tissues lateral to the proximal femur (image 41, series 3). No radiopaque foreign body. No additional fractures are identified. Curvilinear sclerosis involving the weight-bearing surface of the bilateral femoral heads, right greater than left (representative axial images 34, 37 and 38, series  4 compatible with avascular necrosis without associated articular surface collapse or secondary to early advanced degenerative change. Grade 1-2 anterolisthesis of L4 upon L5 measuring approximately 1 cm without associated pars defects. Severe DDD of L4-L5 with near complete disc space height loss, endplate irregularity and sclerosis. Severe bilateral facet degenerative change within the imaged lower lumbar spine. Other: Expected hematoma about the fracture site, most conspicuously involving the right pelvic sidewall (image 39, series 3). Ill-defined stranding about the soft tissues lateral to the proximal femur (image 41, series 3). No radiopaque foreign body. Urinary Tract: Normal appearance of the urinary bladder given degree distention. Bowel: Moderate colonic stool burden without evidence of enteric obstruction. Normal appearance of the terminal ileum and appendix. Vascular/Lymphatic: Minimal amount of atherosclerotic plaque. Incidentally noted mildly hypertrophied left gonadal vein. No bulky retroperitoneal, pelvic or inguinal lymphadenopathy. Reproductive:  Post hysterectomy.  No discrete adnexal lesions. IMPRESSION: 1. The examination is positive for acute minimally displaced fractures involving the medial aspects of the right superior and inferior pubic rami as well as the junction of the right superior pubic ramus and acetabulum without extension to the weight-bearing surface of the right hip. 2. Small, expected right pelvic sidewall hematoma and subcutaneous stranding about the proximal lateral aspect the right femur without radiopaque foreign body. 3. Avascular necrosis of the bilateral femoral heads, right  greater than left, without associated articular surface collapse or advanced secondary degenerative change. Electronically Signed   By: Sandi Mariscal M.D.   On: 01/13/2022 10:40  ? ?DG Hip Unilat W or Wo Pelvis 2-3 Views Right ? ?Result Date: 01/13/2022 ?CLINICAL DATA:  Last night with right hip pain  EXAM: DG HIP (WITH OR WITHOUT PELVIS) 2-3V RIGHT COMPARISON:  None. FINDINGS: There is no evidence of hip fracture or dislocation. There is no evidence of arthropathy or other focal bone abnormality. IMPRESSION

## 2022-01-14 NOTE — Plan of Care (Signed)
  Problem: Education: Goal: Knowledge of General Education information will improve Description: Including pain rating scale, medication(s)/side effects and non-pharmacologic comfort measures Outcome: Progressing   Problem: Activity: Goal: Risk for activity intolerance will decrease Outcome: Progressing   Problem: Pain Managment: Goal: General experience of comfort will improve Outcome: Progressing   

## 2022-01-14 NOTE — Evaluation (Addendum)
Physical Therapy Evaluation ?Patient Details ?Name: [name] ?MRN: [id number] ?DOB: 07-26-1946 ?Today's Date: 01/14/2022 ? ?History of Present Illness ? 76 yo female admitted with R pubic ramus fx after falling at home. Hx of OA, osteopenia, bil TKA, hypothyroidism  ?Clinical Impression ? On eval, pt required Min-guard-Min A for mobility. She walked ~20 feet with a RW. Distance was limited by pain and fatigue. Pain was rated 8/10 with activity. Will plan to follow and progress activity as tolerated. Feel pt will benefit from another night's stay to continue to work on pain control and mobility with PT.  Will need to work on stair training but pt did not feel ready to attempt due to pain.  ?   ? ?Recommendations for follow up therapy are one component of a multi-disciplinary discharge planning process, led by the attending physician.  Recommendations may be updated based on patient status, additional functional criteria and insurance authorization. ? ?Follow Up Recommendations Home health PT ? ?  ?Assistance Recommended at Discharge Intermittent Supervision/Assistance  ?Patient can return home with the following ? A little help with walking and/or transfers;A little help with bathing/dressing/bathroom;Assistance with cooking/housework;Assist for transportation;Help with stairs or ramp for entrance ? ?  ?Equipment Recommendations Rolling walker (2 wheels)  ?Recommendations for Other Services ?    ?  ?Functional Status Assessment Patient has had a recent decline in their functional status and demonstrates the ability to make significant improvements in function in a reasonable and predictable amount of time.  ? ?  ?Precautions / Restrictions Precautions ?Precautions: Fall ?Restrictions ?Weight Bearing Restrictions: No ?RLE Weight Bearing: Weight bearing as tolerated  ? ?  ? ?Mobility ? Bed Mobility ?Overal bed mobility: Needs Assistance ?Bed Mobility: Supine to Sit ?  ?  ?Supine to sit: Supervision, HOB elevated ?   ?  ?  ?  ? ?Transfers ?Overall transfer level: Needs assistance ?Equipment used: Rolling walker (2 wheels) ?Transfers: Sit to/from Stand ?Sit to Stand: Min guard ?  ?  ?  ?  ?  ?General transfer comment: Cues for safety, hand placement. ?  ? ?Ambulation/Gait ?Ambulation/Gait assistance: Min assist ?Gait Distance (Feet): 20 Feet ?Assistive device: Rolling walker (2 wheels) ?Gait Pattern/deviations: Step-to pattern, Antalgic, Decreased stance time - right ?  ?  ?  ?General Gait Details: Cues for safety, technique, sequence, proper use of RW. Intermittent assist to steady. Distance limited by pain, fatigue. ? ?Stairs ?  ?  ?  ?  ?  ? ?Wheelchair Mobility ?  ? ?Modified Rankin (Stroke Patients Only) ?  ? ?  ? ?Balance Overall balance assessment: Needs assistance ?  ?  ?  ?  ?Standing balance support: Bilateral upper extremity supported, During functional activity, Reliant on assistive device for balance ?Standing balance-Leahy Scale: Fair ?  ?  ?  ?  ?  ?  ?  ?  ?  ?  ?  ?  ?   ? ? ? ?Pertinent Vitals/Pain Pain Assessment ?Pain Assessment: 0-10 ?Pain Score: 8  ?Pain Location: R pelvis with activity ?Pain Descriptors / Indicators: Discomfort, Grimacing, Operative site guarding, Sharp ?Pain Intervention(s): Limited activity within patient's tolerance, Monitored during session, Premedicated before session, Repositioned  ? ? ?Home Living Family/patient expects to be discharged to:: Private residence ?Living Arrangements: Spouse/significant other ?Available Help at Discharge: Family ?Type of Home: House ?Home Access: Stairs to enter ?Entrance Stairs-Rails: Right;Left;Can reach both ?Entrance Stairs-Number of Steps: 3 ?  ?Home Layout: One level ?Home Equipment: None ?   ?  ?  Prior Function Prior Level of Function : Independent/Modified Independent ?  ?  ?  ?  ?  ?  ?  ?  ?  ? ? ?Hand Dominance  ?   ? ?  ?Extremity/Trunk Assessment  ? Upper Extremity Assessment ?Upper Extremity Assessment: Overall WFL for tasks assessed ?   ? ?Lower Extremity Assessment ?Lower Extremity Assessment: Generalized weakness ?  ? ?Cervical / Trunk Assessment ?Cervical / Trunk Assessment: Normal  ?Communication  ? Communication: No difficulties  ?Cognition Arousal/Alertness: Awake/alert ?Behavior During Therapy: [facility] for tasks assessed/performed ?Overall Cognitive Status: Within Functional Limits for tasks assessed ?  ?  ?  ?  ?  ?  ?  ?  ?  ?  ?  ?  ?  ?  ?  ?  ?  ?  ?  ? ?  ?General Comments   ? ?  ?Exercises    ? ?Assessment/Plan  ?  ?PT Assessment Patient needs continued PT services  ?PT Problem List Decreased strength;Decreased mobility;Decreased range of motion;Decreased activity tolerance;Decreased balance;Decreased knowledge of use of DME;Pain ? ?   ?  ?PT Treatment Interventions Gait training;DME instruction;Therapeutic exercise;Balance training;Stair training;Functional mobility training;Therapeutic activities;Patient/family education   ? ?PT Goals (Current goals can be found in the Care Plan section)  ?Acute Rehab PT Goals ?Patient Stated Goal: less pain. regain PLOF/independence ?PT Goal Formulation: With patient ?Time For Goal Achievement: 01/28/22 ?Potential to Achieve Goals: Good ? ?  ?Frequency Min 5X/week ?  ? ? ?Co-evaluation   ?  ?  ?  ?  ? ? ?  ?AM-PAC PT "6 Clicks" Mobility  ?Outcome Measure Help needed turning from your back to your side while in a flat bed without using bedrails?: None ?Help needed moving from lying on your back to sitting on the side of a flat bed without using bedrails?: None ?Help needed moving to and from a bed to a chair (including a wheelchair)?: A Little ?Help needed standing up from a chair using your arms (e.g., wheelchair or bedside chair)?: A Little ?Help needed to walk in hospital room?: A Little ?Help needed climbing 3-5 steps with a railing? : A Little ?6 Click Score: 20 ? ?  ?End of Session Equipment Utilized During Treatment: Gait belt ?Activity Tolerance: Patient limited by fatigue;Patient limited by  pain ?Patient left: in chair;with call bell/phone within reach;with family/visitor present ?  ?PT Visit Diagnosis: Unsteadiness on feet (R26.81);Pain;Difficulty in walking, not elsewhere classified (R26.2) ?Pain - Right/Left: Right ?Pain - part of body: Hip ?  ? ?Time: 9373-4287 ?PT Time Calculation (min) (ACUTE ONLY): 16 min ? ? ?Charges:   PT Evaluation ?$PT Eval Low Complexity: 1 Low ?  ?  ?   ? ? ? ? ?Jacqlyn Marolf P, PT ?Acute Rehabilitation  ?Office: [phone] ?Pager: [phone] ? ?  ? ?

## 2022-01-14 NOTE — Plan of Care (Signed)
  Problem: Coping: Goal: Level of anxiety will decrease Outcome: Progressing   Problem: Pain Managment: Goal: General experience of comfort will improve Outcome: Progressing   

## 2022-01-14 NOTE — Progress Notes (Signed)
Subjective: ?   ? ?Patient reports pain as mild. She is able to walk with walker with PT. Accompanied by her husband today.  ? ?Objective:  ? ?VITALS:  Temp:  [97.8 ?F (36.6 ?C)-99.1 ?F (37.3 ?C)] 97.8 ?F (36.6 ?C) (03/26 0424) ?Pulse Rate:  [75-81] 76 (03/26 0424) ?Resp:  [12-18] 14 (03/26 0424) ?BP: (106-135)/(57-70) 110/57 (03/26 0424) ?SpO2:  [95 %-97 %] 96 % (03/26 0424) ? ?Gen: AAOx3, NAD ?Comfortable at rest ?  ?Bilat Lower Extremity: ?Skin intact ?No TTP ?HF/KE/KF/ADF/APF/EHL 5/5 ?SILT throughout ?DP, PT 2+ to palp ?CR < 2s ? ? ?LABS ?Recent Labs  ?  01/13/22 ?1305 01/14/22 ?0325  ?HGB 13.4 11.7*  ?WBC 6.3 4.8  ?PLT 168 143*  ? ?Recent Labs  ?  01/13/22 ?1305 01/14/22 ?0325  ?NA 135 136  ?K 3.4* 3.2*  ?CL 101 103  ?CO2 24 24  ?BUN 19 18  ?CREATININE 0.65 0.61  ?GLUCOSE 94 88  ? ?No results for input(s): LABPT, INR in the last 72 hours. ? ? ?Assessment/Plan: ?Right superior and inferior pubic rami fx, DOI 01/12/22 ?  ?- history, exam and imaging reviewed again with patient and her husband. Post mobilization films also reviewed from today ?- weight bearing as tolerated right lower extremity with walker at all times and one person assist ?- PT consult as needed for mobilization ?- follow up as outpatient in 2 wk for repeat weight-bearing films (pt to call [phone] to schedule appt) ?  ?Armond Hang, MD ?Orthopaedic Surgeon ?[facility] ? ? ?

## 2022-01-14 NOTE — Progress Notes (Signed)
Physical Therapy Treatment ?Patient Details ?Name: [name] ?MRN: [id number] ?DOB: 03-Oct-1946 ?Today's Date: 01/14/2022 ? ? ?History of Present Illness 76 yo female admitted with R pubic ramus fx after falling at home. Hx of OA, osteopenia, bil TKA, hypothyroidism ? ?  ?PT Comments  ? ? Progressing slowly. Still with significant pain when ambulating. Will attempt stair negotiation training on tomorrow.    ?Recommendations for follow up therapy are one component of a multi-disciplinary discharge planning process, led by the attending physician.  Recommendations may be updated based on patient status, additional functional criteria and insurance authorization. ? ?Follow Up Recommendations ? Home health PT ?  ?  ?Assistance Recommended at Discharge Intermittent Supervision/Assistance  ?Patient can return home with the following A little help with walking and/or transfers;A little help with bathing/dressing/bathroom;Assistance with cooking/housework;Assist for transportation;Help with stairs or ramp for entrance ?  ?Equipment Recommendations ?  (pt stated she has a RW at home already)  ?  ?Recommendations for Other Services   ? ? ?  ?Precautions / Restrictions Precautions ?Precautions: Fall ?Restrictions ?Weight Bearing Restrictions: No ?RLE Weight Bearing: Weight bearing as tolerated  ?  ? ?Mobility ? Bed Mobility ?Overal bed mobility: Needs Assistance ?Bed Mobility: Supine to Sit, Sit to Supine ?  ?  ?Supine to sit: Supervision, HOB elevated ?Sit to supine: Min assist ?  ?General bed mobility comments: Assist for R LE onto bed. Increased time. Cues provided. Reminded pt she can use gait belt to assist if needed ?  ? ?Transfers ?Overall transfer level: Needs assistance ?Equipment used: Rolling walker (2 wheels) ?Transfers: Sit to/from Stand ?Sit to Stand: Min guard ?  ?  ?  ?  ?  ?General transfer comment: Cues for safety, hand placement. Increased time. ?  ? ?Ambulation/Gait ?Ambulation/Gait assistance: Min  assist ?Gait Distance (Feet): 30 Feet ?Assistive device: Rolling walker (2 wheels) ?Gait Pattern/deviations: Step-to pattern, Antalgic, Decreased stance time - right ?  ?  ?  ?General Gait Details: Cues for safety, technique, sequence, proper use of RW. Intermittent assist to steady. Distance limited by pain, fatigue. ? ? ?Stairs ?  ?  ?  ?  ?  ? ? ?Wheelchair Mobility ?  ? ?Modified Rankin (Stroke Patients Only) ?  ? ? ?  ?Balance Overall balance assessment: Needs assistance ?  ?  ?  ?  ?Standing balance support: Bilateral upper extremity supported, During functional activity, Reliant on assistive device for balance ?Standing balance-Leahy Scale: Fair ?  ?  ?  ?  ?  ?  ?  ?  ?  ?  ?  ?  ?  ? ?  ?Cognition Arousal/Alertness: Awake/alert ?Behavior During Therapy: [facility] for tasks assessed/performed ?Overall Cognitive Status: Within Functional Limits for tasks assessed ?  ?  ?  ?  ?  ?  ?  ?  ?  ?  ?  ?  ?  ?  ?  ?  ?  ?  ?  ? ?  ?Exercises Total Joint Exercises ?Ankle Circles/Pumps: AROM, Both, 10 reps ?Quad Sets: AROM, Both, 10 reps ?Heel Slides: AAROM, Right, 10 reps (pt used gait belt to assist) ?Hip ABduction/ADduction: AAROM, Right, 10 reps (pt used gait belt to assist) ? ?  ?General Comments   ?  ?  ? ?Pertinent Vitals/Pain Pain Assessment ?Pain Assessment: 0-10 ?Pain Score: 8  ?Pain Location: R pelvis with ambulation ?Pain Descriptors / Indicators: Discomfort, Grimacing, Operative site guarding, Sharp ?Pain Intervention(s): Limited activity within patient's tolerance, Monitored  during session, Repositioned  ? ? ?Home Living   ?  ?  ?  ?  ?  ?  ?  ?  ?  ?   ?  ?Prior Function    ?  ?  ?   ? ?PT Goals (current goals can now be found in the care plan section) Progress towards PT goals: Progressing toward goals ? ?  ?Frequency ? ? ? Min 5X/week ? ? ? ?  ?PT Plan Current plan remains appropriate  ? ? ?Co-evaluation   ?  ?  ?  ?  ? ?  ?AM-PAC PT "6 Clicks" Mobility   ?Outcome Measure ? Help needed turning from your  back to your side while in a flat bed without using bedrails?: None ?Help needed moving from lying on your back to sitting on the side of a flat bed without using bedrails?: A Little ?Help needed moving to and from a bed to a chair (including a wheelchair)?: A Little ?Help needed standing up from a chair using your arms (e.g., wheelchair or bedside chair)?: A Little ?Help needed to walk in hospital room?: A Little ?Help needed climbing 3-5 steps with a railing? : A Little ?6 Click Score: 19 ? ?  ?End of Session Equipment Utilized During Treatment: Gait belt ?Activity Tolerance: Patient limited by fatigue;Patient limited by pain ?Patient left: in bed;with call bell/phone within reach;with family/visitor present ?  ?PT Visit Diagnosis: Unsteadiness on feet (R26.81);Pain;Difficulty in walking, not elsewhere classified (R26.2) ?Pain - Right/Left: Right ?Pain - part of body: Hip ?  ? ? ?Time: 9326-7124 ?PT Time Calculation (min) (ACUTE ONLY): 24 min ? ?Charges:  $Gait Training: 8-22 mins ?$Therapeutic Exercise: 8-22 mins          ?          ? ? ? ? ?Doreatha Massed, PT ?Acute Rehabilitation  ?Office: [phone] ?Pager: [phone] ? ?  ? ?

## 2022-01-15 DIAGNOSIS — D649 Anemia, unspecified: Secondary | ICD-10-CM | POA: Diagnosis not present

## 2022-01-15 DIAGNOSIS — S32591A Other specified fracture of right pubis, initial encounter for closed fracture: Secondary | ICD-10-CM | POA: Diagnosis not present

## 2022-01-15 DIAGNOSIS — D696 Thrombocytopenia, unspecified: Secondary | ICD-10-CM | POA: Diagnosis not present

## 2022-01-15 DIAGNOSIS — E039 Hypothyroidism, unspecified: Secondary | ICD-10-CM | POA: Diagnosis not present

## 2022-01-15 DIAGNOSIS — E876 Hypokalemia: Secondary | ICD-10-CM | POA: Diagnosis not present

## 2022-01-15 NOTE — Progress Notes (Signed)
Orthopedic Tech Progress Note ?Patient Details:  ?[name] ?04-03-1946 ?[id number] ? ?Ortho Devices ?Type of Ortho Device: Crutches ?Ortho Device/Splint Interventions: Adjustment ?  ?Post Interventions ?Patient Tolerated: Well ?Instructions Provided: Care of device, Poper ambulation with device, Adjustment of device ? ?[name] ?01/15/2022, 11:04 AM ? ?

## 2022-01-15 NOTE — Progress Notes (Signed)
Physical Therapy Treatment ?Patient Details ?Name: [name] ?MRN: [id number] ?DOB: 1946-03-24 ?Today's Date: 01/15/2022 ? ? ?History of Present Illness 76 yo female admitted with R pubic ramus fx after falling at home. Hx of OA, osteopenia, bil TKA, hypothyroidism ? ?  ?PT Comments  ? ? Improved activity tolerance this afternoon compared to this morning. Pt was able to get through session without becoming tearful due to pain. Both pt and husband requested to stay one more night to have one more session with PT and to ensure pain is controlled/tolerable with activity-made team aware.  ?   ?Recommendations for follow up therapy are one component of a multi-disciplinary discharge planning process, led by the attending physician.  Recommendations may be updated based on patient status, additional functional criteria and insurance authorization. ? ?Follow Up Recommendations ? Home health PT ?  ?  ?Assistance Recommended at Discharge Frequent or constant Supervision/Assistance  ?Patient can return home with the following A little help with walking and/or transfers;A little help with bathing/dressing/bathroom;Assistance with cooking/housework;Assist for transportation;Help with stairs or ramp for entrance ?  ?Equipment Recommendations ? BSC/3in1 (already has a RW)  ?  ?Recommendations for Other Services   ? ? ?  ?Precautions / Restrictions Precautions ?Precautions: Fall ?Restrictions ?Weight Bearing Restrictions: No ?RLE Weight Bearing: Weight bearing as tolerated  ?  ? ?Mobility ? Bed Mobility ?Overal bed mobility: Needs Assistance ?Bed Mobility: Supine to Sit, Sit to Supine ?  ?  ?Supine to sit: Supervision, HOB elevated ?Sit to supine: Min assist, HOB elevated ?  ?General bed mobility comments: Small amount of assist for R LE onto bed. Increased time and effort. Practiced using gait belt a a leg lifter although she prefers to try to do it unassisted. ?  ? ?Transfers ?Overall transfer level: Needs  assistance ?Equipment used: Rolling walker (2 wheels) ?Transfers: Sit to/from Stand ?Sit to Stand: Min guard ?  ?  ?  ?  ?  ?General transfer comment: Cues for safety, hand placement. Increased time. ?  ? ?Ambulation/Gait ?Ambulation/Gait assistance: Min guard ?Gait Distance (Feet): 30 Feet ?Assistive device: Rolling walker (2 wheels) ?Gait Pattern/deviations: Antalgic, Decreased stance time - right, Decreased step length - left, Step-to pattern ?  ?  ?  ?General Gait Details: Cues for safety, technique, sequence, proper use of RW. Min guard for safety. Distance limited by pain, fatigue. ? ? ?Stairs ?Stairs: Yes ?Stairs assistance: Min guard ?Stair Management: Step to pattern, Forwards, One rail Left, With crutches ?Number of Stairs: 2 ?General stair comments: up and over portable stairs x 1. cues for safety, technique, sequence. Pt used 1 rail, 1 crutch. Min guard assist. Husband present to observe and practice. Improved tolerance ? ? ?Wheelchair Mobility ?  ? ?Modified Rankin (Stroke Patients Only) ?  ? ? ?  ?Balance Overall balance assessment: Needs assistance ?  ?  ?  ?  ?Standing balance support: Bilateral upper extremity supported, During functional activity, Reliant on assistive device for balance ?Standing balance-Leahy Scale: Fair ?  ?  ?  ?  ?  ?  ?  ?  ?  ?  ?  ?  ?  ? ?  ?Cognition Arousal/Alertness: Awake/alert ?Behavior During Therapy: [facility] for tasks assessed/performed ?Overall Cognitive Status: Within Functional Limits for tasks assessed ?  ?  ?  ?  ?  ?  ?  ?  ?  ?  ?  ?  ?  ?  ?  ?  ?  ?  ?  ? ?  ?  Exercises   ? ?  ?General Comments   ?  ?  ? ?Pertinent Vitals/Pain Pain Assessment ?Pain Assessment: 0-10 ?Pain Score: 7  ?Pain Location: R pelvis with activity ?Pain Descriptors / Indicators: Discomfort, Grimacing, Sharp, Guarding, Moaning ?Pain Intervention(s): Limited activity within patient's tolerance, Monitored during session, Repositioned, Premedicated before session  ? ? ?Home Living   ?  ?  ?  ?   ?  ?  ?  ?  ?  ?   ?  ?Prior Function    ?  ?  ?   ? ?PT Goals (current goals can now be found in the care plan section) Progress towards PT goals: Progressing toward goals ? ?  ?Frequency ? ? ? Min 5X/week ? ? ? ?  ?PT Plan Current plan remains appropriate  ? ? ?Co-evaluation   ?  ?  ?  ?  ? ?  ?AM-PAC PT "6 Clicks" Mobility   ?Outcome Measure ? Help needed turning from your back to your side while in a flat bed without using bedrails?: A Little ?Help needed moving from lying on your back to sitting on the side of a flat bed without using bedrails?: A Little ?Help needed moving to and from a bed to a chair (including a wheelchair)?: A Little ?Help needed standing up from a chair using your arms (e.g., wheelchair or bedside chair)?: A Little ?Help needed to walk in hospital room?: A Little ?Help needed climbing 3-5 steps with a railing? : A Little ?6 Click Score: 18 ? ?  ?End of Session Equipment Utilized During Treatment: Gait belt ?Activity Tolerance: Patient limited by fatigue;Patient limited by pain ?Patient left: in bed;with call bell/phone within reach;with family/visitor present ?  ?PT Visit Diagnosis: Unsteadiness on feet (R26.81);Pain;Difficulty in walking, not elsewhere classified (R26.2) ?Pain - Right/Left: Right ?Pain - part of body: Hip ?  ? ? ?Time: 5809-9833 ?PT Time Calculation (min) (ACUTE ONLY): 20 min ? ?Charges:  $Gait Training: 8-22 mins          ?          ? ? ? ? ?Doreatha Massed, PT ?Acute Rehabilitation  ?Office: [phone] ?Pager: [phone] ? ?  ? ?

## 2022-01-15 NOTE — Discharge Summary (Signed)
Physician Discharge Summary  ?[name] LPF:[id number] DOB: [birth year] DOA: 01/13/2022 ? ?PCP: Midge Minium, MD ? ?Admit date: 01/13/2022 ?Discharge date: 01/16/2022 ? ?Admitted From: Home ?Disposition: Home ? ?Recommendations for Outpatient Follow-up:  ?Follow up with PCP  ?Outpatient follow-up with orthopedics/Dr. Kathaleen Bury.   ?Follow up in ED if symptoms worsen or new appear ? ? ?Home Health: Home health PT ?Equipment/Devices: None ? ?Discharge Condition: Stable ?CODE STATUS: Full ?Diet recommendation: Heart healthy ? ?Brief/Interim Summary: ?76 y.o. female with medical history significant of hypothyroidism presented with fall and right hip pain.  CT of the pelvis showed right superior and inferior pubic ramus fracture along with pelvic hematoma and avascular necrosis of bilateral femoral heads.  Patient was unable to ambulate.  Orthopedics was consulted: Recommended conservative treatment with outpatient follow-up with orthopedics.  PT recommended home and PT.  She will be discharged home with home health PT today. ? ?Discharge Diagnoses:  ? ?Right superior and inferior pubic rami fractures ?Right pelvic hematoma ?Possible mechanical fall ?Severe right hip pain and ambulatory dysfunction ?Avascular necrosis of bilateral femoral heads ?-Orthopedics evaluated the patient on 01/13/2022: Recommended weightbearing as tolerated on the right lower extremity with walker.  Outpatient follow-up with orthopedics. ?-PT recommended home health PT. ?-Continue pain management on discharge.  Outpatient follow-up with PCP and orthopedics.  Discharge patient home today. ?  ?Hypokalemia ?-Replaced during hospitalization.  Outpatient follow-up of BMP.  We will keep Dyazide on hold till reevaluation with PCP. ?  ?Hypothyroidism ?-Continue Synthroid ?  ?Thrombocytopenia ?-Questionable cause.  Outpatient follow-up. ? ?Normocytic anemia ?-Hemoglobin stable.  Outpatient follow-up. ? ? ?Discharge Instructions ? ? ?Allergies as of  01/16/2022   ? ?   Reactions  ? Codeine Nausea And Vomiting  ? Formaldehyde Other (See Comments)  ? Per allergy testing, tested allergic  ? ?  ? ?  ?Medication List  ?  ? ?STOP taking these medications   ? ?triamterene-hydrochlorothiazide 37.5-25 MG capsule ?Commonly known as: DYAZIDE ?  ? ?  ? ?TAKE these medications   ? ?Aleve 220 MG tablet ?Generic drug: naproxen sodium ?Take 220-440 mg by mouth 2 (two) times daily as needed (for pain). ?  ?Biotin 5 MG Caps ?Take 5 mg by mouth daily. ?  ?CALCIUM + D3 PO ?Take 1 capsule by mouth 3 (three) times daily. ?  ?diclofenac Sodium 1 % Gel ?Commonly known as: VOLTAREN ?Apply 2 g topically 4 (four) times daily as needed (for soreness or pain). ?  ?Evening Primrose Oil Caps ?Take 2 capsules by mouth daily. ?  ?GLUCOSAMINE PO ?Take 3,000 mg by mouth daily. ?  ?HYDROcodone-acetaminophen 5-325 MG tablet ?Commonly known as: NORCO/VICODIN ?Take 1-2 tablets by mouth every 4 (four) hours as needed for moderate pain. ?  ?levothyroxine 50 MCG tablet ?Commonly known as: SYNTHROID ?Take 1 tablet (50 mcg total) by mouth daily before breakfast. ?  ?MILK THISTLE PO ?Take 1 tablet by mouth 2 (two) times daily. ?  ?multivitamin capsule ?Take 1 capsule by mouth daily. ?  ?OMEGA 3 PO ?Take 3,600 mg by mouth daily. ?  ?QUEtiapine 25 MG tablet ?Commonly known as: SEROQUEL ?TAKE 3 TABLETS BY MOUTH AT BEDTIME ?What changed:  ?how much to take ?how to take this ?when to take this ?additional instructions ?  ?senna-docusate 8.6-50 MG tablet ?Commonly known as: Senokot-S ?Take 1 tablet by mouth at bedtime as needed for mild constipation. ?  ?tiZANidine 4 MG tablet ?Commonly known as: ZANAFLEX ?TAKE 1 TABLET(4 MG) BY MOUTH AT  BEDTIME ?What changed: See the new instructions. ?  ?triamcinolone cream 0.1 % ?Commonly known as: KENALOG ?Apply 1 application. topically 2 (two) times daily as needed (to eczema flares). ?  ? ?  ? ? ? ? ? Follow-up Information   ? ? Armond Hang, MD.   ?Specialty:  Orthopedic Surgery ?Contact information: ?[address] ?[place] [id number] ?[phone] ? ? ?  ?  ? ?  ?  ? ?  ? ?Allergies  ?Allergen Reactions  ? Codeine Nausea And Vomiting  ? Formaldehyde Other (See Comments)  ?  Per allergy testing, tested allergic  ? ? ?Consultations: ?Orthopedics ? ? ?Procedures/Studies: ?CT PELVIS WO CONTRAST ? ?Result Date: 01/13/2022 ?CLINICAL DATA:  Post fall with persistent right-sided hip pain. Evaluate for fracture. EXAM: CT PELVIS WITHOUT CONTRAST TECHNIQUE: Multidetector CT imaging of the pelvis was performed following the standard protocol without intravenous contrast. RADIATION DOSE REDUCTION: This exam was performed according to the departmental dose-optimization program which includes automated exposure control, adjustment of the mA and/or kV according to patient size and/or use of iterative reconstruction technique. COMPARISON:  Right hip radiographs-earlier same day FINDINGS: Musculoskeletal: Acute fractures involving the medial aspects of the right superior (image 43, series 3) and inferior (image 46, series 3) pubic rami without extension to the pubic symphysis. This finding is also associated with a minimally displaced fracture involving the junction of the right superior pubic ramus and the anterior superior aspect of the acetabulum (images 33 and 38, series 6), without extension to the weight-bearing surface of the acetabulum. Expected hematoma about the fracture site, most conspicuously involving the right pelvic sidewall (image 39, series 3). Ill-defined stranding about the soft tissues lateral to the proximal femur (image 41, series 3). No radiopaque foreign body. No additional fractures are identified. Curvilinear sclerosis involving the weight-bearing surface of the bilateral femoral heads, right greater than left (representative axial images 34, 37 and 38, series 4 compatible with avascular necrosis without associated articular surface collapse or  secondary to early advanced degenerative change. Grade 1-2 anterolisthesis of L4 upon L5 measuring approximately 1 cm without associated pars defects. Severe DDD of L4-L5 with near complete disc space height loss, endplate irregularity and sclerosis. Severe bilateral facet degenerative change within the imaged lower lumbar spine. Other: Expected hematoma about the fracture site, most conspicuously involving the right pelvic sidewall (image 39, series 3). Ill-defined stranding about the soft tissues lateral to the proximal femur (image 41, series 3). No radiopaque foreign body. Urinary Tract: Normal appearance of the urinary bladder given degree distention. Bowel: Moderate colonic stool burden without evidence of enteric obstruction. Normal appearance of the terminal ileum and appendix. Vascular/Lymphatic: Minimal amount of atherosclerotic plaque. Incidentally noted mildly hypertrophied left gonadal vein. No bulky retroperitoneal, pelvic or inguinal lymphadenopathy. Reproductive:  Post hysterectomy.  No discrete adnexal lesions. IMPRESSION: 1. The examination is positive for acute minimally displaced fractures involving the medial aspects of the right superior and inferior pubic rami as well as the junction of the right superior pubic ramus and acetabulum without extension to the weight-bearing surface of the right hip. 2. Small, expected right pelvic sidewall hematoma and subcutaneous stranding about the proximal lateral aspect the right femur without radiopaque foreign body. 3. Avascular necrosis of the bilateral femoral heads, right greater than left, without associated articular surface collapse or advanced secondary degenerative change. Electronically Signed   By: Sandi Mariscal M.D.   On: 01/13/2022 10:40  ? ?DG Pelvis Comp Min 3V ? ?Result Date:  01/14/2022 ?CLINICAL DATA:  Pubic rami fractures EXAM: JUDET PELVIS - 3+ VIEW COMPARISON:  CT pelvis 01/13/2022 FINDINGS: Redemonstration of acute nondisplaced fractures  at the medial aspect of the right superior and inferior pubic rami. No additional fracture or dislocation identified. Curvilinear sclerosis at the superior right femoral head consistent with avascular necrosi

## 2022-01-15 NOTE — Progress Notes (Signed)
Physical Therapy Treatment ?Patient Details ?Name: [name] ?MRN: [id number] ?DOB: April 21, 1946 ?Today's Date: 01/15/2022 ? ? ?History of Present Illness 76 yo female admitted with R pubic ramus fx after falling at home. Hx of OA, osteopenia, bil TKA, hypothyroidism ? ?  ?PT Comments  ? ? Pt continues to mobilize at Lindy guard assist level. The limiting factor is pain control. Pt rated pain 10/10 (and was tearful) after participating with therapy today. Pt stated pain is "fine" at rest. The issue is pain control with mobility. Discussed d/c plan-do not feel pt needs to go to a SNF-(her husband is able to assist her and she prefers to d/c home as well). However, I think adequate pain control is still a problem. Will continue to follow and progress activity as tolerated. Encouraged pt to discuss pain control options with RN/MD.   ?   ?Recommendations for follow up therapy are one component of a multi-disciplinary discharge planning process, led by the attending physician.  Recommendations may be updated based on patient status, additional functional criteria and insurance authorization. ? ?Follow Up Recommendations ? Home health PT (pt doesn't want to go to a SNF-she prefers home) ?  ?  ?Assistance Recommended at Discharge Frequent or constant Supervision/Assistance  ?Patient can return home with the following A little help with walking and/or transfers;A little help with bathing/dressing/bathroom;Assistance with cooking/housework;Assist for transportation;Help with stairs or ramp for entrance ?  ?Equipment Recommendations ? BSC/3in1 (pt stated she has a RW already; issued crutches for stairs)  ?  ?Recommendations for Other Services   ? ? ?  ?Precautions / Restrictions Precautions ?Precautions: Fall ?Restrictions ?Weight Bearing Restrictions: No ?RLE Weight Bearing: Weight bearing as tolerated  ?  ? ?Mobility ? Bed Mobility ?  ?  ?  ?  ?  ?  ?  ?General bed mobility comments: oob in recliner ?  ? ?Transfers ?Overall  transfer level: Needs assistance ?Equipment used: Rolling walker (2 wheels) ?Transfers: Sit to/from Stand ?Sit to Stand: Min guard ?  ?  ?  ?  ?  ?General transfer comment: Cues for safety, hand placement. Increased time. ?  ? ?Ambulation/Gait ?Ambulation/Gait assistance: Min guard ?Gait Distance (Feet): 30 Feet ?Assistive device: Rolling walker (2 wheels) ?Gait Pattern/deviations: Antalgic, Decreased stance time - right, Decreased step length - left, Step-to pattern ?  ?  ?  ?General Gait Details: Cues for safety, technique, sequence, proper use of RW. Min guard for safety. Distance limited by pain, fatigue. 1 standing rest break after 15 feet. ? ? ?Stairs ?Stairs: Yes ?  ?  ?Number of Stairs: 2 ?General stair comments: up and over portable stairs x 1. cues for safety, technique, sequence. pt used 1 rail, 1 crutch. Min guard assist. Husband present to observe. Pt tearful after stairs ? ? ?Wheelchair Mobility ?  ? ?Modified Rankin (Stroke Patients Only) ?  ? ? ?  ?Balance Overall balance assessment: Needs assistance ?  ?  ?  ?  ?Standing balance support: Bilateral upper extremity supported, During functional activity, Reliant on assistive device for balance ?Standing balance-Leahy Scale: Fair ?  ?  ?  ?  ?  ?  ?  ?  ?  ?  ?  ?  ?  ? ?  ?Cognition Arousal/Alertness: Awake/alert ?Behavior During Therapy: [facility] for tasks assessed/performed ?Overall Cognitive Status: Within Functional Limits for tasks assessed ?  ?  ?  ?  ?  ?  ?  ?  ?  ?  ?  ?  ?  ?  ?  ?  ?  ?  ?  ? ?  ?  Exercises   ? ?  ?General Comments   ?  ?  ? ?Pertinent Vitals/Pain Pain Assessment ?Pain Assessment: 0-10 ?Pain Score: 10-Worst pain ever ?Pain Location: R pelvis with ambulation ?Pain Descriptors / Indicators: Discomfort, Grimacing, Operative site guarding, Sharp, Crying ?Pain Intervention(s): Limited activity within patient's tolerance, Monitored during session, Ice applied, Repositioned  ? ? ?Home Living   ?  ?  ?  ?  ?  ?  ?  ?  ?  ?   ?  ?Prior  Function    ?  ?  ?   ? ?PT Goals (current goals can now be found in the care plan section) Progress towards PT goals: Progressing toward goals ? ?  ?Frequency ? ? ? Min 5X/week ? ? ? ?  ?PT Plan Current plan remains appropriate  ? ? ?Co-evaluation   ?  ?  ?  ?  ? ?  ?AM-PAC PT "6 Clicks" Mobility   ?Outcome Measure ? Help needed turning from your back to your side while in a flat bed without using bedrails?: A Little ?Help needed moving from lying on your back to sitting on the side of a flat bed without using bedrails?: A Little ?Help needed moving to and from a bed to a chair (including a wheelchair)?: A Little ?Help needed standing up from a chair using your arms (e.g., wheelchair or bedside chair)?: A Little ?Help needed to walk in hospital room?: A Little ?Help needed climbing 3-5 steps with a railing? : A Little ?6 Click Score: 18 ? ?  ?End of Session Equipment Utilized During Treatment: Gait belt ?Activity Tolerance: Patient limited by fatigue;Patient limited by pain ?Patient left: in bed;with call bell/phone within reach;with family/visitor present ?  ?PT Visit Diagnosis: Unsteadiness on feet (R26.81);Pain;Difficulty in walking, not elsewhere classified (R26.2) ?Pain - Right/Left: Right ?Pain - part of body: Hip ?  ? ? ?Time: 6067-7034 ?PT Time Calculation (min) (ACUTE ONLY): 33 min ? ?Charges:  $Gait Training: 23-37 mins          ?          ? ? ? ? ?Doreatha Massed, PT ?Acute Rehabilitation  ?Office: [phone] ?Pager: [phone] ? ?  ? ?

## 2022-01-15 NOTE — Plan of Care (Signed)
  Problem: Activity: Goal: Risk for activity intolerance will decrease Outcome: Progressing   Problem: Safety: Goal: Ability to remain free from injury will improve Outcome: Progressing   

## 2022-01-15 NOTE — Progress Notes (Signed)
?PROGRESS NOTE ? ? ? ?[name]  IDP:[id number] DOB: 01-06-1946 DOA: 01/13/2022 ?PCP: Midge Minium, MD  ? ?Brief Narrative:  ? 76 y.o. female with medical history significant of hypothyroidism presented with fall and right hip pain.  CT of the pelvis showed right superior and inferior pubic ramus fracture along with pelvic hematoma and avascular necrosis of bilateral femoral heads.  Patient was unable to ambulate.  Orthopedics was consulted. ? ?Assessment & Plan: ?  ?  ?Right superior and inferior pubic rami fractures ?Right pelvic hematoma ?Possible mechanical fall ?Severe right hip pain and ambulatory dysfunction ?Avascular necrosis of bilateral femoral heads ?-Still complains of significant pain: Continue pain management.  Fall precautions.  PT recommending HHPT. Patient hopeful to work with PT again today to decide if she can go home.  If she is in significant pain today as well, she probably should consider SNF placement.  Currently hesitant about SNF. ?-Orthopedics evaluated the patient on 01/13/2022: Recommended weightbearing as tolerated on the right lower extremity with walker.  Outpatient follow-up with orthopedics. ?  ?Hypokalemia ?-Replace.  No labs today. ? ?Hypothyroidism ?-Continue Synthroid ? ?Thrombocytopenia ?-Questionable cause.  No signs of bleeding.  No labs today ? ?Normocytic anemia ?-Hemoglobin stable. ?  ?DVT prophylaxis: Lovenox ?Code Status: DNR ?Family Communication: None at bedside ?Disposition Plan: ?Status is: Observation ?Possible discharge home after PT reeval today.  Patient hopeful that she does better with PT today. ? ? ?Consultants: Orthopedics ? ?Procedures: None ? ?Antimicrobials: None ? ? ?Subjective: ?Patient seen and examined at bedside.  Still complains of right hip pain with minimal movement.  No fever, vomiting, worsening shortness of breath reported. ?Objective: ?Vitals:  ? 01/14/22 0424 01/14/22 1408 01/14/22 2139 01/15/22 0612  ?BP: (!) 110/57 125/61 (!)  135/59 119/67  ?Pulse: 76 80 82 87  ?Resp: '14 18 17 16  '$ ?Temp: 97.8 ?F (36.6 ?C) 98 ?F (36.7 ?C) 98.7 ?F (37.1 ?C) 98.4 ?F (36.9 ?C)  ?TempSrc: Oral Oral Oral   ?SpO2: 96% 99% 98% 98%  ?Weight:      ?Height:      ? ? ?Intake/Output Summary (Last 24 hours) at 01/15/2022 1112 ?Last data filed at 01/15/2022 0900 ?Gross per 24 hour  ?Intake 720 ml  ?Output --  ?Net 720 ml  ? ? ?Filed Weights  ? 01/13/22 0830  ?Weight: 56.7 kg  ? ? ?Examination: ? ?General exam: No distress.  On room air currently. ?Respiratory system: Decreased breath sounds at bases bilaterally, no wheezing cardiovascular system: Rate controlled currently; S1-S2 heard  ?gastrointestinal system: Abdomen is mildly distended, soft and nontender.  Bowel sounds are heard  ?extremities: No edema or clubbing ? ?Data Reviewed: I have personally reviewed following labs and imaging studies ? ?CBC: ?Recent Labs  ?Lab 01/13/22 ?1305 01/14/22 ?0325  ?WBC 6.3 4.8  ?NEUTROABS 4.5  --   ?HGB 13.4 11.7*  ?HCT 37.0 34.0*  ?MCV 95.9 97.1  ?PLT 168 143*  ? ? ?Basic Metabolic Panel: ?Recent Labs  ?Lab 01/13/22 ?1305 01/14/22 ?0325  ?NA 135 136  ?K 3.4* 3.2*  ?CL 101 103  ?CO2 24 24  ?GLUCOSE 94 88  ?BUN 19 18  ?CREATININE 0.65 0.61  ?CALCIUM 9.2 8.5*  ?MG  --  2.5*  ? ? ?GFR: ?Estimated Creatinine Clearance: 50.3 mL/min (by C-G formula based on SCr of 0.61 mg/dL). ?Liver Function Tests: ?Recent Labs  ?Lab 01/13/22 ?1305 01/14/22 ?0325  ?AST 15 12*  ?ALT 28 21  ?ALKPHOS 41 31*  ?BILITOT  1.0 0.7  ?PROT 6.8 5.6*  ?ALBUMIN 4.2 3.3*  ? ? ?No results for input(s): LIPASE, AMYLASE in the last 168 hours. ?No results for input(s): AMMONIA in the last 168 hours. ?Coagulation Profile: ?No results for input(s): INR, PROTIME in the last 168 hours. ?Cardiac Enzymes: ?No results for input(s): CKTOTAL, CKMB, CKMBINDEX, TROPONINI in the last 168 hours. ?BNP (last 3 results) ?No results for input(s): PROBNP in the last 8760 hours. ?HbA1C: ?No results for input(s): HGBA1C in the last 72  hours. ?CBG: ?No results for input(s): GLUCAP in the last 168 hours. ?Lipid Profile: ?No results for input(s): CHOL, HDL, LDLCALC, TRIG, CHOLHDL, LDLDIRECT in the last 72 hours. ?Thyroid Function Tests: ?No results for input(s): TSH, T4TOTAL, FREET4, T3FREE, THYROIDAB in the last 72 hours. ?Anemia Panel: ?No results for input(s): VITAMINB12, FOLATE, FERRITIN, TIBC, IRON, RETICCTPCT in the last 72 hours. ?Sepsis Labs: ?No results for input(s): PROCALCITON, LATICACIDVEN in the last 168 hours. ? ?No results found for this or any previous visit (from the past 240 hour(s)).  ? ? ? ? ? ?Radiology Studies: ?DG Pelvis Comp Min 3V ? ?Result Date: 01/14/2022 ?CLINICAL DATA:  Pubic rami fractures EXAM: JUDET PELVIS - 3+ VIEW COMPARISON:  CT pelvis 01/13/2022 FINDINGS: Redemonstration of acute nondisplaced fractures at the medial aspect of the right superior and inferior pubic rami. No additional fracture or dislocation identified. Curvilinear sclerosis at the superior right femoral head consistent with avascular necrosis changes. IMPRESSION: 1. Acute nondisplaced fractures of the medial right superior and inferior pubic rami. 2. Changes of avascular necrosis at the right femoral head. Electronically Signed   By: Ofilia Neas M.D.   On: 01/14/2022 13:52   ? ? ? ? ? ?Scheduled Meds: ? enoxaparin (LOVENOX) injection  40 mg Subcutaneous Q24H  ? levothyroxine  50 mcg Oral QAC breakfast  ? QUEtiapine  75 mg Oral QHS  ? tiZANidine  4 mg Oral QHS  ? ?Continuous Infusions: ? ? ? ? ? ? ? ?Aline August, MD ?[facility] ?01/15/2022, 11:12 AM  ? ?

## 2022-01-16 DIAGNOSIS — E039 Hypothyroidism, unspecified: Secondary | ICD-10-CM | POA: Diagnosis not present

## 2022-01-16 DIAGNOSIS — S32591A Other specified fracture of right pubis, initial encounter for closed fracture: Secondary | ICD-10-CM | POA: Diagnosis not present

## 2022-01-16 DIAGNOSIS — D696 Thrombocytopenia, unspecified: Secondary | ICD-10-CM

## 2022-01-16 DIAGNOSIS — E876 Hypokalemia: Secondary | ICD-10-CM | POA: Diagnosis not present

## 2022-01-16 DIAGNOSIS — D649 Anemia, unspecified: Secondary | ICD-10-CM | POA: Diagnosis not present

## 2022-01-16 MED ORDER — SENNOSIDES-DOCUSATE SODIUM 8.6-50 MG PO TABS
1.0000 | ORAL_TABLET | Freq: Every evening | ORAL | 0 refills | Status: DC | PRN
Start: 2022-01-16 — End: 2022-05-30

## 2022-01-16 MED ORDER — HYDROCODONE-ACETAMINOPHEN 5-325 MG PO TABS: 1.0000 | ORAL_TABLET | ORAL | 0 refills | Status: DC | PRN

## 2022-01-16 NOTE — Plan of Care (Signed)
  Problem: Pain Managment: Goal: General experience of comfort will improve Outcome: Progressing   Problem: Safety: Goal: Ability to remain free from injury will improve Outcome: Progressing   

## 2022-01-16 NOTE — Progress Notes (Signed)
Physical Therapy Treatment ?Patient Details ?Name: [name] ?MRN: [id number] ?DOB: 1945-12-22 ?Today's Date: 01/16/2022 ? ? ?History of Present Illness 76 yo female admitted with R pubic ramus fx after falling at home. Hx of OA, osteopenia, bil TKA, hypothyroidism ? ?  ?PT Comments  ? ? Improved pain control and activity tolerance this session. Reviewed/practiced gait and stair training. All education completed. Pt and husband feel comfortable discharging home on today.    ?Recommendations for follow up therapy are one component of a multi-disciplinary discharge planning process, led by the attending physician.  Recommendations may be updated based on patient status, additional functional criteria and insurance authorization. ? ?Follow Up Recommendations ? Home health PT ?  ?  ?Assistance Recommended at Discharge Frequent or constant Supervision/Assistance  ?Patient can return home with the following A little help with walking and/or transfers;A little help with bathing/dressing/bathroom;Assistance with cooking/housework;Assist for transportation;Help with stairs or ramp for entrance ?  ?Equipment Recommendations ? BSC/3in1  ?  ?Recommendations for Other Services   ? ? ?  ?Precautions / Restrictions Precautions ?Precautions: Fall ?Restrictions ?Weight Bearing Restrictions: No ?RLE Weight Bearing: Weight bearing as tolerated  ?  ? ?Mobility ? Bed Mobility ?Overal bed mobility: Needs Assistance ?Bed Mobility: Supine to Sit, Sit to Supine ?  ?  ?Supine to sit: Supervision, HOB elevated ?Sit to supine: Supervision, HOB elevated ?  ?  ?  ? ?Transfers ?Overall transfer level: Needs assistance ?Equipment used: Rolling walker (2 wheels) ?Transfers: Sit to/from Stand ?Sit to Stand: Supervision ?  ?  ?  ?  ?  ?  ?  ? ?Ambulation/Gait ?Ambulation/Gait assistance: Supervision ?Gait Distance (Feet): 30 Feet ?Assistive device: Rolling walker (2 wheels) ?Gait Pattern/deviations: Antalgic, Decreased stance time - right,  Decreased step length - left, Step-to pattern ?  ?  ?  ?General Gait Details: Supv for safety. ? ? ?Stairs ?Stairs: Yes ?Supervision  ?  ?Number of Stairs: 2 ?General stair comments: up and over portable stairs x 1. cues for safety, technique, sequence. Pt used 1 rail, 1 crutch. Min guard assist. Husband present to observe and practice. Improved tolerance ? ? ?Wheelchair Mobility ?  ? ?Modified Rankin (Stroke Patients Only) ?  ? ? ?  ?Balance Overall balance assessment: Needs assistance ?  ?  ?  ?  ?Standing balance support: Bilateral upper extremity supported, During functional activity, Reliant on assistive device for balance ?Standing balance-Leahy Scale: Fair ?  ?  ?  ?  ?  ?  ?  ?  ?  ?  ?  ?  ?  ? ?  ?Cognition Arousal/Alertness: Awake/alert ?Behavior During Therapy: [facility] for tasks assessed/performed ?Overall Cognitive Status: Within Functional Limits for tasks assessed ?  ?  ?  ?  ?  ?  ?  ?  ?  ?  ?  ?  ?  ?  ?  ?  ?  ?  ?  ? ?  ?Exercises   ? ?  ?General Comments   ?  ?  ? ?Pertinent Vitals/Pain Pain Assessment ?Pain Assessment: 0-10 ?Pain Score: 7  ?Pain Location: R pelvis with activity ?Pain Descriptors / Indicators: Discomfort, Grimacing, Sharp, Guarding, Moaning ?Pain Intervention(s): Limited activity within patient's tolerance, Monitored during session, Repositioned  ? ? ?Home Living   ?  ?  ?  ?  ?  ?  ?  ?  ?  ?   ?  ?Prior Function    ?  ?  ?   ? ?  PT Goals (current goals can now be found in the care plan section) Progress towards PT goals: Progressing toward goals ? ?  ?Frequency ? ? ? Min 5X/week ? ? ? ?  ?PT Plan Current plan remains appropriate  ? ? ?Co-evaluation   ?  ?  ?  ?  ? ?  ?AM-PAC PT "6 Clicks" Mobility   ?Outcome Measure ? Help needed turning from your back to your side while in a flat bed without using bedrails?: A Little ?Help needed moving from lying on your back to sitting on the side of a flat bed without using bedrails?: A Little ?Help needed moving to and from a bed to a chair  (including a wheelchair)?: A Little ?Help needed standing up from a chair using your arms (e.g., wheelchair or bedside chair)?: A Little ?Help needed to walk in hospital room?: A Little ?Help needed climbing 3-5 steps with a railing? : A Little ?6 Click Score: 18 ? ?  ?End of Session Equipment Utilized During Treatment: Gait belt ?Activity Tolerance: Patient limited by fatigue;Patient limited by pain ?Patient left: in bed;with call bell/phone within reach;with family/visitor present ?  ?PT Visit Diagnosis: Unsteadiness on feet (R26.81);Pain;Difficulty in walking, not elsewhere classified (R26.2) ?Pain - Right/Left: Right ?Pain - part of body: Hip ?  ? ? ?Time: 9509-3267 ?PT Time Calculation (min) (ACUTE ONLY): 14 min ? ?Charges:  $Gait Training: 8-22 mins          ?          ? ? ? ? ? ?Doreatha Massed, PT ?Acute Rehabilitation  ?Office: [phone] ?Pager: [phone] ? ?  ? ?

## 2022-01-16 NOTE — Progress Notes (Signed)
Discharge package printed and instructions given to patient. Patient verbalizes understanding. 

## 2022-01-17 ENCOUNTER — Telehealth: Payer: Self-pay

## 2022-01-17 NOTE — Telephone Encounter (Signed)
Patient is calling in stating that she was seen in the ED. They took labs and noticed her potassium levels are low so they took her off a medication. Patient is very concerned and was advised to follow up with PCP. Jonte did not disclose name of prescription, but would like a call back.  ?

## 2022-01-17 NOTE — Telephone Encounter (Signed)
Spoke to patient and she states that she was taken off of a medication after being d/c from the hospital.  ?STOP taking these medications   ?  ?triamterene-hydrochlorothiazide 37.5-25 MG capsule ?Commonly known as: DYAZIDE ?   ? ?She was told she needed to stop this medication because her potassium was low. This was prescribed by Endo, Dr Gale Journey. She was concerned about stopping that medication and what could happen if she did. I advised her to give her endo provider a call since he prescribed it to see what he advises first since Dr Birdie Riddle is out of the office. I did let her know that I would send to her and on call providers as well.  ?

## 2022-01-17 NOTE — Telephone Encounter (Signed)
This is a blood pressure medication. She should have a nurse visit to check on her BP with [place] tomorrow or Friday to ensure her BP is steady. Low salt diet and foods high in potassium in interim - leafy greens, fruits and veggies. She has appt with PCP next week - keep that to further manage htn. ? ?Thanks, ? ?Rich

## 2022-01-18 NOTE — Telephone Encounter (Signed)
Patient states she will come back on her scheduled visit on 01/24/2022 ?

## 2022-01-18 NOTE — Telephone Encounter (Signed)
Pt stated will have checked at her appointment 01/24/22 with Dr Birdie Riddle ? ?

## 2022-01-18 NOTE — Telephone Encounter (Signed)
Noted  

## 2022-01-21 DIAGNOSIS — M87851 Other osteonecrosis, right femur: Secondary | ICD-10-CM | POA: Diagnosis not present

## 2022-01-21 DIAGNOSIS — D696 Thrombocytopenia, unspecified: Secondary | ICD-10-CM | POA: Diagnosis not present

## 2022-01-21 DIAGNOSIS — E039 Hypothyroidism, unspecified: Secondary | ICD-10-CM | POA: Diagnosis not present

## 2022-01-21 DIAGNOSIS — S32511D Fracture of superior rim of right pubis, subsequent encounter for fracture with routine healing: Secondary | ICD-10-CM | POA: Diagnosis not present

## 2022-01-21 DIAGNOSIS — M47816 Spondylosis without myelopathy or radiculopathy, lumbar region: Secondary | ICD-10-CM | POA: Diagnosis not present

## 2022-01-21 DIAGNOSIS — D649 Anemia, unspecified: Secondary | ICD-10-CM | POA: Diagnosis not present

## 2022-01-21 DIAGNOSIS — S32591D Other specified fracture of right pubis, subsequent encounter for fracture with routine healing: Secondary | ICD-10-CM | POA: Diagnosis not present

## 2022-01-21 DIAGNOSIS — K449 Diaphragmatic hernia without obstruction or gangrene: Secondary | ICD-10-CM | POA: Diagnosis not present

## 2022-01-21 DIAGNOSIS — M87852 Other osteonecrosis, left femur: Secondary | ICD-10-CM | POA: Diagnosis not present

## 2022-01-21 DIAGNOSIS — M5136 Other intervertebral disc degeneration, lumbar region: Secondary | ICD-10-CM | POA: Diagnosis not present

## 2022-01-21 DIAGNOSIS — Z96653 Presence of artificial knee joint, bilateral: Secondary | ICD-10-CM | POA: Diagnosis not present

## 2022-01-21 DIAGNOSIS — E876 Hypokalemia: Secondary | ICD-10-CM | POA: Diagnosis not present

## 2022-01-21 DIAGNOSIS — M4316 Spondylolisthesis, lumbar region: Secondary | ICD-10-CM | POA: Diagnosis not present

## 2022-01-21 DIAGNOSIS — Z9181 History of falling: Secondary | ICD-10-CM | POA: Diagnosis not present

## 2022-01-21 DIAGNOSIS — F5105 Insomnia due to other mental disorder: Secondary | ICD-10-CM | POA: Diagnosis not present

## 2022-01-22 DIAGNOSIS — S329XXA Fracture of unspecified parts of lumbosacral spine and pelvis, initial encounter for closed fracture: Secondary | ICD-10-CM | POA: Insufficient documentation

## 2022-01-24 ENCOUNTER — Encounter: Payer: Self-pay | Admitting: Family Medicine

## 2022-01-24 ENCOUNTER — Ambulatory Visit (INDEPENDENT_AMBULATORY_CARE_PROVIDER_SITE_OTHER): Payer: PPO | Admitting: Family Medicine

## 2022-01-24 VITALS — BP 120/64 | HR 86 | Temp 98.2°F | Resp 16

## 2022-01-24 DIAGNOSIS — S32591A Other specified fracture of right pubis, initial encounter for closed fracture: Secondary | ICD-10-CM

## 2022-01-24 DIAGNOSIS — D649 Anemia, unspecified: Secondary | ICD-10-CM | POA: Diagnosis not present

## 2022-01-24 DIAGNOSIS — R82994 Hypercalciuria: Secondary | ICD-10-CM | POA: Diagnosis not present

## 2022-01-24 DIAGNOSIS — E876 Hypokalemia: Secondary | ICD-10-CM | POA: Diagnosis not present

## 2022-01-24 LAB — CBC WITH DIFFERENTIAL/PLATELET
Basophils Absolute: 0 10*3/uL (ref 0.0–0.1)
Basophils Relative: 0.5 % (ref 0.0–3.0)
Eosinophils Absolute: 0.2 10*3/uL (ref 0.0–0.7)
Eosinophils Relative: 2.6 % (ref 0.0–5.0)
HCT: 36.7 % (ref 36.0–46.0)
Hemoglobin: 12.5 g/dL (ref 12.0–15.0)
Lymphocytes Relative: 21.8 % (ref 12.0–46.0)
Lymphs Abs: 1.4 10*3/uL (ref 0.7–4.0)
MCHC: 34.2 g/dL (ref 30.0–36.0)
MCV: 97 fl (ref 78.0–100.0)
Monocytes Absolute: 0.5 10*3/uL (ref 0.1–1.0)
Monocytes Relative: 7.9 % (ref 3.0–12.0)
Neutro Abs: 4.3 10*3/uL (ref 1.4–7.7)
Neutrophils Relative %: 67.2 % (ref 43.0–77.0)
Platelets: 394 10*3/uL (ref 150.0–400.0)
RBC: 3.78 Mil/uL — ABNORMAL LOW (ref 3.87–5.11)
RDW: 12.5 % (ref 11.5–15.5)
WBC: 6.4 10*3/uL (ref 4.0–10.5)

## 2022-01-24 LAB — BASIC METABOLIC PANEL
BUN: 16 mg/dL (ref 6–23)
CO2: 27 mEq/L (ref 19–32)
Calcium: 10 mg/dL (ref 8.4–10.5)
Chloride: 100 mEq/L (ref 96–112)
Creatinine, Ser: 0.66 mg/dL (ref 0.40–1.20)
GFR: 85.64 mL/min (ref >60.00)
Glucose, Bld: 93 mg/dL (ref 70–99)
Potassium: 4.2 mEq/L (ref 3.5–5.1)
Sodium: 137 mEq/L (ref 135–145)

## 2022-01-24 NOTE — Assessment & Plan Note (Signed)
Pt had large pelvic hematoma on CT.  Based on this, and extensive bruising of R upper thigh, will get CBC to assess for decreasing Hgb.  Pt expressed understanding and is in agreement w/ plan.  ?

## 2022-01-24 NOTE — Progress Notes (Signed)
? ?  Subjective:  ? ? Patient ID: [name], female    DOB: Oct 08, 1946, 76 y.o.   MRN: [id number] ? ?HPI ?Hospital f/u- pt was admitted 3/25-3/28 after a fall that fractured her R superior and inferior pubic ramus.  She also had a pelvic hematoma and was found to have avascular necrosis of both femoral heads.  She was treated conservatively and d/c'd home w/ HH PT.  During hospitalization, K+ was low at 3.2  Her triamterene HCTZ was held in setting of hypokalemia.  Pt is on Dyazide due to spilling calcium into urine (managed by Endo at [facility]).  Pt reports pain is bad but getting better.  Has oxycodone available to use as needed for pain.  Is doing twice weekly PT. ? ?No CP, SOB, palpitations, muscle spasms. ? ? ?Review of Systems ?For ROS see HPI  ?   ?Objective:  ? Physical Exam ?Vitals reviewed.  ?Constitutional:   ?   General: She is not in acute distress. ?   Appearance: Normal appearance. She is not ill-appearing.  ?HENT:  ?   Head: Normocephalic and atraumatic.  ?Cardiovascular:  ?   Pulses: Normal pulses.  ?Musculoskeletal:  ?   Right lower leg: No edema.  ?   Left lower leg: No edema.  ?Skin: ?   General: Skin is warm and dry.  ?Neurological:  ?   Mental Status: She is alert and oriented to person, place, and time.  ?   Cranial Nerves: No cranial nerve deficit.  ?   Gait: Gait abnormal.  ?Psychiatric:     ?   Mood and Affect: Mood normal.     ?   Behavior: Behavior normal.     ?   Thought Content: Thought content normal.  ? ? ? ? ? ?   ?Assessment & Plan:  ? ? ?

## 2022-01-24 NOTE — Patient Instructions (Signed)
Follow up as needed or as scheduled ?We'll notify you of your lab results and make any changes if needed ?Continue to work w/ PT but overdo it ?Alternate Tylenol and ibuprofen every 4 hrs to stay ahead of pain and then use Oxycodone as needed ?Call with any questions or concerns ?Hang in there!!! ?

## 2022-01-24 NOTE — Assessment & Plan Note (Signed)
New to provider.  Pt is doing remarkably well considering she has 2 fractures, a hematoma, and avascular necrosis of both hips.  She is using a walker for weight bearing and is participating in [facility] PT.  Has pain medicine available for use.  Encouraged her to alternate tylenol and ibuprofen and then use the Oxycodone as needed for severe pain.  Pt expressed understanding and is in agreement w/ plan.  ?

## 2022-01-24 NOTE — Assessment & Plan Note (Signed)
Managed by [facility].  Treated w/ Triamterene HCTZ but this was held due to hypokalemia in the hospital.  Will repeat potassium today and restart if appropriate.  If K+ remains low, will defer restart to [facility].  Pt expressed understanding and is in agreement w/ plan.  ?

## 2022-01-24 NOTE — Assessment & Plan Note (Signed)
Pt has been on Triamterene HCTZ due to hypercalciuria for years.  Her potassium was found to be low in the hospital so this medicine was stopped.  Last K+ was 3.2  Will repeat today.  She has appt upcoming at [facility] (who prescribes this medication) at the end of the month.  Depending on the potassium level, I will defer to him regarding restart.  Pt expressed understanding and is in agreement w/ plan.  ?

## 2022-01-24 NOTE — TOC Transition Note (Signed)
Transition of Care (TOC) - CM/SW Discharge Note ? ? ?Patient Details  ?Name: DEBI COUSIN ?MRN: [id number] ?Date of Birth: November 28, 1945 ? ?Transition of Care (TOC) CM/SW Contact:  ?Melani Brisbane, LCSW ?Phone Number: ?01/24/2022, 10:36 AM ? ? ?Clinical Narrative:    ? ?LATE ENTRY: ? ?Met with pt and confirmed she has all needed DME at home.  Orders placed for HHPT and pt agreeable with no agency preference.  Able to secure services with [facility] and information placed on AVS. ? ? ?  ?Barriers to Discharge: No Barriers Identified ? ? ?Patient Goals and CMS Choice ?Patient states their goals for this hospitalization and ongoing recovery are:: return home ?  ?  ? ?Discharge Placement ?  ?           ?  ?  ?  ?  ? ?Discharge Plan and Services ?  ?  ?           ?DME Arranged: N/A ?DME Agency: NA ?  ?  ?  ?HH Arranged: PT ?[place] Agency: [place] ?Date HH Agency Contacted: 01/16/22 ?Time Shorter: 1400 ?Representative spoke with at [place]: Tommi Rumps ? ?Social Determinants of Health (SDOH) Interventions ?  ? ? ?Readmission Risk Interventions ?   ? View : No data to display.  ?  ?  ?  ? ? ? ? ? ?

## 2022-01-30 ENCOUNTER — Inpatient Hospital Stay: Payer: PPO | Admitting: Registered Nurse

## 2022-02-02 DIAGNOSIS — S329XXA Fracture of unspecified parts of lumbosacral spine and pelvis, initial encounter for closed fracture: Secondary | ICD-10-CM | POA: Diagnosis not present

## 2022-02-02 DIAGNOSIS — M25551 Pain in right hip: Secondary | ICD-10-CM | POA: Insufficient documentation

## 2022-02-02 DIAGNOSIS — S32591D Other specified fracture of right pubis, subsequent encounter for fracture with routine healing: Secondary | ICD-10-CM | POA: Diagnosis not present

## 2022-02-05 ENCOUNTER — Encounter: Payer: Self-pay | Admitting: Plastic Surgery

## 2022-02-05 DIAGNOSIS — E876 Hypokalemia: Secondary | ICD-10-CM | POA: Diagnosis not present

## 2022-02-05 DIAGNOSIS — M87851 Other osteonecrosis, right femur: Secondary | ICD-10-CM | POA: Diagnosis not present

## 2022-02-05 DIAGNOSIS — M5136 Other intervertebral disc degeneration, lumbar region: Secondary | ICD-10-CM | POA: Diagnosis not present

## 2022-02-05 DIAGNOSIS — D649 Anemia, unspecified: Secondary | ICD-10-CM | POA: Diagnosis not present

## 2022-02-05 DIAGNOSIS — S32511D Fracture of superior rim of right pubis, subsequent encounter for fracture with routine healing: Secondary | ICD-10-CM | POA: Diagnosis not present

## 2022-02-05 DIAGNOSIS — Z9181 History of falling: Secondary | ICD-10-CM | POA: Diagnosis not present

## 2022-02-05 DIAGNOSIS — D696 Thrombocytopenia, unspecified: Secondary | ICD-10-CM | POA: Diagnosis not present

## 2022-02-05 DIAGNOSIS — S32591D Other specified fracture of right pubis, subsequent encounter for fracture with routine healing: Secondary | ICD-10-CM | POA: Diagnosis not present

## 2022-02-05 DIAGNOSIS — M87852 Other osteonecrosis, left femur: Secondary | ICD-10-CM | POA: Diagnosis not present

## 2022-02-05 DIAGNOSIS — F5105 Insomnia due to other mental disorder: Secondary | ICD-10-CM | POA: Diagnosis not present

## 2022-02-05 DIAGNOSIS — K449 Diaphragmatic hernia without obstruction or gangrene: Secondary | ICD-10-CM | POA: Diagnosis not present

## 2022-02-05 DIAGNOSIS — Z96653 Presence of artificial knee joint, bilateral: Secondary | ICD-10-CM | POA: Diagnosis not present

## 2022-02-05 DIAGNOSIS — M47816 Spondylosis without myelopathy or radiculopathy, lumbar region: Secondary | ICD-10-CM | POA: Diagnosis not present

## 2022-02-05 DIAGNOSIS — M4316 Spondylolisthesis, lumbar region: Secondary | ICD-10-CM | POA: Diagnosis not present

## 2022-02-05 DIAGNOSIS — E039 Hypothyroidism, unspecified: Secondary | ICD-10-CM | POA: Diagnosis not present

## 2022-02-08 DIAGNOSIS — S32591D Other specified fracture of right pubis, subsequent encounter for fracture with routine healing: Secondary | ICD-10-CM | POA: Diagnosis not present

## 2022-02-08 DIAGNOSIS — F5105 Insomnia due to other mental disorder: Secondary | ICD-10-CM | POA: Diagnosis not present

## 2022-02-08 DIAGNOSIS — E876 Hypokalemia: Secondary | ICD-10-CM | POA: Diagnosis not present

## 2022-02-08 DIAGNOSIS — S32511D Fracture of superior rim of right pubis, subsequent encounter for fracture with routine healing: Secondary | ICD-10-CM | POA: Diagnosis not present

## 2022-02-08 DIAGNOSIS — K449 Diaphragmatic hernia without obstruction or gangrene: Secondary | ICD-10-CM

## 2022-02-08 DIAGNOSIS — M87851 Other osteonecrosis, right femur: Secondary | ICD-10-CM | POA: Diagnosis not present

## 2022-02-08 DIAGNOSIS — M4316 Spondylolisthesis, lumbar region: Secondary | ICD-10-CM | POA: Diagnosis not present

## 2022-02-08 DIAGNOSIS — D649 Anemia, unspecified: Secondary | ICD-10-CM | POA: Diagnosis not present

## 2022-02-08 DIAGNOSIS — D696 Thrombocytopenia, unspecified: Secondary | ICD-10-CM | POA: Diagnosis not present

## 2022-02-08 DIAGNOSIS — M87852 Other osteonecrosis, left femur: Secondary | ICD-10-CM | POA: Diagnosis not present

## 2022-02-08 DIAGNOSIS — Z96653 Presence of artificial knee joint, bilateral: Secondary | ICD-10-CM

## 2022-02-08 DIAGNOSIS — Z9181 History of falling: Secondary | ICD-10-CM

## 2022-02-08 DIAGNOSIS — E039 Hypothyroidism, unspecified: Secondary | ICD-10-CM | POA: Diagnosis not present

## 2022-02-08 DIAGNOSIS — M47816 Spondylosis without myelopathy or radiculopathy, lumbar region: Secondary | ICD-10-CM | POA: Diagnosis not present

## 2022-02-08 DIAGNOSIS — M5136 Other intervertebral disc degeneration, lumbar region: Secondary | ICD-10-CM | POA: Diagnosis not present

## 2022-02-13 DIAGNOSIS — M8589 Other specified disorders of bone density and structure, multiple sites: Secondary | ICD-10-CM | POA: Diagnosis not present

## 2022-02-17 ENCOUNTER — Encounter: Payer: PPO | Admitting: Plastic Surgery

## 2022-02-19 ENCOUNTER — Encounter: Payer: Self-pay | Admitting: Plastic Surgery

## 2022-02-19 ENCOUNTER — Ambulatory Visit (INDEPENDENT_AMBULATORY_CARE_PROVIDER_SITE_OTHER): Payer: Self-pay | Admitting: Plastic Surgery

## 2022-02-19 DIAGNOSIS — Z719 Counseling, unspecified: Secondary | ICD-10-CM

## 2022-02-19 NOTE — Progress Notes (Signed)
Botulinum Toxin Procedure Note ? ?Procedure: Cosmetic botulinum toxin ? ?Pre-operative Diagnosis: Dynamic rhytides  ? ?Post-operative Diagnosis: Same ? ?Complications:  None ? ?Brief history: The patient desires botulinum toxin injection of her forehead. I discussed with the patient this proposed procedure of botulinum toxin injections, which is customized depending on the particular needs of the patient. It is performed on facial rhytids as a temporary correction. The alternatives were discussed with the patient. The risks were addressed including bleeding, scarring, infection, damage to deeper structures, asymmetry, and chronic pain, which may occur infrequently after a procedure. The individual's choice to undergo a surgical procedure is based on the comparison of risks to potential benefits. Other risks include unsatisfactory results, brow ptosis, eyelid ptosis, allergic reaction, temporary paralysis, which should go away with time, bruising, blurring disturbances and delayed healing. Botulinum toxin injections do not arrest the aging process or produce permanent tightening of the eyelid.  Operative intervention maybe necessary to maintain the results of a blepharoplasty or botulinum toxin. The patient understands and wishes to proceed. ? ?Procedure: The area was prepped with alcohol and dried with a clean gauze. Using a clean technique, the botulinum toxin was diluted with 1.25 cc of preservative-free normal saline which was slowly injected with an 18 gauge needle in a tuberculin syringes.  A 32 gauge needles were then used to inject the botulinum toxin. This mixture allow for an aliquot of 4 units per 0.1 cc in each injection site.   ? ?Subsequently the mixture was injected in the glabellar and forehead area with preservation of the temporal branch to the lateral eyebrow as well as into each lateral canthal area beginning from the lateral orbital rim medial to the zygomaticus major in 3 separate areas. A total  of 30 Units of botulinum toxin was used. The forehead and glabellar area was injected with care to inject intramuscular only while holding pressure on the supratrochlear vessels in each area during each injection on either side of the medial corrugators. The injection proceeded vertically superiorly to the medial 2/3 of the frontalis muscle and superior 2/3 of the lateral frontalis, again with preservation of the frontal branch.  No complications were noted. Light pressure was held for 5 minutes. She was instructed explicitly in post-operative care. ? ?Botox ?LOT:  [id number] ?EXP:  2025/04 ?

## 2022-02-23 DIAGNOSIS — S32591D Other specified fracture of right pubis, subsequent encounter for fracture with routine healing: Secondary | ICD-10-CM | POA: Diagnosis not present

## 2022-02-23 DIAGNOSIS — S329XXA Fracture of unspecified parts of lumbosacral spine and pelvis, initial encounter for closed fracture: Secondary | ICD-10-CM | POA: Diagnosis not present

## 2022-02-26 DIAGNOSIS — D649 Anemia, unspecified: Secondary | ICD-10-CM | POA: Diagnosis not present

## 2022-02-26 DIAGNOSIS — F5105 Insomnia due to other mental disorder: Secondary | ICD-10-CM | POA: Diagnosis not present

## 2022-02-26 DIAGNOSIS — S32511D Fracture of superior rim of right pubis, subsequent encounter for fracture with routine healing: Secondary | ICD-10-CM | POA: Diagnosis not present

## 2022-02-26 DIAGNOSIS — M5136 Other intervertebral disc degeneration, lumbar region: Secondary | ICD-10-CM | POA: Diagnosis not present

## 2022-02-26 DIAGNOSIS — M87852 Other osteonecrosis, left femur: Secondary | ICD-10-CM | POA: Diagnosis not present

## 2022-02-26 DIAGNOSIS — K449 Diaphragmatic hernia without obstruction or gangrene: Secondary | ICD-10-CM | POA: Diagnosis not present

## 2022-02-26 DIAGNOSIS — M47816 Spondylosis without myelopathy or radiculopathy, lumbar region: Secondary | ICD-10-CM | POA: Diagnosis not present

## 2022-02-26 DIAGNOSIS — Z96653 Presence of artificial knee joint, bilateral: Secondary | ICD-10-CM | POA: Diagnosis not present

## 2022-02-26 DIAGNOSIS — Z9181 History of falling: Secondary | ICD-10-CM | POA: Diagnosis not present

## 2022-02-26 DIAGNOSIS — E876 Hypokalemia: Secondary | ICD-10-CM | POA: Diagnosis not present

## 2022-02-26 DIAGNOSIS — S32591D Other specified fracture of right pubis, subsequent encounter for fracture with routine healing: Secondary | ICD-10-CM | POA: Diagnosis not present

## 2022-02-26 DIAGNOSIS — M87851 Other osteonecrosis, right femur: Secondary | ICD-10-CM | POA: Diagnosis not present

## 2022-02-26 DIAGNOSIS — E039 Hypothyroidism, unspecified: Secondary | ICD-10-CM | POA: Diagnosis not present

## 2022-02-26 DIAGNOSIS — M4316 Spondylolisthesis, lumbar region: Secondary | ICD-10-CM | POA: Diagnosis not present

## 2022-02-26 DIAGNOSIS — D696 Thrombocytopenia, unspecified: Secondary | ICD-10-CM | POA: Diagnosis not present

## 2022-03-07 DIAGNOSIS — H43813 Vitreous degeneration, bilateral: Secondary | ICD-10-CM | POA: Diagnosis not present

## 2022-03-12 DIAGNOSIS — S62616A Displaced fracture of proximal phalanx of right little finger, initial encounter for closed fracture: Secondary | ICD-10-CM | POA: Diagnosis not present

## 2022-03-12 DIAGNOSIS — M25551 Pain in right hip: Secondary | ICD-10-CM | POA: Diagnosis not present

## 2022-03-16 DIAGNOSIS — S32591D Other specified fracture of right pubis, subsequent encounter for fracture with routine healing: Secondary | ICD-10-CM | POA: Diagnosis not present

## 2022-03-16 DIAGNOSIS — S329XXA Fracture of unspecified parts of lumbosacral spine and pelvis, initial encounter for closed fracture: Secondary | ICD-10-CM | POA: Diagnosis not present

## 2022-03-20 DIAGNOSIS — M545 Low back pain, unspecified: Secondary | ICD-10-CM | POA: Insufficient documentation

## 2022-03-21 ENCOUNTER — Other Ambulatory Visit: Payer: Self-pay | Admitting: Orthopaedic Surgery

## 2022-03-21 ENCOUNTER — Ambulatory Visit
Admission: RE | Admit: 2022-03-21 | Discharge: 2022-03-21 | Disposition: A | Discharge status: Home / Self Care | Payer: PPO | Source: Ambulatory Visit | Attending: Orthopaedic Surgery | Admitting: Orthopaedic Surgery

## 2022-03-21 DIAGNOSIS — S72001D Fracture of unspecified part of neck of right femur, subsequent encounter for closed fracture with routine healing: Secondary | ICD-10-CM | POA: Diagnosis not present

## 2022-03-21 DIAGNOSIS — S329XXA Fracture of unspecified parts of lumbosacral spine and pelvis, initial encounter for closed fracture: Secondary | ICD-10-CM

## 2022-03-26 DIAGNOSIS — S62616D Displaced fracture of proximal phalanx of right little finger, subsequent encounter for fracture with routine healing: Secondary | ICD-10-CM | POA: Diagnosis not present

## 2022-03-26 DIAGNOSIS — M79644 Pain in right finger(s): Secondary | ICD-10-CM | POA: Insufficient documentation

## 2022-03-27 NOTE — Progress Notes (Signed)
Chronic Care Management Pharmacy Note  04/04/2022 Name:  [name] MRN:  [id number] DOB:  07/22/1946  Summary: No Rx changes, still recovering from fall.  Graduating from walker to her cane currently.  She is taking Actonel once per month for bones.  Due for DEXA August of 2024.  Subjective: [name] is an 76 y.o. year old female who is a primary patient of Tabori, Aundra Millet, MD.  The CCM team was consulted for assistance with disease management and care coordination needs.    Engaged with patient by telephone for follow up visit in response to provider referral for pharmacy case management and/or care coordination services.   Consent to Services:  The patient was given information about Chronic Care Management services, agreed to services, and gave verbal consent prior to initiation of services.  Please see initial visit note for detailed documentation.   Patient Care Team: Midge Minium, MD as PCP - General (Family Medicine) Huel Cote, NP (Inactive) as Nurse Practitioner (Obstetrics and Gynecology) Dennie Bible, NP as Nurse Practitioner (Family Medicine) Dohmeier, Asencion Partridge, MD as Consulting Physician (Neurology) Gale Journey, Gwen Her, MD as Referring Physician (Endocrinology) Kelton Pillar, MD as Referring Physician (Orthopedic Surgery) Harriett Sine, MD as Consulting Physician (Dermatology) [place], Heather, [place] as Consulting Physician (Optometry) Linton Rump, PT as Physical Therapist (Physical Therapy) Edythe Clarity [facility] (Pharmacist)  Hospital visits: 01/13/22 (ED) - fall and right hip pain - CT showed fracture along with pelvic hematoma.  Objective:  Lab Results  Component Value Date   CREATININE 0.66 01/24/2022   CREATININE 0.61 01/14/2022   CREATININE 0.65 01/13/2022    No results found for: "HGBA1C" Last diabetic Eye exam: No results found for: "HMDIABEYEEXA"  Last diabetic Foot exam: No results found for: "HMDIABFOOTEX"       Component Value Date/Time   CHOL 238 (H) 12/01/2020 1314   CHOL 265 (H) 10/27/2019 0947   CHOL 235 (H) 10/24/2018 0837   TRIG 92.0 12/01/2020 1314   TRIG 132.0 10/27/2019 0947   TRIG 78.0 10/24/2018 0837   HDL 107.60 12/01/2020 1314   HDL 108.10 10/27/2019 0947   HDL 112.10 10/24/2018 0837   CHOLHDL 2 12/01/2020 1314   VLDL 18.4 12/01/2020 1314   LDLCALC 112 (H) 12/01/2020 1314   LDLCALC 131 (H) 10/27/2019 0947   LDLCALC 108 (H) 10/24/2018 0837       Latest Ref Rng & Units 01/14/2022    3:25 AM 01/13/2022    1:05 PM 12/01/2020    1:14 PM  Hepatic Function  Total Protein 6.5 - 8.1 g/dL 5.6  6.8  7.8   Albumin 3.5 - 5.0 g/dL 3.3  4.2  4.9   AST 15 - 41 U/L _0 ALT 0 - 44 U/L _1 Alk Phosphatase 38 - 126 U/L 31  41  49   Total Bilirubin 0.3 - 1.2 mg/dL 0.7  1.0  0.8   Bilirubin, Direct 0.0 - 0.3 mg/dL   0.1     Lab Results  Component Value Date/Time   TSH 4.02 12/01/2020 01:14 PM   TSH 4.09 10/27/2019 09:47 AM   FREET4 0.67 06/15/2013 11:20 AM       Latest Ref Rng & Units 01/24/2022    2:44 PM 01/14/2022    3:25 AM 01/13/2022    1:05 PM  CBC  WBC 4.0 - 10.5 K/uL 6.4  4.8  6.3   Hemoglobin  12.0 - 15.0 g/dL 12.5  11.7  13.4   Hematocrit 36.0 - 46.0 % 36.7  34.0  37.0   Platelets 150.0 - 400.0 K/uL 394.0  143  168     Lab Results  Component Value Date/Time   VD25OH 35.19 12/01/2020 01:14 PM   VD25OH 32.47 10/27/2019 09:47 AM    Clinical ASCVD:  The ASCVD Risk score (Arnett DK, et al., 2019) failed to calculate for the following reasons:   The valid HDL cholesterol range is 20 to 100 mg/dL   Social History   Tobacco Use  Smoking Status Never  Smokeless Tobacco Never   BP Readings from Last 3 Encounters:  01/24/22 120/64  01/16/22 130/65  05/01/21 140/78   Pulse Readings from Last 3 Encounters:  01/24/22 86  01/16/22 78  05/01/21 78   Wt Readings from Last 3 Encounters:  01/13/22 125 lb (56.7 kg)  05/01/21 131 lb 6.4 oz (59.6 kg)   03/27/21 135 lb (61.2 kg)   BMI Readings from Last 3 Encounters:  01/13/22 22.14 kg/m  05/01/21 22.21 kg/m  03/27/21 24.30 kg/m    Assessment: Review of patient past medical history, allergies, medications, health status, including review of consultants reports, laboratory and other test data, was performed as part of comprehensive evaluation and provision of chronic care management services.   SDOH:  (Social Determinants of Health) assessments and interventions performed: Yes   CCM Care Plan  Allergies  Allergen Reactions   Codeine Nausea And Vomiting   Formaldehyde Other (See Comments)    Per allergy testing, tested allergic    Medications Reviewed Today     Reviewed by Edythe Clarity, [facility] (Pharmacist) on 04/04/22 at 1017  Med List Status: <None>   Medication Order Taking? Sig Documenting Provider Last Dose Status Informant  ALEVE 220 MG tablet [id number] Yes Take 220-440 mg by mouth 2 (two) times daily as needed (for pain). [provider] Taking Active Self  Biotin 5 MG CAPS [id number] Yes Take 5 mg by mouth daily. [provider] Taking Active Self  Calcium Carb-Cholecalciferol (CALCIUM + D3 PO) [id number] Yes Take 1 capsule by mouth 3 (three) times daily. [provider] Taking Active Self  diclofenac Sodium (VOLTAREN) 1 % GEL [id number] Yes Apply 2 g topically 4 (four) times daily as needed (for soreness or pain). [provider] Taking Active Self  Evening Primrose Oil CAPS [id number] Yes Take 2 capsules by mouth daily. [provider] Taking Active Self  GLUCOSAMINE PO [id number] Yes Take 3,000 mg by mouth daily. [provider] Taking Active Self  HYDROcodone-acetaminophen (NORCO/VICODIN) 5-325 MG tablet [id number] No Take 1-2 tablets by mouth every 4 (four) hours as needed for moderate pain.  Patient not taking: Reported on 04/04/2022   Aline August, MD Not Taking Active   levothyroxine (SYNTHROID) 50 MCG tablet  [id number] Yes Take 1 tablet (50 mcg total) by mouth daily before breakfast. Midge Minium, MD Taking Active Self  MILK THISTLE PO [id number] Yes Take 1 tablet by mouth 2 (two) times daily. [provider] Taking Active Self  Multiple Vitamin (MULTIVITAMIN) capsule [id number] Yes Take 1 capsule by mouth daily. [provider] Taking Active Self  Omega-3 Fatty Acids (OMEGA 3 PO) [id number] Yes Take 3,600 mg by mouth daily. [provider] Taking Active Self  oxyCODONE (OXY IR/ROXICODONE) 5 MG immediate release tablet [id number] No oxycodone 5 mg tablet  Take 1 tablet every 6 hours by oral route for 7 days.  Patient not  taking: Reported on 04/04/2022   [provider] Not Taking Active   QUEtiapine (SEROQUEL) 25 MG tablet [id number] Yes TAKE 3 TABLETS BY MOUTH AT BEDTIME  Patient taking differently: Take 75 mg by mouth at bedtime.   Ward Givens, NP Taking Active Self  risedronate (ACTONEL) 150 MG tablet [id number] Yes Take 150 mg by mouth every 30 (thirty) days. with water on empty stomach, nothing by mouth or lie down for next 30 minutes. [provider] Taking Active   senna-docusate (SENOKOT-S) 8.6-50 MG tablet [id number] Yes Take 1 tablet by mouth at bedtime as needed for mild constipation. Aline August, MD Taking Active   tiZANidine (ZANAFLEX) 4 MG tablet [id number] No TAKE 1 TABLET(4 MG) BY MOUTH AT BEDTIME  Patient not taking: Reported on 04/04/2022   Ward Givens, NP Not Taking Active Self  triamcinolone cream (KENALOG) 0.1 % [id number] Yes Apply 1 application. topically 2 (two) times daily as needed (to eczema flares). 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/23/2013   Insomnia due to anxiety and fear    Chest pain 06/17/2013   GERD (gastroesophageal reflux disease) 05/04/2013   Displacement of cervical intervertebral disc without myelopathy 03/12/2013   Osteopenia    Endometriosis     Immunization History  Administered Date(s) Administered   Fluad Quad(high Dose 65+) 07/16/2021   Influenza, High Dose Seasonal PF 07/17/2017, 07/13/2018, 06/10/2019, 08/06/2020   Influenza,inj,Quad PF,6+ Mos 07/11/2016   Influenza-Unspecified 08/12/2014, 07/19/2015   PFIZER Comirnaty(Gray Top)Covid-19 Tri-Sucrose Vaccine 02/13/2021   PFIZER(Purple Top)SARS-COV-2 Vaccination 11/11/2019, 12/02/2019, 06/26/2020   PNEUMOCOCCAL CONJUGATE-20 04/17/2021   Pfizer Covid-19 Vaccine Bivalent Booster 11yr & up 07/16/2021   Pneumococcal Conjugate-13 08/12/2014   Pneumococcal Polysaccharide-23 09/10/2012, 10/31/2019   Tdap 08/12/2014   Zoster Recombinat (Shingrix) 09/02/2017, 09/27/2019   Zoster, Live 01/18/2012    Conditions to be addressed/monitored: Insomnia hypothyroid GERD Osteopenia   Care Plan : ccm pharmacy care plan  Updates made by DEdythe Clarity RPH since 04/04/2022 12:00 AM     Problem: Insomnia hypothyroid GERD Osteopenia   Priority: High     Long-Range Goal: disease management   Start Date: 08/02/2021  Expected End Date: 08/02/2022  Recent Progress: On track  Priority: High  Note:    Pharmacist Clinical Goal(s):  Patient will contact provider office for questions/concerns as evidenced notation of same in electronic health record through collaboration with PharmD and provider.   Interventions: 1:1 collaboration with TMidge Minium MD regarding development and update of comprehensive plan of care as evidenced by provider attestation and  co-signature Inter-disciplinary care team collaboration (see longitudinal plan of care) Comprehensive medication review performed; medication list updated in electronic medical record  Insomnia (Goal: optimize medication safety, ensure good sleep hygiene) -Controlled -Wakes up in middle of the night then goes back to sleep - has done this entire life. Feeling well rested most days, no interference with daily activities. -No daytime naps  -Current treatment: Quetiapine 75 mg once daily before bed Appropriate, Effective, Safe, Accessible -Educated on Benefits  of medication for symptom control Reviewed for side effects - no problems noted  -Recommended to continue current medication    Osteopenia (Goal ensure appropriate supplementation) -Controlled -Last DEXA Scan: 05/2021 - osteopenia. Low fall risk  -Very active. Does piliates, works out with trainer 2x/wk -Patient is not a candidate for pharmacologic treatment -Current treatment  Vitamin D3-Calcium supplement once daily Risedronate 120m once per month Appropriate, Effective, Safe, Accessible -Medications previously tried: n/a  -Recommend [phone] units of vitamin D daily. Recommend 1200 mg of calcium daily from dietary and supplemental sources. Recommend weight-bearing and muscle strengthening exercises for building and maintaining bone density. -Recommended to continue current medication  Update 04/04/22 Has taken ever since her fall. She reports compliance with this. Due for updated DEXA scan August 2024. Until then cautioned her to be careful/avoid falls. She is recovering from fracture well, graduating to her cane from a walker this week. No longer on pain medication.  Hypothyroidism (Goal: maintain stable tsh) -Controlled -Current treatment  Levothyroxine 50 mcg once daily Appropriate, Effective, Safe, Accessible -Medications previously tried: n/a  -Recommended to continue current medication Counseled on appropriate use  of levothyroxine - takes when waking up in the middle of night - has done this consistently, no fluctuations in TSH. Side effect review - no problems noted   Update 04/04/22 Same dose, TSH is WNL. No need for changes at this time. Continue to take as current.  Follow Up:  Patient agrees to Care Plan and Follow-up. Plan: HC 6 month gen. Pharmacist 8 month f/u telephone visit       Patient Goals/Self-Care Activities Patient will:  - take medications as prescribed  Medication Assistance: None required.  Patient affirms current coverage meets needs.  [id number] [place] [address] [address]Phone: [phone]: [phone]   Pt endorses 100% compliance    Future Appointments  Date Time Provider DSmackover 05/01/2022  1:30 PM Dohmeier, CAsencion Partridge MD [facility] None  06/11/2022 10:30 AM Dillingham, CLoel Lofty DO [facility] None    CBeverly Milch PharmD Clinical Pharmacist  [facility]([phone]

## 2022-03-28 DIAGNOSIS — M79644 Pain in right finger(s): Secondary | ICD-10-CM | POA: Diagnosis not present

## 2022-03-28 DIAGNOSIS — S62616A Displaced fracture of proximal phalanx of right little finger, initial encounter for closed fracture: Secondary | ICD-10-CM | POA: Diagnosis not present

## 2022-03-30 DIAGNOSIS — Z1231 Encounter for screening mammogram for malignant neoplasm of breast: Secondary | ICD-10-CM | POA: Diagnosis not present

## 2022-04-03 ENCOUNTER — Encounter: Payer: Self-pay | Admitting: Nurse Practitioner

## 2022-04-04 ENCOUNTER — Ambulatory Visit (INDEPENDENT_AMBULATORY_CARE_PROVIDER_SITE_OTHER): Payer: PPO | Admitting: Pharmacist

## 2022-04-04 DIAGNOSIS — E039 Hypothyroidism, unspecified: Secondary | ICD-10-CM

## 2022-04-04 DIAGNOSIS — M8580 Other specified disorders of bone density and structure, unspecified site: Secondary | ICD-10-CM

## 2022-04-04 NOTE — Patient Instructions (Addendum)
Visit Information   Goals Addressed             This Visit's Progress    Prevent Falls and Broken Bones-Osteopenia       Timeframe:  Long-Range Goal Priority:  High Start Date:    04/04/22                         Expected End Date:   10/04/22                    Follow Up Date 10/04/22/   - keep cell phone with me always - make an emergency alert plan in case I fall - pick up clutter from the floors - use a cane or walker    Why is this important?   When you fall, there are 3 things that control if a bone breaks or not.  These are the fall itself, how hard and the direction that you fall and how fragile your bones are.  Preventing falls is very important for you because of fragile bones.     Notes:        Patient Care Plan: ccm pharmacy care plan     Problem Identified: Insomnia hypothyroid GERD Osteopenia   Priority: High     Long-Range Goal: disease management   Start Date: 08/02/2021  Expected End Date: 08/02/2022  Recent Progress: On track  Priority: High  Note:    Pharmacist Clinical Goal(s):  Patient will contact provider office for questions/concerns as evidenced notation of same in electronic health record through collaboration with PharmD and provider.   Interventions: 1:1 collaboration with Midge Minium, MD regarding development and update of comprehensive plan of care as evidenced by provider attestation and co-signature Inter-disciplinary care team collaboration (see longitudinal plan of care) Comprehensive medication review performed; medication list updated in electronic medical record  Insomnia (Goal: optimize medication safety, ensure good sleep hygiene) -Controlled -Wakes up in middle of the night then goes back to sleep - has done this entire life. Feeling well rested most days, no interference with daily activities. -No daytime naps  -Current treatment: Quetiapine 75 mg once daily before bed Appropriate, Effective, Safe,  Accessible -Educated on Benefits of medication for symptom control Reviewed for side effects - no problems noted  -Recommended to continue current medication    Osteopenia (Goal ensure appropriate supplementation) -Controlled -Last DEXA Scan: 05/2021 - osteopenia. Low fall risk  -Very active. Does piliates, works out with trainer 2x/wk -Patient is not a candidate for pharmacologic treatment -Current treatment  Vitamin D3-Calcium supplement once daily Risedronate '150mg'$  once per month Appropriate, Effective, Safe, Accessible -Medications previously tried: n/a  -Recommend [phone] units of vitamin D daily. Recommend 1200 mg of calcium daily from dietary and supplemental sources. Recommend weight-bearing and muscle strengthening exercises for building and maintaining bone density. -Recommended to continue current medication  Update 04/04/22 Has taken ever since her fall. She reports compliance with this. Due for updated DEXA scan August 2024. Until then cautioned her to be careful/avoid falls. She is recovering from fracture well, graduating to her cane from a walker this week. No longer on pain medication.  Hypothyroidism (Goal: maintain stable tsh) -Controlled -Current treatment  Levothyroxine 50 mcg once daily Appropriate, Effective, Safe, Accessible -Medications previously tried: n/a  -Recommended to continue current medication Counseled on appropriate use of levothyroxine - takes when waking up in the middle of night - has done this consistently, no fluctuations in TSH.  Side effect review - no problems noted   Update 04/04/22 Same dose, TSH is WNL. No need for changes at this time. Continue to take as current.  Follow Up:  Patient agrees to Care Plan and Follow-up. Plan: HC 6 month gen. Pharmacist 8 month f/u telephone visit        The patient verbalized understanding of instructions, educational materials, and care plan provided today and DECLINED offer to receive copy  of patient instructions, educational materials, and care plan.  Telephone follow up appointment with pharmacy team member scheduled for: 49 months  Madox Corkins L Jaleen Grupp, [place], PharmD Clinical Pharmacist  [facility] [phone]

## 2022-04-06 DIAGNOSIS — S32591D Other specified fracture of right pubis, subsequent encounter for fracture with routine healing: Secondary | ICD-10-CM | POA: Diagnosis not present

## 2022-04-20 DIAGNOSIS — E039 Hypothyroidism, unspecified: Secondary | ICD-10-CM

## 2022-04-20 DIAGNOSIS — M8580 Other specified disorders of bone density and structure, unspecified site: Secondary | ICD-10-CM

## 2022-04-30 ENCOUNTER — Other Ambulatory Visit: Payer: Self-pay | Admitting: Adult Health

## 2022-05-01 ENCOUNTER — Encounter: Payer: Self-pay | Admitting: Neurology

## 2022-05-01 ENCOUNTER — Ambulatory Visit: Payer: PPO | Admitting: Neurology

## 2022-05-01 VITALS — BP 156/86 | HR 80 | Ht 63.0 in | Wt 118.0 lb

## 2022-05-01 DIAGNOSIS — F5104 Psychophysiologic insomnia: Secondary | ICD-10-CM | POA: Diagnosis not present

## 2022-05-01 MED ORDER — TIZANIDINE HCL 4 MG PO TABS: ORAL_TABLET | ORAL | 3 refills | Status: DC

## 2022-05-01 NOTE — Patient Instructions (Signed)
Insomnia Insomnia is a sleep disorder that makes it difficult to fall asleep or stay asleep. Insomnia can cause fatigue, low energy, difficulty concentrating, mood swings, and poor performance at work or school. There are three different ways to classify insomnia: Difficulty falling asleep. Difficulty staying asleep. Waking up too early in the morning. Any type of insomnia can be long-term (chronic) or short-term (acute). Both are common. Short-term insomnia usually lasts for 3 months or less. Chronic insomnia occurs at least three times a week for longer than 3 months. What are the causes? Insomnia may be caused by another condition, situation, or substance, such as: Having certain mental health conditions, such as anxiety and depression. Using caffeine, alcohol, tobacco, or drugs. Having gastrointestinal conditions, such as gastroesophageal reflux disease (GERD). Having certain medical conditions. These include: Asthma. Alzheimer's disease. Stroke. Chronic pain. An overactive thyroid gland (hyperthyroidism). Other sleep disorders, such as restless legs syndrome and sleep apnea. Menopause. Sometimes, the cause of insomnia may not be known. What increases the risk? Risk factors for insomnia include: Gender. Females are affected more often than males. Age. Insomnia is more common as people get older. Stress and certain medical and mental health conditions. Lack of exercise. Having an irregular work schedule. This may include working night shifts and traveling between different time zones. What are the signs or symptoms? If you have insomnia, the main symptom is having trouble falling asleep or having trouble staying asleep. This may lead to other symptoms, such as: Feeling tired or having low energy. Feeling nervous about going to sleep. Not feeling rested in the morning. Having trouble concentrating. Feeling irritable, anxious, or depressed. How is this diagnosed? This condition  may be diagnosed based on: Your symptoms and medical history. Your health care provider may ask about: Your sleep habits. Any medical conditions you have. Your mental health. A physical exam. How is this treated? Treatment for insomnia depends on the cause. Treatment may focus on treating an underlying condition that is causing the insomnia. Treatment may also include: Medicines to help you sleep. Counseling or therapy. Lifestyle adjustments to help you sleep better. Follow these instructions at home: Eating and drinking  Limit or avoid alcohol, caffeinated beverages, and products that contain nicotine and tobacco, especially close to bedtime. These can disrupt your sleep. Do not eat a large meal or eat spicy foods right before bedtime. This can lead to digestive discomfort that can make it hard for you to sleep. Sleep habits  Keep a sleep diary to help you and your health care provider figure out what could be causing your insomnia. Write down: When you sleep. When you wake up during the night. How well you sleep and how rested you feel the next day. Any side effects of medicines you are taking. What you eat and drink. Make your bedroom a dark, comfortable place where it is easy to fall asleep. Put up shades or blackout curtains to block light from outside. Use a white noise machine to block noise. Keep the temperature cool. Limit screen use before bedtime. This includes: Not watching TV. Not using your smartphone, tablet, or computer. Stick to a routine that includes going to bed and waking up at the same times every day and night. This can help you fall asleep faster. Consider making a quiet activity, such as reading, part of your nighttime routine. Try to avoid taking naps during the day so that you sleep better at night. Get out of bed if you are still awake after   15 minutes of trying to sleep. Keep the lights down, but try reading or doing a quiet activity. When you feel  sleepy, go back to bed. General instructions Take over-the-counter and prescription medicines only as told by your health care provider. Exercise regularly as told by your health care provider. However, avoid exercising in the hours right before bedtime. Use relaxation techniques to manage stress. Ask your health care provider to suggest some techniques that may work well for you. These may include: Breathing exercises. Routines to release muscle tension. Visualizing peaceful scenes. Make sure that you drive carefully. Do not drive if you feel very sleepy. Keep all follow-up visits. This is important. Contact a health care provider if: You are tired throughout the day. You have trouble in your daily routine due to sleepiness. You continue to have sleep problems, or your sleep problems get worse. Get help right away if: You have thoughts about hurting yourself or someone else. Get help right away if you feel like you may hurt yourself or others, or have thoughts about taking your own life. Go to your nearest emergency room or: Call 911. Call the National Suicide Prevention Lifeline at 1-800-273-8255 or 988. This is open 24 hours a day. Text the Crisis Text Line at 741741. Summary Insomnia is a sleep disorder that makes it difficult to fall asleep or stay asleep. Insomnia can be long-term (chronic) or short-term (acute). Treatment for insomnia depends on the cause. Treatment may focus on treating an underlying condition that is causing the insomnia. Keep a sleep diary to help you and your health care provider figure out what could be causing your insomnia. This information is not intended to replace advice given to you by your health care provider. Make sure you discuss any questions you have with your health care provider. Document Revised: 09/18/2021 Document Reviewed: 09/18/2021 Elsevier Patient Education  2023 [employer]  

## 2022-05-01 NOTE — Telephone Encounter (Signed)
Pt has an appt today with Dr Brett Fairy for 1 yr f/u. Wait to send refill until pt is seen.

## 2022-05-01 NOTE — Progress Notes (Signed)
PATIENT: [name] DOB: 13-May-1946  REASON FOR VISIT: follow up HISTORY FROM: patient Primary neurologist: [name]    HISTORY OF PRESENT ILLNESS: Today 05/01/22: Follow up. Overall stable.  There are some nights she may wake up and not be able to go back to sleep but not often.  I have the pleasure of meeting today again with [name] meanwhile 76 year old Caucasian female patient followed in this practice for chronic insomnia.  She has tried trazodone and none addictive medications in the past but is back on Seroquel and ties [name] which seems to control her insomnia the best. Never was a 'good sleeper' and he father was an alcoholic and she was hypervigilant at times,feeling responsible.   Her insomnia has been attributed to an anxiety disorder.  She had reported snoring but never had apnea to be considered a cause of her sleep disorder.  She also reports some cervicalgia.  On the normal neurologic basis the patient has a gait disorder, she had a fall and fractured her pelvis, she has been doing well walking with a cane or walker.  She graduated from a walker.  Her geriatric depression score was endorsed at 2.5 points out of 15 her Epworth sleepiness score is endorsed at 1 out of 24 and her fatigue severity scale was endorsed at 15 out of 63 possible points.    She feels that on medication she is neither depressed nor excessive fatigue. [place] and is not going out as much.  She is no longer riding since her second knee replacement in 2006. Shelly Jupiter, Sanders    MM, &-06-2021: [name] is a 75 year old female with a history of insomnia.  She returns today for follow-up.  She remains on Seroquel 75 mg at bedtime and tizanidine 4 mg at bedtime.  She reports that this continues to work well for her.  She denies any new issues.  She returns today for an evaluation.  05/02/20: [name] is a 76 year old female with a history of  insomnia.  She returns today for follow-up.  She tried trazodone but did not find it beneficial.  She is back on Seroquel and tizanidine.  She reports that the combination works well for her.  She denies any new issues.  She returns today for an evaluation.  HISTORY (Copied from [name]'s note) [name] is a 76 y.o. year old White or Caucasian female patient seen here on 10/26/2019.  I have the pleasure of seeing Shelly MINISH today, a right -handed White or Caucasian female with a possible sleep disorder.  She has a  has a past medical history of Arthritis, Cataract, Fracture (10/2016), History of hiatal hernia, Hypothyroidism, Insomnia due to anxiety and fear, and Snoring (09/29/2014).Marland Kitchen   HPI:  AMORY Sanders is a 76 y.o. female  Is seen here as a revisit  from [name] for chronic insomnia, treated with Seroquel .  Her last 4 appointments  were with [name], [name].  She reports cyclic insomnia, and related to stressful situation. Her father was an alcoholic and [name]. The patient moved a lot with her family and has had lifelong fear of going to sleep, related to her experiences with an intoxicated father coming home and being belligerent, but she was never physically abused. Marland Kitchen  She was born on [place], her brother in [place] and her sister in [place]. She never lived in a place  longer than 4 years. She lives in [place] since 1974.    Patient returns for followup after last visit [id number]- Cervical spine disease, controlled with regular exercise and massages. She has a history of chronic insomnia and is currently on Seroquel doing well on that medication she continues to exercise daily. She has no new neurologic complaints.    REVIEW OF SYSTEMS: Out of a complete 14 system review of symptoms, the patient complains only of the following symptoms, and all other reviewed systems are negative.  ALLERGIES: Allergies  Allergen Reactions   Codeine Nausea And Vomiting    Formaldehyde Other (See Comments)    Per allergy testing, tested allergic    HOME MEDICATIONS: Outpatient Medications Prior to Visit  Medication Sig Dispense Refill   ALEVE 220 MG tablet Take 220-440 mg by mouth 2 (two) times daily as needed (for pain).     Biotin 5 MG CAPS Take 5 mg by mouth daily.     Calcium Carb-Cholecalciferol (CALCIUM + D3 PO) Take 1 capsule by mouth 3 (three) times daily.     diclofenac Sodium (VOLTAREN) 1 % GEL Apply 2 g topically 4 (four) times daily as needed (for soreness or pain).     Evening Primrose Oil CAPS Take 2 capsules by mouth daily.     GLUCOSAMINE PO Take 3,000 mg by mouth daily.     HYDROcodone-acetaminophen (NORCO/VICODIN) 5-325 MG tablet Take 1-2 tablets by mouth every 4 (four) hours as needed for moderate pain. 14 tablet 0   levothyroxine (SYNTHROID) 50 MCG tablet Take 1 tablet (50 mcg total) by mouth daily before breakfast. 90 tablet 1   MILK THISTLE PO Take 1 tablet by mouth 2 (two) times daily.     Multiple Vitamin (MULTIVITAMIN) capsule Take 1 capsule by mouth daily.     Omega-3 Fatty Acids (OMEGA 3 PO) Take 3,600 mg by mouth daily.     oxyCODONE (OXY IR/ROXICODONE) 5 MG immediate release tablet      QUEtiapine (SEROQUEL) 25 MG tablet TAKE 3 TABLETS BY MOUTH AT BEDTIME (Patient taking differently: Take 75 mg by mouth at bedtime.) 270 tablet 3   risedronate (ACTONEL) 35 MG tablet Take 35 mg by mouth once a week.     senna-docusate (SENOKOT-S) 8.6-50 MG tablet Take 1 tablet by mouth at bedtime as needed for mild constipation. 15 tablet 0   tiZANidine (ZANAFLEX) 4 MG tablet TAKE 1 TABLET(4 MG) BY MOUTH AT BEDTIME 90 tablet 1   triamcinolone cream (KENALOG) 0.1 % Apply 1 application. topically 2 (two) times daily as needed (to eczema flares).     risedronate (ACTONEL) 150 MG tablet Take 150 mg by mouth every 30 (thirty) days. with water on empty stomach, nothing by mouth or lie down for next 30 minutes.     No facility-administered medications  prior to visit.    PAST MEDICAL HISTORY: Past Medical History:  Diagnosis Date   Arthritis    Dr Estanislado Pandy   Cataract    Fracture 10/2016   left ankel   History of hiatal hernia    found on endoscopy   Hypothyroidism    Insomnia due to anxiety and fear    Snoring 09/29/2014    PAST SURGICAL HISTORY: Past Surgical History:  Procedure Laterality Date   ABDOMINAL HYSTERECTOMY  1987   TAH,BSO, APPENDECTOMY   APPENDECTOMY  1987   APPENDECTOMY AT [facility],BSO   CATARACT EXTRACTION, BILATERAL Bilateral    20 plus years ago   COLONOSCOPY  X 3; [facility]. All negative   KNEE ARTHROSCOPY Bilateral    METATARSAL OSTEOTOMY WITH BUNIONECTOMY Left 10/31/2017   Procedure: Left First Metatarsal Scarf, Modified McBride and Akin Osteotomies;  Surgeon: Wylene Simmer, Sanders;  Location: [facility];  Service: Orthopedics;  Laterality: Left;   REPLACEMENT TOTAL KNEE Bilateral    X 1 each; Dr Morene Rankins CUFF REPAIR Right 2010    FAMILY HISTORY: Family History  Problem Relation Age of Onset   Ovarian cancer Mother 32   Alcohol abuse Father    Cirrhosis Father    Stroke Sister 65   Diabetes Paternal Grandmother    Heart disease Neg Hx    Hypertension Neg Hx    Hyperlipidemia Neg Hx    Colon cancer Neg Hx     SOCIAL HISTORY: Social History   Socioeconomic History   Marital status: Married    Spouse name: Not on file   Number of children: 0   Years of education: Not on file   Highest education level: Not on file  Occupational History   Occupation: retired    [employer] Sanders: NOT EMPLOYED    Comment: Education officer, museum   Tobacco Use   Smoking status: Never   Smokeless tobacco: Never  Vaping Use   Vaping Use: Never used  Substance and Sexual Activity   Alcohol use: Yes    Alcohol/week: 14.0 standard drinks of alcohol    Types: 14 Standard drinks or equivalent per week    Comment: 1 drink nightly   Drug use: No   Sexual activity: Not Currently    Birth  control/protection: Surgical  Other Topics Concern   Not on file  Social History Narrative   No children    2 dogs    3 horses    Husband patient of [name]    Social Determinants of Health   Financial Resource Strain: Low Risk  (06/19/2021)   Overall Financial Resource Strain (CARDIA)    Difficulty of Paying Living Expenses: Not hard at all  Food Insecurity: No Food Insecurity (06/19/2021)   Hunger Vital Sign    Worried About Running Out of Food in the Last Year: Never true    New Deal in the Last Year: Never true  Transportation Needs: No Transportation Needs (06/19/2021)   PRAPARE - Hydrologist (Medical): No    Lack of Transportation (Non-Medical): No  Physical Activity: Sufficiently Active (06/19/2021)   Exercise Vital Sign    Days of Exercise per Week: 5 days    Minutes of Exercise per Session: 60 min  Stress: No Stress Concern Present (06/19/2021)   Sanders    Feeling of Stress : Not at all  Social Connections: Moderately Integrated (06/19/2021)   Social Connection and Isolation Panel [NHANES]    Frequency of Communication with Friends and Family: More than three times a week    Frequency of Social Gatherings with Friends and Family: More than three times a week    Attends Religious Services: Never    Marine scientist or Organizations: Yes    Attends Music therapist: More than 4 times per year    Marital Status: Married  Human resources officer Violence: Not At Risk (06/19/2021)   Humiliation, Afraid, Rape, and Kick questionnaire    Fear of Current or Ex-Partner: No    Emotionally Abused: No    Physically Abused: No  Sexually Abused: No      PHYSICAL EXAM  Vitals:   05/01/22 1336  BP: (!) 156/86  Pulse: 80  Weight: 118 lb (53.5 kg)  Height: '5\' 3"'$  (1.6 m)   Body mass index is 20.9 kg/m. Neck size ; 14"  Mallampati 2.   Generalized: Well  groomed, normal developed, in no acute distress   Neurological examination  Mentation: Alert oriented to time, place, history taking. Follows all commands speech and language fluent Cranial nerve : Pupils were equal in shape and size- round reactive to light. Extraocular movements were full, visual field were full on confrontational test. .Head turning and shoulder shrug  were normal and symmetric. Motor: full  strength of all 4 extremities, symmetric motor tone is noted throughout. No cogwheel rigidity.    DIAGNOSTIC DATA (LABS, IMAGING, TESTING) - I reviewed patient records, labs, notes, testing and imaging myself where available.  Lab Results  Component Value Date   WBC 6.4 01/24/2022   HGB 12.5 01/24/2022   HCT 36.7 01/24/2022   MCV 97.0 01/24/2022   PLT 394.0 01/24/2022      Component Value Date/Time   NA 137 01/24/2022 1444   NA 139 09/23/2013 1458   K 4.2 01/24/2022 1444   CL 100 01/24/2022 1444   CO2 27 01/24/2022 1444   GLUCOSE 93 01/24/2022 1444   BUN 16 01/24/2022 1444   BUN 25 09/23/2013 1458   CREATININE 0.66 01/24/2022 1444   CALCIUM 10.0 01/24/2022 1444   PROT 5.6 (L) 01/14/2022 0325   PROT 7.3 09/23/2013 1458   ALBUMIN 3.3 (L) 01/14/2022 0325   ALBUMIN 4.7 09/23/2013 1458   AST 12 (L) 01/14/2022 0325   ALT 21 01/14/2022 0325   ALKPHOS 31 (L) 01/14/2022 0325   BILITOT 0.7 01/14/2022 0325   GFRNONAA >60 01/14/2022 0325   GFRAA 88 09/23/2013 1458   Lab Results  Component Value Date   CHOL 238 (H) 12/01/2020   HDL 107.60 12/01/2020   LDLCALC 112 (H) 12/01/2020   TRIG 92.0 12/01/2020   CHOLHDL 2 12/01/2020    Lab Results  Component Value Date   TSH 4.02 12/01/2020      ASSESSMENT AND PLAN 76 y.o. 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 here with:  Insomnia, chronic - controlled on medication. She is aware of the causes, had been years in therapy- insomnia remained.    Continue Seroquel 75 mg at bedtime Continue tizanidine 4 mg at bedtime Advised if symptoms worsen or she develops new symptoms she should let us know Follow-up in 1 year or sooner if needed   [name]  05/01/2022, 2:02 PM [facility] [address], [place], [place] [address] [phone]

## 2022-05-14 ENCOUNTER — Other Ambulatory Visit: Payer: Self-pay

## 2022-05-14 MED ORDER — LEVOTHYROXINE SODIUM 50 MCG PO TABS: 50.0000 ug | ORAL_TABLET | Freq: Every day | ORAL | 1 refills | Status: DC

## 2022-05-18 DIAGNOSIS — S329XXA Fracture of unspecified parts of lumbosacral spine and pelvis, initial encounter for closed fracture: Secondary | ICD-10-CM | POA: Diagnosis not present

## 2022-05-30 ENCOUNTER — Ambulatory Visit (INDEPENDENT_AMBULATORY_CARE_PROVIDER_SITE_OTHER): Payer: PPO | Admitting: Internal Medicine

## 2022-05-30 ENCOUNTER — Encounter: Payer: Self-pay | Admitting: Internal Medicine

## 2022-05-30 VITALS — BP 130/84 | HR 81 | Temp 98.5°F | Resp 14 | Ht 63.0 in

## 2022-05-30 DIAGNOSIS — L089 Local infection of the skin and subcutaneous tissue, unspecified: Secondary | ICD-10-CM

## 2022-05-30 DIAGNOSIS — T148XXA Other injury of unspecified body region, initial encounter: Secondary | ICD-10-CM | POA: Diagnosis not present

## 2022-05-30 HISTORY — DX: Local infection of the skin and subcutaneous tissue, unspecified: L08.9

## 2022-05-30 MED ORDER — DOXYCYCLINE HYCLATE 100 MG PO TABS: 100.0000 mg | ORAL_TABLET | Freq: Two times a day (BID) | ORAL | 0 refills | Status: AC

## 2022-05-30 NOTE — Assessment & Plan Note (Signed)
Sent abx Cleaned, dressed, put antibiotic ointment and steristrip, then taped on gauze to protect.

## 2022-05-30 NOTE — Patient Instructions (Signed)
Call if any problems with the doxycycline Try to keep the dressing dry- but ok to reapply the antibiotic ointment.

## 2022-05-30 NOTE — Progress Notes (Signed)
   Acute Office Visit  Subjective:     Patient ID: [name], female    DOB: December 29, 1945, 76 y.o.   MRN: [id number]  Chief Complaint  Patient presents with  . Trauma    Pt has a cut on right index finger that is stiff, red and swollen. Only painful when touched. Cut herself on Saturday.     Trauma  Patient is in today for cut left index dorsal 1st phalanx.  The cut was 4 d ago, broken dish/glass that was clean- swollen and painful over the knuckle down. She denies any loss of feeling or function or color change of the index finger.  Was seen by someone else who was concerned and advised her to be seen today.     Objective:    BP 130/84 (BP Location: Left Arm)   Pulse 81   Temp 98.5 F (36.9 C) (Oral)   Resp 14   Ht '5\' 3"'$  (1.6 m)   SpO2 94%   BMI 20.90 kg/m    Physical Exam  There is a laceration over the entire dorsal section of the right index finger that is healing by secondary intention with granulation tissue.  There is extensive redness extending from the margins of the wound down over the index finger mcp joint/knuckle.  No felon or fluctuance could be palpated in the affected region.      Assessment & Plan:   Problem List Items Addressed This Visit       Active Problems   Infected laceration - Primary    Sent abx Cleaned, dressed, put antibiotic ointment and steristrip, then taped on gauze to protect.       Relevant Medications   Zoster Vaccine Adjuvanted [facility]) injection   doxycycline (VIBRA-TABS) 100 MG tablet    Meds ordered this encounter  Medications  . doxycycline (VIBRA-TABS) 100 MG tablet    Sig: Take 1 tablet (100 mg total) by mouth 2 (two) times daily for 10 days.    Dispense:  20 tablet    Refill:  0    No follow-ups on file.  Loralee Pacas, MD

## 2022-06-11 ENCOUNTER — Encounter: Payer: PPO | Admitting: Plastic Surgery

## 2022-06-13 ENCOUNTER — Ambulatory Visit (INDEPENDENT_AMBULATORY_CARE_PROVIDER_SITE_OTHER): Payer: PPO | Admitting: Internal Medicine

## 2022-06-13 ENCOUNTER — Encounter: Payer: Self-pay | Admitting: Internal Medicine

## 2022-06-13 VITALS — BP 138/78 | HR 81 | Temp 98.3°F | Resp 14 | Ht 63.0 in | Wt 119.4 lb

## 2022-06-13 DIAGNOSIS — L089 Local infection of the skin and subcutaneous tissue, unspecified: Secondary | ICD-10-CM | POA: Diagnosis not present

## 2022-06-13 DIAGNOSIS — T148XXD Other injury of unspecified body region, subsequent encounter: Secondary | ICD-10-CM

## 2022-06-13 DIAGNOSIS — Z23 Encounter for immunization: Secondary | ICD-10-CM

## 2022-06-13 MED ORDER — DOXYCYCLINE HYCLATE 100 MG PO TABS: 100.0000 mg | ORAL_TABLET | Freq: Two times a day (BID) | ORAL | 0 refills | Status: DC

## 2022-06-13 NOTE — Progress Notes (Signed)
   [name] is a 76 y.o. female who presents today for an office visit.  Assessment/Plan:  Overview:   [name]      returns with improvmenet but not resolution of her infected, lacerated mcp joint. No abscess can be felt.  She still cant move fully but declines hand surgeon for now. Also suspect rheumatoid but she declines referral for that.  [name] was seen today for follow-up.  Wound infection -     Doxycycline Hyclate; Take 1 tablet (100 mg total) by mouth 2 (two) times daily.  Dispense: 20 tablet; Refill: 0 -     Tdap vaccine greater than or equal to 7yo IM   No follow-ups on file.     Subjective:  HPI:  The red swollen index joint is still sensitive to touch, but no induration It feels tight but not sure if its still infected She doesn't feel any pus collection, although its swollen.        Objective:  Physical Exam: BP 138/78 (BP Location: Left Arm)   Pulse 81   Temp 98.3 F (36.8 C) (Temporal)   Resp 14   Ht '5\' 3"'$  (1.6 m)   Wt 119 lb 6.4 oz (54.2 kg)   SpO2 96%   BMI 21.15 kg/m    Gen: No acute distress, resting comfortably Psych: Normal affect and thought content  Problem specific physical exam findings:  Inflamed left index mcp looks better but still red. Carefully palpated but no felon/abscess/fluctuance detectible.      Loralee Pacas, MD 06/13/2022 2:35 PM

## 2022-06-14 ENCOUNTER — Telehealth: Payer: Self-pay | Admitting: Family Medicine

## 2022-06-14 NOTE — Telephone Encounter (Signed)
Left message for patient to call back and schedule Medicare Annual Wellness Visit (AWV).   Please offer to do virtually or by telephone.  Left office number and my jabber [phone].  Last AWV:06/19/2021  Please schedule at anytime with Nurse Health Advisor.

## 2022-06-19 ENCOUNTER — Encounter: Payer: Self-pay | Admitting: Plastic Surgery

## 2022-06-19 ENCOUNTER — Ambulatory Visit (INDEPENDENT_AMBULATORY_CARE_PROVIDER_SITE_OTHER): Payer: Self-pay | Admitting: Plastic Surgery

## 2022-06-19 DIAGNOSIS — Z719 Counseling, unspecified: Secondary | ICD-10-CM

## 2022-06-19 NOTE — Progress Notes (Signed)

## 2022-06-20 DIAGNOSIS — M5451 Vertebrogenic low back pain: Secondary | ICD-10-CM | POA: Diagnosis not present

## 2022-06-28 DIAGNOSIS — M545 Low back pain, unspecified: Secondary | ICD-10-CM | POA: Diagnosis not present

## 2022-07-05 DIAGNOSIS — M545 Low back pain, unspecified: Secondary | ICD-10-CM | POA: Diagnosis not present

## 2022-07-06 DIAGNOSIS — S32591D Other specified fracture of right pubis, subsequent encounter for fracture with routine healing: Secondary | ICD-10-CM | POA: Diagnosis not present

## 2022-07-06 DIAGNOSIS — S329XXA Fracture of unspecified parts of lumbosacral spine and pelvis, initial encounter for closed fracture: Secondary | ICD-10-CM | POA: Diagnosis not present

## 2022-07-11 DIAGNOSIS — M545 Low back pain, unspecified: Secondary | ICD-10-CM | POA: Diagnosis not present

## 2022-07-18 DIAGNOSIS — M545 Low back pain, unspecified: Secondary | ICD-10-CM | POA: Diagnosis not present

## 2022-07-19 DIAGNOSIS — M545 Low back pain, unspecified: Secondary | ICD-10-CM | POA: Diagnosis not present

## 2022-07-26 DIAGNOSIS — M545 Low back pain, unspecified: Secondary | ICD-10-CM | POA: Diagnosis not present

## 2022-08-01 DIAGNOSIS — M545 Low back pain, unspecified: Secondary | ICD-10-CM | POA: Diagnosis not present

## 2022-08-02 ENCOUNTER — Ambulatory Visit (INDEPENDENT_AMBULATORY_CARE_PROVIDER_SITE_OTHER): Payer: PPO

## 2022-08-02 VITALS — Ht 64.0 in | Wt 116.0 lb

## 2022-08-02 DIAGNOSIS — Z0000 Encounter for general adult medical examination without abnormal findings: Secondary | ICD-10-CM

## 2022-08-02 DIAGNOSIS — M545 Low back pain, unspecified: Secondary | ICD-10-CM | POA: Diagnosis not present

## 2022-08-02 NOTE — Patient Instructions (Signed)
[name] , Thank you for taking time to come for your Medicare Wellness Visit. I appreciate your ongoing commitment to your health goals. Please review the following plan we discussed and let me know if I can assist you in the future.   These are the goals we discussed:  Goals      Patient Stated     Continue current lifestyle habits     PharmD Care Plan     CARE PLAN ENTRY  Current Barriers:  Chronic Disease Management support, education, and care coordination needs related to Hypothyroidism, Osteopopenia, and insomnia   Insomnia Pharmacist Clinical Goal(s) Over the next 180 days, patient will work with PharmD and providers to minimize symptoms of insomnia Current regimen:  Quetiapine 75 mg every night at bedtime Interventions: Continue current management Patient self care activities - Over the next 180 days, patient will: Continue current management Osteopenia Pharmacist Clinical Goal(s) Over the next 180 days, patient will work with PharmD and providers to maintain bone health Current regimen:  Calcium Carbonate-Vitamin D (CALCIUM + D PO) once daily  Interventions: Continue current management Patient self care activities - Over the next 180 days, patient will: Continue current management  Hypothyroidism Pharmacist Clinical Goal(s) Over the next 180 days, patient will work with PharmD and providers to maintain goal TSH levels Current regimen:  Levothyroxine 50 mcg daily  Interventions: Continue current management Patient self care activities - Over the next 180 days, patient will: Continue current management  Medication management Pharmacist Clinical Goal(s): Over the next 180 days, patient will work with PharmD and providers to maintain optimal medication adherence Current pharmacy: [facility]  Interventions Comprehensive medication review performed. Continue current medication management strategy Patient self care activities - Over the next 180 days, patient  will: Focus on medication adherence - Continue current management Take medications as prescribed Report any questions or concerns to PharmD and/or provider(s)  Initial goal documentation.     Prevent Falls and Broken Bones-Osteopenia     Timeframe:  Long-Range Goal Priority:  High Start Date:    04/04/22                         Expected End Date:   10/04/22                    Follow Up Date 10/04/22/   - keep cell phone with me always - make an emergency alert plan in case I fall - pick up clutter from the floors - use a cane or walker    Why is this important?   When you fall, there are 3 things that control if a bone breaks or not.  These are the fall itself, how hard and the direction that you fall and how fragile your bones are.  Preventing falls is very important for you because of fragile bones.     Notes:      Weight (lb) < 125 lb (56.7 kg)     Lose weight by staying active.         This is a list of the screening recommended for you and due dates:  Health Maintenance  Topic Date Due   Flu Shot  05/22/2022   COVID-19 Vaccine (6 - Pfizer series) 10/21/2022*   Tetanus Vaccine  06/13/2032   Pneumonia Vaccine  Completed   DEXA scan (bone density measurement)  Completed   Hepatitis C Screening: USPSTF Recommendation to screen - Ages 3-79 yo.  Completed   Zoster (Shingles) Vaccine  Completed   HPV Vaccine  Aged Out   Colon Cancer Screening  Discontinued  *Topic was postponed. The date shown is not the original due date.    Advanced directives: Please bring a copy of your health care power of attorney and living will to the office to be added to your chart at your convenience.   Conditions/risks identified: Aim for 30 minutes of exercise or brisk walking, 6-8 glasses of water, and 5 servings of fruits and vegetables each day.   Next appointment: Follow up in one year for your annual wellness visit    Preventive Care 65 Years and Older, Female Preventive care  refers to lifestyle choices and visits with your health care provider that can promote health and wellness. What does preventive care include? A yearly physical exam. This is also called an annual well check. Dental exams once or twice a year. Routine eye exams. Ask your health care provider how often you should have your eyes checked. Personal lifestyle choices, including: Daily care of your teeth and gums. Regular physical activity. Eating a healthy diet. Avoiding tobacco and drug use. Limiting alcohol use. Practicing safe sex. Taking low-dose aspirin every day. Taking vitamin and mineral supplements as recommended by your health care provider. What happens during an annual well check? The services and screenings done by your health care provider during your annual well check will depend on your age, overall health, lifestyle risk factors, and family history of disease. Counseling  Your health care provider may ask you questions about your: Alcohol use. Tobacco use. Drug use. Emotional well-being. Home and relationship well-being. Sexual activity. Eating habits. History of falls. Memory and ability to understand (cognition). Work and work Statistician. Reproductive health. Screening  You may have the following tests or measurements: Height, weight, and BMI. Blood pressure. Lipid and cholesterol levels. These may be checked every 5 years, or more frequently if you are over 74 years old. Skin check. Lung cancer screening. You may have this screening every year starting at age 9 if you have a 30-pack-year history of smoking and currently smoke or have quit within the past 15 years. Fecal occult blood test (FOBT) of the stool. You may have this test every year starting at age 17. Flexible sigmoidoscopy or colonoscopy. You may have a sigmoidoscopy every 5 years or a colonoscopy every 10 years starting at age 61. Hepatitis C blood test. Hepatitis B blood test. Sexually transmitted  disease (STD) testing. Diabetes screening. This is done by checking your blood sugar (glucose) after you have not eaten for a while (fasting). You may have this done every 1-3 years. Bone density scan. This is done to screen for osteoporosis. You may have this done starting at age 82. Mammogram. This may be done every 1-2 years. Talk to your health care provider about how often you should have regular mammograms. Talk with your health care provider about your test results, treatment options, and if necessary, the need for more tests. Vaccines  Your health care provider may recommend certain vaccines, such as: Influenza vaccine. This is recommended every year. Tetanus, diphtheria, and acellular pertussis (Tdap, Td) vaccine. You may need a Td booster every 10 years. Zoster vaccine. You may need this after age 44. Pneumococcal 13-valent conjugate (PCV13) vaccine. One dose is recommended after age 60. Pneumococcal polysaccharide (PPSV23) vaccine. One dose is recommended after age 62. Talk to your health care provider about which screenings and vaccines you need and  how often you need them. This information is not intended to replace advice given to you by your health care provider. Make sure you discuss any questions you have with your health care provider. Document Released: 11/04/2015 Document Revised: 06/27/2016 Document Reviewed: 08/09/2015 Elsevier Interactive Patient Education  2017 [employer] Prevention in the Home Falls can cause injuries. They can happen to people of all ages. There are many things you can do to make your home safe and to help prevent falls. What can I do on the outside of my home? Regularly fix the edges of walkways and driveways and fix any cracks. Remove anything that might make you trip as you walk through a door, such as a raised step or threshold. Trim any bushes or trees on the path to your home. Use bright outdoor lighting. Clear any walking paths of  anything that might make someone trip, such as rocks or tools. Regularly check to see if handrails are loose or broken. Make sure that both sides of any steps have handrails. Any raised decks and porches should have guardrails on the edges. Have any leaves, snow, or ice cleared regularly. Use sand or salt on walking paths during winter. Clean up any spills in your garage right away. This includes oil or grease spills. What can I do in the bathroom? Use night lights. Install grab bars by the toilet and in the tub and shower. Do not use towel bars as grab bars. Use non-skid mats or decals in the tub or shower. If you need to sit down in the shower, use a plastic, non-slip stool. Keep the floor dry. Clean up any water that spills on the floor as soon as it happens. Remove soap buildup in the tub or shower regularly. Attach bath mats securely with double-sided non-slip rug tape. Do not have throw rugs and other things on the floor that can make you trip. What can I do in the bedroom? Use night lights. Make sure that you have a light by your bed that is easy to reach. Do not use any sheets or blankets that are too big for your bed. They should not hang down onto the floor. Have a firm chair that has side arms. You can use this for support while you get dressed. Do not have throw rugs and other things on the floor that can make you trip. What can I do in the kitchen? Clean up any spills right away. Avoid walking on wet floors. Keep items that you use a lot in easy-to-reach places. If you need to reach something above you, use a strong step stool that has a grab bar. Keep electrical cords out of the way. Do not use floor polish or wax that makes floors slippery. If you must use wax, use non-skid floor wax. Do not have throw rugs and other things on the floor that can make you trip. What can I do with my stairs? Do not leave any items on the stairs. Make sure that there are handrails on both  sides of the stairs and use them. Fix handrails that are broken or loose. Make sure that handrails are as long as the stairways. Check any carpeting to make sure that it is firmly attached to the stairs. Fix any carpet that is loose or worn. Avoid having throw rugs at the top or bottom of the stairs. If you do have throw rugs, attach them to the floor with carpet tape. Make sure that you have  a light switch at the top of the stairs and the bottom of the stairs. If you do not have them, ask someone to add them for you. What else can I do to help prevent falls? Wear shoes that: Do not have high heels. Have rubber bottoms. Are comfortable and fit you well. Are closed at the toe. Do not wear sandals. If you use a stepladder: Make sure that it is fully opened. Do not climb a closed stepladder. Make sure that both sides of the stepladder are locked into place. Ask someone to hold it for you, if possible. Clearly mark and make sure that you can see: Any grab bars or handrails. First and last steps. Where the edge of each step is. Use tools that help you move around (mobility aids) if they are needed. These include: Canes. Walkers. Scooters. Crutches. Turn on the lights when you go into a dark area. Replace any light bulbs as soon as they burn out. Set up your furniture so you have a clear path. Avoid moving your furniture around. If any of your floors are uneven, fix them. If there are any pets around you, be aware of where they are. Review your medicines with your doctor. Some medicines can make you feel dizzy. This can increase your chance of falling. Ask your doctor what other things that you can do to help prevent falls. This information is not intended to replace advice given to you by your health care provider. Make sure you discuss any questions you have with your health care provider. Document Released: 08/04/2009 Document Revised: 03/15/2016 Document Reviewed: 11/12/2014 Elsevier  Interactive Patient Education  2017 [employer].

## 2022-08-02 NOTE — Progress Notes (Signed)
Subjective:   [name] is a 76 y.o. female who presents for Medicare Annual (Subsequent) preventive examination.   Virtual Visit via Telephone Note  I connected with  [name] on 08/02/22 at  2:30 PM EDT by telephone and verified that I am speaking with the correct person using two identifiers.  Location: Patient: Home  Provider: [place]  Persons participating in the virtual visit: patient/Nurse Health Advisor   I discussed the limitations, risks, security and privacy concerns of performing an evaluation and management service by telephone and the availability of in person appointments. The patient expressed understanding and agreed to proceed.  Interactive audio and video telecommunications were attempted between this nurse and patient, however failed, due to patient having technical difficulties OR patient did not have access to video capability.  We continued and completed visit with audio only.  Some vital signs may be absent or patient reported.   Daphane Shepherd, LPN  Review of Systems     Cardiac Risk Factors include: advanced age (>14mn, >>4women)     Objective:    Today's Vitals   08/02/22 1436  Weight: 116 lb (52.6 kg)  Height: '5\' 4"'$  (1.626 m)   Body mass index is 19.91 kg/m.     08/02/2022    2:42 PM 01/13/2022    4:15 PM 01/13/2022    8:31 AM 10/14/2019   12:49 PM 10/31/2017   10:02 AM 07/17/2017    3:16 PM 09/29/2015    2:33 PM  Advanced Directives  Does Patient Have a Medical Advance Directive? Yes Yes No Yes Yes Yes Yes  Type of AParamed[place]of [place] will Out of facility DNR (pink MOST or yellow form);Living will;Healthcare Power of Attorney  Living will;Healthcare Power of [place]Living will [place]Living will [place]Living will  Does patient want to make changes to medical advance directive?  No - Patient declined  No - Patient  declined     Copy of [place] Chart? No - copy requested No - copy requested  No - copy requested Yes No - copy requested No - copy requested    Current Medications (verified) Outpatient Encounter Medications as of 08/02/2022  Medication Sig   ALEVE 220 MG tablet Take 220-440 mg by mouth 2 (two) times daily as needed (for pain).   Calcium Carb-Cholecalciferol (CALCIUM + D3 PO) Take 1 capsule by mouth 3 (three) times daily.   desonide (DESOWEN) 0.05 % ointment    diclofenac Sodium (VOLTAREN) 1 % GEL Apply 2 g topically 4 (four) times daily as needed (for soreness or pain).   ergocalciferol (VITAMIN D2) 1.25 MG (50000 UT) capsule Take 1 capsule by mouth daily.   Evening Primrose Oil CAPS Take 2 capsules by mouth daily.   GLUCOSAMINE PO Take 3,000 mg by mouth daily.   levothyroxine (SYNTHROID) 50 MCG tablet Take 1 tablet (50 mcg total) by mouth daily before breakfast.   MILK THISTLE PO Take 1 tablet by mouth 2 (two) times daily.   Multiple Vitamin (MULTIVITAMIN) capsule Take 1 capsule by mouth daily.   Omega-3 Fatty Acids (OMEGA 3 PO) Take 3,600 mg by mouth daily.   QUEtiapine (SEROQUEL) 25 MG tablet TAKE 3 TABLETS BY MOUTH AT BEDTIME   risedronate (ACTONEL) 35 MG tablet Take 35 mg by mouth once a week.   tiZANidine (ZANAFLEX) 4 MG tablet TAKE 1 TABLET(4 MG) BY MOUTH AT BEDTIME  triamcinolone cream (KENALOG) 0.1 % Apply 1 application. topically 2 (two) times daily as needed (to eczema flares).   diazepam (VALIUM) 5 MG tablet TAKE 1 TABLET BY MOUTH 1 HOUR BEFORE PROCEDURE AS DIRECTED. MAY REPEAT AFTER 45 MINUTES IF NOT EFFECTIVE. NO DRIVING FOR 4 HOURS AFTER (Patient not taking: Reported on 08/02/2022)   doxycycline (VIBRA-TABS) 100 MG tablet Take 1 tablet (100 mg total) by mouth 2 (two) times daily. (Patient not taking: Reported on 08/02/2022)   Zoster Vaccine Adjuvanted [facility]) injection    No facility-administered encounter medications on file as of 08/02/2022.     Allergies (verified) Codeine and Formaldehyde   History: Past Medical History:  Diagnosis Date   Arthritis    Dr Estanislado Pandy   Cataract    Fracture 10/2016   left ankel   History of hiatal hernia    found on endoscopy   Hypothyroidism    Infected laceration 05/30/2022   Insomnia due to anxiety and fear    Snoring 09/29/2014   Past Surgical History:  Procedure Laterality Date   ABDOMINAL HYSTERECTOMY  1987   TAH,BSO, APPENDECTOMY   APPENDECTOMY  1987   APPENDECTOMY AT TAH,BSO   CATARACT EXTRACTION, BILATERAL Bilateral    20 plus years ago   COLONOSCOPY      X 3; [facility]. All negative   KNEE ARTHROSCOPY Bilateral    METATARSAL OSTEOTOMY WITH BUNIONECTOMY Left 10/31/2017   Procedure: Left First Metatarsal Scarf, Modified McBride and Akin Osteotomies;  Surgeon: Wylene Simmer, MD;  Location: [place];  Service: Orthopedics;  Laterality: Left;   REPLACEMENT TOTAL KNEE Bilateral    X 1 each; Dr Morene Rankins CUFF REPAIR Right 2010   Family History  Problem Relation Age of Onset   Ovarian cancer Mother 35   Alcohol abuse Father    Cirrhosis Father    Stroke Sister 52   Diabetes Paternal Grandmother    Heart disease Neg Hx    Hypertension Neg Hx    Hyperlipidemia Neg Hx    Colon cancer Neg Hx    Social History   Socioeconomic History   Marital status: Married    Spouse name: Not on file   Number of children: 0   Years of education: Not on file   Highest education level: Not on file  Occupational History   Occupation: retired    Fish farm manager: NOT EMPLOYED    Comment: Education officer, museum   Tobacco Use   Smoking status: Never   Smokeless tobacco: Never  Vaping Use   Vaping Use: Never used  Substance and Sexual Activity   Alcohol use: Yes    Alcohol/week: 14.0 standard drinks of alcohol    Types: 14 Standard drinks or equivalent per week    Comment: 1 drink nightly   Drug use: No   Sexual activity: Not Currently    Birth control/protection:  Surgical  Other Topics Concern   Not on file  Social History Narrative   No children    2 dogs    3 horses    Husband patient of Dr. Birdie Riddle    Social Determinants of Health   Financial Resource Strain: Low Risk  (08/02/2022)   Overall Financial Resource Strain (CARDIA)    Difficulty of Paying Living Expenses: Not hard at all  Food Insecurity: No Food Insecurity (08/02/2022)   Hunger Vital Sign    Worried About Running Out of Food in the Last Year: Never true    Ran Out of Food  in the Last Year: Never true  Transportation Needs: No Transportation Needs (08/02/2022)   PRAPARE - Hydrologist (Medical): No    Lack of Transportation (Non-Medical): No  Physical Activity: Sufficiently Active (08/02/2022)   Exercise Vital Sign    Days of Exercise per Week: 5 days    Minutes of Exercise per Session: 60 min  Stress: No Stress Concern Present (08/02/2022)   [place]    Feeling of Stress : Not at all  Social Connections: Moderately Integrated (08/02/2022)   Social Connection and Isolation Panel [NHANES]    Frequency of Communication with Friends and Family: More than three times a week    Frequency of Social Gatherings with Friends and Family: More than three times a week    Attends Religious Services: More than 4 times per year    Active Member of [employer] or Organizations: No    Attends Music therapist: Never    Marital Status: Married    Tobacco Counseling Counseling given: Not Answered   Clinical Intake:  Pre-visit preparation completed: Yes  Pain : No/denies pain     Nutritional Risks: None Diabetes: No  How often do you need to have someone help you when you read instructions, pamphlets, or other written materials from your doctor or pharmacy?: 1 - Never  Diabetic?no   Interpreter Needed?: No  Information entered by :: Jadene Pierini, LPN   Activities of  Daily Living    08/02/2022    2:42 PM 01/13/2022    4:15 PM  In your present state of health, do you have any difficulty performing the following activities:  Hearing? 0 0  Vision? 0 0  Difficulty concentrating or making decisions? 0 0  Walking or climbing stairs? 0 1  Dressing or bathing? 0 0  Doing errands, shopping? 0 0  Preparing Food and eating ? N   Using the Toilet? N   In the past six months, have you accidently leaked urine? N   Do you have problems with loss of bowel control? N   Managing your Medications? N   Managing your Finances? N   Housekeeping or managing your Housekeeping? N     Patient Care Team: Midge Minium, MD as PCP - General (Family Medicine) Huel Cote, NP (Inactive) as Nurse Practitioner (Obstetrics and Gynecology) Dennie Bible, NP as Nurse Practitioner (Family Medicine) Dohmeier, Asencion Partridge, MD as Consulting Physician (Neurology) Gale Journey, Gwen Her, MD as Referring Physician (Endocrinology) Kelton Pillar, MD as Referring Physician (Orthopedic Surgery) Harriett Sine, MD as Consulting Physician (Dermatology) [place], Heather, [place] as Consulting Physician (Optometry) Linton Rump, PT as Physical Therapist (Physical Therapy) Edythe Clarity, [facility] (Pharmacist)  Indicate any recent Medical Services you may have received from other than [facility] providers in the past year (date may be approximate).     Assessment:   This is a routine wellness examination for [employer].  Hearing/Vision screen Vision Screening - Comments:: Annual eye exams   Dietary issues and exercise activities discussed: Current Exercise Habits: Home exercise routine, Type of exercise: strength training/weights;walking, Time (Minutes): 60, Frequency (Times/Week): 5, Weekly Exercise (Minutes/Week): 300, Intensity: Mild, Exercise limited by: orthopedic condition(s)   Goals Addressed             This Visit's Progress    Patient Stated   On track    Continue current  lifestyle habits       Depression Screen  08/02/2022    2:41 PM 05/30/2022    1:40 PM 01/24/2022    1:58 PM 06/19/2021   11:41 AM 12/01/2020   12:36 PM 02/26/2020    1:33 PM 10/27/2019    9:02 AM  PHQ 2/9 Scores  PHQ - 2 Score 0 0 0 0 0 0 0  PHQ- 9 Score   2  0 0 0    Fall Risk    08/02/2022    2:37 PM 05/30/2022    1:41 PM 01/24/2022    1:58 PM 12/01/2020   12:36 PM 02/26/2020    1:34 PM  [place] in the past year? '1 1 1 '$ 0 0  Number falls in past yr: 1 1 0 0 0  Injury with Fall? '1 1 1 '$ 0 0  Risk for fall due to : History of fall(s);Impaired balance/gait;Orthopedic patient Other (Comment) History of fall(s);Impaired balance/gait No Fall Risks   Risk for fall due to: Comment  First fall: tripped over bedroom slippers. Second fall: missed a step.     Follow up Education provided;Falls prevention discussed Falls evaluation completed Falls evaluation completed  Falls evaluation completed    FALL RISK PREVENTION PERTAINING TO THE HOME:  Any stairs in or around the home? Yes  If so, are there any without handrails? No  Home free of loose throw rugs in walkways, pet beds, electrical cords, etc? Yes  Adequate lighting in your home to reduce risk of falls? Yes   ASSISTIVE DEVICES UTILIZED TO PREVENT FALLS:  Life alert? No  Use of a cane, walker or w/c? No  Grab bars in the bathroom? No  Shower chair or bench in shower? Yes  Elevated toilet seat or a handicapped toilet? No        08/02/2022    2:42 PM  6CIT Screen  What Year? 0 points  What month? 0 points  What time? 0 points  Count back from 20 0 points  Months in reverse 0 points  Repeat phrase 0 points  Total Score 0 points    Immunizations Immunization History  Administered Date(s) Administered   Fluad Quad(high Dose 65+) 07/16/2021   Influenza, High Dose Seasonal PF 07/17/2017, 07/13/2018, 06/10/2019, 08/06/2020   Influenza,inj,Quad PF,6+ Mos 07/11/2016   Influenza-Unspecified 08/12/2014, 07/19/2015,  01/08/2022, 01/10/2022   PFIZER Comirnaty(Gray Top)Covid-19 Tri-Sucrose Vaccine 02/13/2021   PFIZER(Purple Top)SARS-COV-2 Vaccination 11/11/2019, 12/02/2019, 06/26/2020   PNEUMOCOCCAL CONJUGATE-20 04/17/2021   Pfizer Covid-19 Vaccine Bivalent Booster 8yr & up 07/16/2021   Pneumococcal Conjugate-13 08/12/2014   Pneumococcal Polysaccharide-23 09/10/2012, 10/31/2019   Tdap 08/12/2014, 06/13/2022   Zoster Recombinat (Shingrix) 09/02/2017, 09/27/2019   Zoster, Live 01/18/2012    TDAP status: Up to date  Flu Vaccine status: Up to date  Pneumococcal vaccine status: Up to date  Covid-19 vaccine status: Completed vaccines  Qualifies for Shingles Vaccine? Yes   Zostavax completed Yes   Shingrix Completed?: Yes  Screening Tests Health Maintenance  Topic Date Due   INFLUENZA VACCINE  05/22/2022   COVID-19 Vaccine (6 - Pfizer series) 10/21/2022 (Originally 11/15/2021)   TETANUS/TDAP  06/13/2032   Pneumonia Vaccine 76 Years old  Completed   DEXA SCAN  Completed   Hepatitis C Screening  Completed   Zoster Vaccines- Shingrix  Completed   HPV VACCINES  Aged Out   COLONOSCOPY (Pts 45-457yrInsurance coverage will need to be confirmed)  Discontinued    Health Maintenance  Health Maintenance Due  Topic Date Due   INFLUENZA VACCINE  05/22/2022  Colorectal cancer screening: No longer required.   Mammogram status: No longer required due to age.  Bone Density status: Completed 06/12/2021. Results reflect: Bone density results: OSTEOPOROSIS. Repeat every 2 years.  Lung Cancer Screening: (Low Dose CT Chest recommended if Age 76-80 years, 30 pack-year currently smoking OR have quit w/in 15years.) does not qualify.   Lung Cancer Screening Referral: n/a  Additional Screening:  Hepatitis C Screening: does not qualify; Completed 07/11/2016  Vision Screening: Recommended annual ophthalmology exams for early detection of glaucoma and other disorders of the eye. Is the patient up to  date with their annual eye exam?  Yes  Who is the provider or what is the name of the office in which the patient attends annual eye exams? Dr.Oman  If pt is not established with a provider, would they like to be referred to a provider to establish care? No .   Dental Screening: Recommended annual dental exams for proper oral hygiene  Community Resource Referral / Chronic Care Management: CRR required this visit?  No   CCM required this visit?  No      Plan:     I have personally reviewed and noted the following in the patient's chart:   Medical and social history Use of alcohol, tobacco or illicit drugs  Current medications and supplements including opioid prescriptions. Patient is not currently taking opioid prescriptions. Functional ability and status Nutritional status Physical activity Advanced directives List of other physicians Hospitalizations, surgeries, and ER visits in previous 12 months Vitals Screenings to include cognitive, depression, and falls Referrals and appointments  In addition, I have reviewed and discussed with patient certain preventive protocols, quality metrics, and best practice recommendations. A written personalized care plan for preventive services as well as general preventive health recommendations were provided to patient.     Daphane Shepherd, LPN   [id number]   Nurse Notes: none

## 2022-08-06 DIAGNOSIS — M545 Low back pain, unspecified: Secondary | ICD-10-CM | POA: Diagnosis not present

## 2022-08-24 DIAGNOSIS — L814 Other melanin hyperpigmentation: Secondary | ICD-10-CM | POA: Diagnosis not present

## 2022-08-24 DIAGNOSIS — L309 Dermatitis, unspecified: Secondary | ICD-10-CM | POA: Diagnosis not present

## 2022-08-24 DIAGNOSIS — L821 Other seborrheic keratosis: Secondary | ICD-10-CM | POA: Diagnosis not present

## 2022-10-01 ENCOUNTER — Encounter: Payer: PPO | Admitting: Plastic Surgery

## 2022-10-26 ENCOUNTER — Telehealth: Payer: Self-pay | Admitting: Family Medicine

## 2022-10-26 ENCOUNTER — Other Ambulatory Visit: Payer: Self-pay | Admitting: Family Medicine

## 2022-10-26 NOTE — Telephone Encounter (Signed)
error 

## 2022-12-12 ENCOUNTER — Ambulatory Visit (INDEPENDENT_AMBULATORY_CARE_PROVIDER_SITE_OTHER): Payer: PPO | Admitting: Family Medicine

## 2022-12-12 ENCOUNTER — Encounter: Payer: Self-pay | Admitting: Family Medicine

## 2022-12-12 VITALS — BP 134/78 | HR 77 | Temp 98.6°F | Resp 16 | Ht 64.0 in | Wt 119.4 lb

## 2022-12-12 DIAGNOSIS — M8580 Other specified disorders of bone density and structure, unspecified site: Secondary | ICD-10-CM | POA: Diagnosis not present

## 2022-12-12 DIAGNOSIS — E039 Hypothyroidism, unspecified: Secondary | ICD-10-CM | POA: Diagnosis not present

## 2022-12-12 DIAGNOSIS — Z0000 Encounter for general adult medical examination without abnormal findings: Secondary | ICD-10-CM | POA: Diagnosis not present

## 2022-12-12 LAB — TSH: TSH: 3.46 u[IU]/mL (ref 0.35–5.50)

## 2022-12-12 LAB — CBC WITH DIFFERENTIAL/PLATELET
Basophils Absolute: 0 10*3/uL (ref 0.0–0.1)
Basophils Relative: 0.6 % (ref 0.0–3.0)
Eosinophils Absolute: 0.1 10*3/uL (ref 0.0–0.7)
Eosinophils Relative: 2.2 % (ref 0.0–5.0)
HCT: 40.8 % (ref 36.0–46.0)
Hemoglobin: 14.2 g/dL (ref 12.0–15.0)
Lymphocytes Relative: 24.8 % (ref 12.0–46.0)
Lymphs Abs: 1.4 10*3/uL (ref 0.7–4.0)
MCHC: 34.8 g/dL (ref 30.0–36.0)
MCV: 97.5 fl (ref 78.0–100.0)
Monocytes Absolute: 0.7 10*3/uL (ref 0.1–1.0)
Monocytes Relative: 12.3 % — ABNORMAL HIGH (ref 3.0–12.0)
Neutro Abs: 3.4 10*3/uL (ref 1.4–7.7)
Neutrophils Relative %: 60.1 % (ref 43.0–77.0)
Platelets: 212 10*3/uL (ref 150.0–400.0)
RBC: 4.18 Mil/uL (ref 3.87–5.11)
RDW: 13.5 % (ref 11.5–15.5)
WBC: 5.6 10*3/uL (ref 4.0–10.5)

## 2022-12-12 LAB — VITAMIN D 25 HYDROXY (VIT D DEFICIENCY, FRACTURES): VITD: 34.82 ng/mL (ref 30.00–100.00)

## 2022-12-12 NOTE — Assessment & Plan Note (Signed)
Check Vit D and replete prn.  UTD on DEXA

## 2022-12-12 NOTE — Assessment & Plan Note (Signed)
Chronic problem.  Currently asymptomatic.  Check labs.  Adjust meds prn  

## 2022-12-12 NOTE — Assessment & Plan Note (Signed)
Pt's PE WNL.  UTD on mammo, DEXA, PNA, shingles, flu.  Check labs.  Anticipatory guidance provided.

## 2022-12-12 NOTE — Patient Instructions (Signed)
Follow up in 1 year or as needed We'll notify you of your lab results and make any changes if needed Keep up the good work on healthy diet and regular exercise- you look great!! Call with any questions or concerns Stay Safe!  Stay Healthy! Happy Spring!!!

## 2022-12-12 NOTE — Progress Notes (Signed)
Subjective:    Patient ID: [name], female    DOB: 12-09-1945, 77 y.o.   MRN: [id number]  HPI CPE- UTD on mammo, DEXA, PNA, shingles, Tdap, flu.  No longer doing colon cancer screening.  Patient Care Team    Relationship Specialty Notifications Start End  Midge Minium, MD PCP - General Family Medicine  01/30/16   Huel Cote, NP (Inactive) Nurse Practitioner Obstetrics and Gynecology  07/11/16   Dennie Bible, NP Nurse Practitioner Family Medicine  07/11/16   Dohmeier, Asencion Partridge, MD Consulting Physician Neurology  07/17/17   Gale Journey, Gwen Her, MD Referring Physician Endocrinology  07/17/17   Kelton Pillar, MD Referring Physician Orthopedic Surgery  07/17/17   Harriett Sine, MD Consulting Physician Dermatology  10/14/19   [place], Heather, [place] Physician Optometry  10/14/19   Linton Rump, PT Physical Therapist Physical Therapy  06/06/20   Edythe Clarity, [facility]  Pharmacist  04/04/22    Comment: [phone]     Health Maintenance  Topic Date Due   Medicare Annual Wellness (AWV)  08/03/2023   DTaP/Tdap/Td (3 - Td or Tdap) 06/13/2032   Pneumonia Vaccine 41+ Years old  Completed   INFLUENZA VACCINE  Completed   DEXA SCAN  Completed   COVID-19 Vaccine  Completed   Hepatitis C Screening  Completed   Zoster Vaccines- Shingrix  Completed   HPV VACCINES  Aged Out   COLONOSCOPY (Pts 45-3yr Insurance coverage will need to be confirmed)  Discontinued      Review of Systems Patient reports no vision/ hearing changes, adenopathy,fever, weight change,  persistant/recurrent hoarseness , swallowing issues, chest pain, palpitations, edema, persistant/recurrent cough, hemoptysis, dyspnea (rest/exertional/paroxysmal nocturnal), gastrointestinal bleeding (melena, rectal bleeding), abdominal pain, significant heartburn, bowel changes, GU symptoms (dysuria, hematuria, incontinence), Gyn symptoms (abnormal  bleeding, pain),  syncope, focal weakness, memory loss,  numbness & tingling, skin/hair/nail changes, abnormal bruising or bleeding, anxiety, or depression.     Objective:   Physical Exam General Appearance:    Alert, cooperative, no distress, appears stated age  Head:    Normocephalic, without obvious abnormality, atraumatic  Eyes:    PERRL, conjunctiva/corneas clear, EOM's intact both eyes  Ears:    Normal TM's and external ear canals, both ears  Nose:   Nares normal, septum midline, mucosa normal, no drainage    or sinus tenderness  Throat:   Lips, mucosa, and tongue normal; teeth and gums normal  Neck:   Supple, symmetrical, trachea midline, no adenopathy;    Thyroid: no enlargement/tenderness/nodules  Back:     Symmetric, no curvature, ROM normal, no CVA tenderness  Lungs:     Clear to auscultation bilaterally, respirations unlabored  Chest Wall:    No tenderness or deformity   Heart:    Regular rate and rhythm, S1 and S2 normal, no murmur, rub   or gallop  Breast Exam:    Deferred to GYN  Abdomen:     Soft, non-tender, bowel sounds active all four quadrants,    no masses, no organomegaly  Genitalia:    Deferred to GYN  Rectal:    Extremities:   Extremities normal, atraumatic, no cyanosis or edema  Pulses:   2+ and symmetric all extremities  Skin:   Skin color, texture, turgor normal, no rashes or lesions  Lymph nodes:   Cervical, supraclavicular, and axillary nodes normal  Neurologic:   CNII-XII intact, normal strength, sensation and reflexes    throughout  Assessment & Plan:

## 2022-12-13 ENCOUNTER — Telehealth: Payer: Self-pay

## 2022-12-13 LAB — BASIC METABOLIC PANEL
BUN: 19 mg/dL (ref 6–23)
CO2: 25 mEq/L (ref 19–32)
Calcium: 10.4 mg/dL (ref 8.4–10.5)
Chloride: 100 mEq/L (ref 96–112)
Creatinine, Ser: 0.66 mg/dL (ref 0.40–1.20)
GFR: 85.11 mL/min (ref >60.00)
Glucose, Bld: 98 mg/dL (ref 70–99)
Potassium: 4.2 mEq/L (ref 3.5–5.1)
Sodium: 138 mEq/L (ref 135–145)

## 2022-12-13 LAB — LIPID PANEL
Cholesterol: 223 mg/dL — ABNORMAL HIGH (ref 0–200)
HDL: 112.5 mg/dL (ref >39.00)
LDL Cholesterol: 92 mg/dL (ref 0–99)
NonHDL: 110.51
Total CHOL/HDL Ratio: 2
Triglycerides: 92 mg/dL (ref 0.0–149.0)
VLDL: 18.4 mg/dL (ref 0.0–40.0)

## 2022-12-13 LAB — HEPATIC FUNCTION PANEL
ALT: 18 U/L (ref 0–35)
AST: 13 U/L (ref 0–37)
Albumin: 4.8 g/dL (ref 3.5–5.2)
Alkaline Phosphatase: 63 U/L (ref 39–117)
Bilirubin, Direct: 0.1 mg/dL (ref 0.0–0.3)
Total Bilirubin: 0.6 mg/dL (ref 0.2–1.2)
Total Protein: 7.6 g/dL (ref 6.0–8.3)

## 2022-12-13 NOTE — Telephone Encounter (Signed)
-----   Message from Midge Minium, MD sent at 12/13/2022 12:18 PM EST ----- Labs look AMAZING!  No changes at this time

## 2022-12-13 NOTE — Telephone Encounter (Signed)
Left lab results  on pt VM

## 2022-12-24 ENCOUNTER — Telehealth: Payer: Self-pay | Admitting: Gastroenterology

## 2022-12-24 NOTE — Telephone Encounter (Signed)
She has not been seen in our office since 2014, her last colonoscopy was norma and at 29 we should discuss. Please schedule an office appt.

## 2022-12-24 NOTE — Telephone Encounter (Signed)
Good afternoon Dr. Fuller Plan,   We received a call from this patient wishing to schedule her recall Endo Colon. However patient is over the age of 71. Would you like to see her in the office first or would I be able to schedule her directly. Please advise.    Thank you.

## 2022-12-25 NOTE — Telephone Encounter (Signed)
Left voicemail for patient to call back and schedule office visit.

## 2023-01-09 ENCOUNTER — Other Ambulatory Visit: Payer: Self-pay

## 2023-01-09 DIAGNOSIS — Z79899 Other long term (current) drug therapy: Secondary | ICD-10-CM

## 2023-03-21 ENCOUNTER — Encounter: Payer: Self-pay | Admitting: Gastroenterology

## 2023-03-21 ENCOUNTER — Ambulatory Visit: Payer: PPO | Admitting: Gastroenterology

## 2023-03-21 VITALS — BP 140/80 | HR 76 | Ht 63.0 in | Wt 124.0 lb

## 2023-03-21 DIAGNOSIS — Z1211 Encounter for screening for malignant neoplasm of colon: Secondary | ICD-10-CM

## 2023-03-21 DIAGNOSIS — Z1212 Encounter for screening for malignant neoplasm of rectum: Secondary | ICD-10-CM

## 2023-03-21 MED ORDER — NA SULFATE-K SULFATE-MG SULF 17.5-3.13-1.6 GM/177ML PO SOLN: 1.0000 | Freq: Once | ORAL | 0 refills | Status: AC

## 2023-03-21 NOTE — Progress Notes (Signed)
Assessment    CRC screening, average risk   Recommendations   Options of colonoscopy or no repeat due to age and no prior history of colon polyps was discussed.  She would like to proceed with colonoscopy. The risks (including bleeding, perforation, infection, missed lesions, medication reactions and possible hospitalization or surgery if complications occur), benefits, and alternatives to colonoscopy with possible biopsy and possible polypectomy were discussed with the patient and they consent to proceed.     HPI   Chief complaint: CRC screening   Patient profile:  [name] is a 77 y.o. female referred by Sheliah Hatch, MD for [facility] screening.  She has no ongoing gastrointestinal complaints.  She is generally healthy and feels well. Denies weight loss, abdominal pain, constipation, diarrhea, change in stool caliber, melena, hematochezia, nausea, vomiting, dysphagia, reflux symptoms, chest pain.   Previous Labs / Imaging::    Latest Ref Rng & Units 12/12/2022    1:58 PM 01/24/2022    2:44 PM 01/14/2022    3:25 AM  CBC  WBC 4.0 - 10.5 K/uL 5.6  6.4  4.8   Hemoglobin 12.0 - 15.0 g/dL 81.1  91.4  78.2   Hematocrit 36.0 - 46.0 % 40.8  36.7  34.0   Platelets 150.0 - 400.0 K/uL 212.0  394.0  143     No results found for: "LIPASE"    Latest Ref Rng & Units 12/12/2022    1:58 PM 01/24/2022    2:44 PM 01/14/2022    3:25 AM  CMP  Glucose 70 - 99 mg/dL 98  93  88   BUN 6 - 23 mg/dL 19  16  18    Creatinine 0.40 - 1.20 mg/dL 9.56  2.13  0.86   Sodium 135 - 145 mEq/L 138  137  136   Potassium 3.5 - 5.1 mEq/L 4.2  4.2  3.2   Chloride 96 - 112 mEq/L 100  100  103   CO2 19 - 32 mEq/L 25  27  24    Calcium 8.4 - 10.5 mg/dL 57.8  46.9  8.5   Total Protein 6.0 - 8.3 g/dL 7.6   5.6   Total Bilirubin 0.2 - 1.2 mg/dL 0.6   0.7   Alkaline Phos 39 - 117 U/L 63   31   AST 0 - 37 U/L 13   12   ALT 0 - 35 U/L 18   21      Previous GI evaluation    Endoscopies:  Colonoscopy May  2014 Normal  EGD September 2014 Small hiatal hernia Variable Z-line - biopsied showing mild inflammation  Imaging:     Past Medical History:  Diagnosis Date   Arthritis    Dr Corliss Skains   Cataract    Fracture 10/2016   left ankel   History of hiatal hernia    found on endoscopy   Hypothyroidism    Infected laceration 05/30/2022   Insomnia due to anxiety and fear    Snoring 09/29/2014   Past Surgical History:  Procedure Laterality Date   ABDOMINAL HYSTERECTOMY  1987   TAH,BSO, APPENDECTOMY   APPENDECTOMY  1987   APPENDECTOMY AT TAH,BSO   CATARACT EXTRACTION, BILATERAL Bilateral    20 plus years ago   COLONOSCOPY      X 3;  GI. All negative   KNEE ARTHROSCOPY Bilateral    METATARSAL OSTEOTOMY WITH BUNIONECTOMY Left 10/31/2017   Procedure: Left First Metatarsal Scarf, Modified McBride and Akin Osteotomies;  Surgeon: Toni Arthurs, MD;  Location: [facility];  Service: Orthopedics;  Laterality: Left;   REPLACEMENT TOTAL KNEE Bilateral    X 1 each; Dr Gwenith Spitz CUFF REPAIR Right 2010   Family History  Problem Relation Age of Onset   Ovarian cancer Mother 48   Alcohol abuse Father    Cirrhosis Father    Stroke Sister 69   Diabetes Paternal Grandmother    Heart disease Neg Hx    Hypertension Neg Hx    Hyperlipidemia Neg Hx    Colon cancer Neg Hx    Esophageal cancer Neg Hx    Social History   Tobacco Use   Smoking status: Never   Smokeless tobacco: Never  Vaping Use   Vaping Use: Never used  Substance Use Topics   Alcohol use: Yes    Alcohol/week: 14.0 standard drinks of alcohol    Types: 14 Standard drinks or equivalent per week    Comment: 1 drink nightly   Drug use: No   Current Outpatient Medications  Medication Sig Dispense Refill   Calcium Carb-Cholecalciferol (CALCIUM + D3 PO) Take 1 capsule by mouth 3 (three) times daily.     desonide (DESOWEN) 0.05 % ointment      diclofenac Sodium (VOLTAREN) 1 % GEL Apply 2 g topically  4 (four) times daily as needed (for soreness or pain).     ergocalciferol (VITAMIN D2) 1.25 MG (50000 UT) capsule Take 1 capsule by mouth daily.     Evening Primrose Oil CAPS Take 2 capsules by mouth daily.     GLUCOSAMINE PO Take 3,000 mg by mouth daily.     levothyroxine (SYNTHROID) 50 MCG tablet TAKE 1 TABLET(50 MCG) BY MOUTH DAILY BEFORE BREAKFAST 90 tablet 1   MILK THISTLE PO Take 1 tablet by mouth 2 (two) times daily.     Multiple Vitamin (MULTIVITAMIN) capsule Take 1 capsule by mouth daily.     Omega-3 Fatty Acids (OMEGA 3 PO) Take 3,600 mg by mouth daily.     QUEtiapine (SEROQUEL) 25 MG tablet TAKE 3 TABLETS BY MOUTH AT BEDTIME 270 tablet 3   risedronate (ACTONEL) 35 MG tablet Take 35 mg by mouth once a week.     tiZANidine (ZANAFLEX) 4 MG tablet TAKE 1 TABLET(4 MG) BY MOUTH AT BEDTIME 90 tablet 3   triamcinolone cream (KENALOG) 0.1 % Apply 1 application. topically 2 (two) times daily as needed (to eczema flares).     No current facility-administered medications for this visit.   Allergies  Allergen Reactions   Codeine Nausea And Vomiting   Formaldehyde Other (See Comments)    Per allergy testing, tested allergic    Review of Systems: All other systems reviewed and negative except where noted in HPI.    Physical Exam    Wt Readings from Last 3 Encounters:  03/21/23 124 lb (56.2 kg)  12/12/22 119 lb 6 oz (54.1 kg)  08/02/22 116 lb (52.6 kg)    BP (!) 140/80   Pulse 76   Ht 5\' 3"  (1.6 m)   Wt 124 lb (56.2 kg)   BMI 21.97 kg/m  Constitutional:  Generally well appearing female in no acute distress. HEENT: Pupils normal.  Conjunctivae are normal. No scleral icterus. No oral lesions or deformities noted.  Neck: Supple.  Cardiac: Normal rate, regular rhythm without murmurs. Pulmonary/chest: Effort normal and breath sounds normal. No wheezing, rales or rhonchi. Abdominal: Soft, nondistended, nontender. Active bowel sounds. No palpable HSM, masses or  hernias. Rectal:  deferred to colonoscopy Extremities: No edema or deformities noted Neurological: Alert and oriented to person, place and time. Psychiatric: Pleasant. Normal mood and affect. Behavior is normal. Skin: Skin is warm and dry. No rashes noted.  Claudette Head, MD   cc:  Referring Provider Sheliah Hatch, MD

## 2023-03-21 NOTE — Patient Instructions (Signed)
You have been scheduled for a colonoscopy. Please follow written instructions given to you at your visit today.  Please pick up your prep supplies at the pharmacy within the next 1-3 days. If you use inhalers (even only as needed), please bring them with you on the day of your procedure.  The [facility] providers would like to encourage you to use MYCHART to communicate with providers for non-urgent requests or questions.  Due to long hold times on the telephone, sending your provider a message by MYCHART may be a faster and more efficient way to get a response.  Please allow 48 business hours for a response.  Please remember that this is for non-urgent requests.   Due to recent changes in healthcare laws, you may see the results of your imaging and laboratory studies on MyChart before your provider has had a chance to review them.  We understand that in some cases there may be results that are confusing or concerning to you. Not all laboratory results come back in the same time frame and the provider may be waiting for multiple results in order to interpret others.  Please give us 48 hours in order for your provider to thoroughly review all the results before contacting the office for clarification of your results.   Thank you for choosing me and [facility].  Malcolm T. Stark, Jr., MD., FACG  

## 2023-04-01 ENCOUNTER — Encounter: Payer: Self-pay | Admitting: Nurse Practitioner

## 2023-04-01 ENCOUNTER — Ambulatory Visit (INDEPENDENT_AMBULATORY_CARE_PROVIDER_SITE_OTHER): Payer: PPO | Admitting: Nurse Practitioner

## 2023-04-01 VITALS — BP 110/72 | HR 86 | Ht 62.0 in | Wt 123.0 lb

## 2023-04-01 DIAGNOSIS — M8589 Other specified disorders of bone density and structure, multiple sites: Secondary | ICD-10-CM

## 2023-04-01 DIAGNOSIS — Z01419 Encounter for gynecological examination (general) (routine) without abnormal findings: Secondary | ICD-10-CM | POA: Diagnosis not present

## 2023-04-01 DIAGNOSIS — Z780 Asymptomatic menopausal state: Secondary | ICD-10-CM

## 2023-04-01 NOTE — Progress Notes (Signed)
   [name] 13-Jul-1946 [id number]   History:  77 y.o. G0 presents for breast and pelvic exam. No GYN complaints. S/P 1987 TAH BSO for endometriosis - no HRT. Normal pap and mammogram history. Osteopenia with previously elevated FRAX, fractured pelvic bone March 2023, started on Actonel, managed by Dr. Valarie Cones at [facility]. HTN, hypothyroidism managed by PCP.   Gynecologic History No LMP recorded. Patient has had a hysterectomy.   Contraception: status post hysterectomy Sexually active: No  Health Maintenance Last Pap: 01/08/2011 Last mammogram: 04/19/2022. Results were: Normal Last colonoscopy: 03/10/2013. Results were: Normal Last Dexa: 06/12/2021. Results were: T-score -1.8, FRAX 12% / 2.7%  Past medical history, past surgical history, family history and social history were all reviewed and documented in the EPIC chart. Married. Has horses and dogs. Mother deceased from ovarian cancer.   ROS:  A ROS was performed and pertinent positives and negatives are included.  Exam:  Vitals:   04/01/23 1019  BP: 110/72  Pulse: 86  SpO2: 98%  Weight: 123 lb (55.8 kg)  Height: 5\' 2"  (1.575 m)    Body mass index is 22.5 kg/m.  General appearance:  Normal Thyroid:  Symmetrical, normal in size, without palpable masses or nodularity. Respiratory  Auscultation:  Clear without wheezing or rhonchi Cardiovascular  Auscultation:  Regular rate, without rubs, murmurs or gallops  Edema/varicosities:  Not grossly evident Abdominal  Soft,nontender, without masses, guarding or rebound.  Liver/spleen:  No organomegaly noted  Hernia:  None appreciated  Skin  Inspection:  Grossly normal Breasts: Examined lying and sitting.   Right: Without masses, retractions, nipple discharge or axillary adenopathy.   Left: Without masses, retractions, nipple discharge or axillary adenopathy. Genitourinary   Inguinal/mons:  Normal without inguinal adenopathy  External genitalia:  Normal appearing vulva with no  masses, tenderness, or lesions  BUS/Urethra/Skene's glands:  Normal  Vagina:  Normal appearing with normal color and discharge, no lesions. Atrophic changes  Cervix:  Absent  Uterus:  Absent  Adnexa/parametria:     Rt: Normal in size, without masses or tenderness.   Lt: Normal in size, without masses or tenderness.  Anus and perineum: Normal  Digital rectal exam: Deferred  Patient informed chaperone available to be present for breast and pelvic exam. Patient has requested no chaperone to be present. Patient has been advised what will be completed during breast and pelvic exam.   Assessment/Plan:  77 y.o. G0 for breast and pelvic exam.   Encounter for breast and pelvic examination - Education provided on SBEs, importance of preventative screenings, current guidelines, high calcium diet, regular exercise, and multivitamin daily. Labs with PCP.   Osteopenia of multiple sites - Dexa 05/2021 T-score -1.8, FRAX 12% / 2.7%. Started on Actonal March 2023 after pelvic fracture. Managed by Dr. Valarie Cones at [facility]. Very active.  Postmenopausal - no HRT. S/P 1987 [facility] BSO for endometriosis  Screening for cervical cancer - Normal Pap history. No longer screening per guidelines.   Screening for breast cancer - Normal mammogram history.  Continue annual screenings.  Normal breast exam today.  Screening for colon cancer - 2014 colonoscopy. Colonoscopy scheduled in July.   Return in 2 years for breast and pelvic exam.    Olivia Mackie DNP, 10:29 AM 04/01/2023

## 2023-04-02 ENCOUNTER — Telehealth: Payer: Self-pay | Admitting: Pharmacist

## 2023-04-02 NOTE — Progress Notes (Signed)
Care Management & Coordination Services Pharmacy Team   Reason for Encounter: Appointment Reminder   Contacted patient to confirm telephone appointment with Erskine Emery, PharmD on 04/03/23 at [facility] with family on 04/02/2023     Star Rating Drugs:  Medication:  Last Fill: Day Supply  No Star Rating Drugs   Care Gaps: Annual wellness visit in last year? Yes done 08/02/22  If Diabetic: N/A Last eye exam / retinopathy screening: Last diabetic foot exam:   Future Appointments  Date Time Provider Department Center  04/03/2023  2:00 PM Erroll Luna, [facility] None  05/07/2023 11:00 AM Meryl Dare, MD [facility]  12/16/2023  9:20 AM Sheliah Hatch, MD [facility]    [facility]

## 2023-04-03 ENCOUNTER — Ambulatory Visit: Payer: PPO | Admitting: Pharmacist

## 2023-04-03 ENCOUNTER — Telehealth: Payer: PPO

## 2023-04-03 NOTE — Progress Notes (Signed)
Care Management & Coordination Services Pharmacy Note  04/03/2023 Name:  [name] MRN:  [id number] DOB:  [birth year]  Summary: PharmD FU visit.  Patient doing well continues to take Actonel.  DEXA reportedly already scheduled for this year  Recommendations/Changes made from today's visit: No changes FU on DEXA to ensure completion - treat accordingly   Subjective: [name] is an 77 y.o. year old female who is a primary patient of Tabori, Helane Rima, MD.  The care coordination team was consulted for assistance with disease management and care coordination needs.    Engaged with patient by telephone for follow up visit.    Objective:  Lab Results  Component Value Date   CREATININE 0.66 12/12/2022   BUN 19 12/12/2022   GFR 85.11 12/12/2022   GFRNONAA >60 01/14/2022   GFRAA 88 09/23/2013   NA 138 12/12/2022   K 4.2 12/12/2022   CALCIUM 10.4 12/12/2022   CO2 25 12/12/2022   GLUCOSE 98 12/12/2022    Lab Results  Component Value Date/Time   GFR 85.11 12/12/2022 01:58 PM   GFR 85.64 01/24/2022 02:44 PM    Last diabetic Eye exam: No results found for: "HMDIABEYEEXA"  Last diabetic Foot exam: No results found for: "HMDIABFOOTEX"   Lab Results  Component Value Date   CHOL 223 (H) 12/12/2022   HDL 112.50 12/12/2022   LDLCALC 92 12/12/2022   TRIG 92.0 12/12/2022   CHOLHDL 2 12/12/2022       Latest Ref Rng & Units 12/12/2022    1:58 PM 01/14/2022    3:25 AM 01/13/2022    1:05 PM  Hepatic Function  Total Protein 6.0 - 8.3 g/dL 7.6  5.6  6.8   Albumin 3.5 - 5.2 g/dL 4.8  3.3  4.2   AST 0 - 37 U/L 13  12  15    ALT 0 - 35 U/L 18  21  28    Alk Phosphatase 39 - 117 U/L 63  31  41   Total Bilirubin 0.2 - 1.2 mg/dL 0.6  0.7  1.0   Bilirubin, Direct 0.0 - 0.3 mg/dL 0.1       Lab Results  Component Value Date/Time   TSH 3.46 12/12/2022 01:58 PM   TSH 4.02 12/01/2020 01:14 PM   FREET4 0.67 06/15/2013 11:20 AM       Latest Ref Rng & Units 12/12/2022    1:58  PM 01/24/2022    2:44 PM 01/14/2022    3:25 AM  CBC  WBC 4.0 - 10.5 K/uL 5.6  6.4  4.8   Hemoglobin 12.0 - 15.0 g/dL 86.5  78.4  69.6   Hematocrit 36.0 - 46.0 % 40.8  36.7  34.0   Platelets 150.0 - 400.0 K/uL 212.0  394.0  143     Lab Results  Component Value Date/Time   VD25OH 34.82 12/12/2022 01:58 PM   VD25OH 35.19 12/01/2020 01:14 PM    Clinical ASCVD: No  The ASCVD Risk score (Arnett DK, et al., 2019) failed to calculate for the following reasons:   The valid HDL cholesterol range is 20 to 100 mg/dL        2/95/2841    3:24 PM 08/02/2022    2:41 PM 05/30/2022    1:40 PM  Depression screen PHQ 2/9  Decreased Interest 0 0 0  Down, Depressed, Hopeless 0 0 0  PHQ - 2 Score 0 0 0  Altered sleeping 0    Tired, decreased energy 0  Change in appetite 0    Feeling bad or failure about yourself  0    Trouble concentrating 0    Moving slowly or fidgety/restless 0    Suicidal thoughts 0    PHQ-9 Score 0    Difficult doing work/chores Not difficult at all       Social History   Tobacco Use  Smoking Status Never  Smokeless Tobacco Never   BP Readings from Last 3 Encounters:  04/01/23 110/72  03/21/23 (!) 140/80  12/12/22 134/78   Pulse Readings from Last 3 Encounters:  04/01/23 86  03/21/23 76  12/12/22 77   Wt Readings from Last 3 Encounters:  04/01/23 123 lb (55.8 kg)  03/21/23 124 lb (56.2 kg)  12/12/22 119 lb 6 oz (54.1 kg)   BMI Readings from Last 3 Encounters:  04/01/23 22.50 kg/m  03/21/23 21.97 kg/m  12/12/22 20.49 kg/m    Allergies  Allergen Reactions   Codeine Nausea And Vomiting   Formaldehyde Other (See Comments)    Per allergy testing, tested allergic    Medications Reviewed Today     Reviewed by Olivia Mackie, NP (Nurse Practitioner) on 04/01/23 at 1028  Med List Status: <None>   Medication Order Taking? Sig Documenting Provider Last Dose Status Informant  Calcium Carb-Cholecalciferol (CALCIUM + D3 PO) [id number] Yes Take 1  capsule by mouth 3 (three) times daily. [provider] Taking Active Self  desonide (DESOWEN) 0.05 % ointment [id number] Yes  [provider] Taking Active   diclofenac Sodium (VOLTAREN) 1 % GEL [id number] Yes Apply 2 g topically 4 (four) times daily as needed (for soreness or pain). [provider] Taking Active Self  ergocalciferol (VITAMIN D2) 1.25 MG (50000 UT) capsule [id number] Yes Take 1 capsule by mouth daily. [provider] Taking Active   Evening Primrose Oil CAPS [id number] Yes Take 2 capsules by mouth daily. [provider] Taking Active Self  GLUCOSAMINE PO [id number] Yes Take 3,000 mg by mouth daily. [provider] Taking Active Self  levothyroxine (SYNTHROID) 50 MCG tablet [id number] Yes TAKE 1 TABLET(50 MCG) BY MOUTH DAILY BEFORE BREAKFAST Sheliah Hatch, MD Taking Active   MILK THISTLE PO [id number] Yes Take 1 tablet by mouth 2 (two) times daily. [provider] Taking Active Self  Multiple Vitamin (MULTIVITAMIN) capsule [id number] Yes Take 1 capsule by mouth daily. [provider] Taking Active Self  Omega-3 Fatty Acids (OMEGA 3 PO) [id number] Yes Take 3,600 mg by mouth daily. [provider] Taking Active Self  QUEtiapine (SEROQUEL) 25 MG tablet [id number] Yes TAKE 3 TABLETS BY MOUTH AT BEDTIME Dohmeier, Porfirio Mylar, MD Taking Active   risedronate (ACTONEL) 35 MG tablet [id number] Yes Take 35 mg by mouth once a week. [provider] Taking Active   tiZANidine (ZANAFLEX) 4 MG tablet [id number] Yes TAKE 1 TABLET(4 MG) BY MOUTH AT BEDTIME Dohmeier, Porfirio Mylar, MD Taking Active   triamcinolone cream (KENALOG) 0.1 % [id number] Yes Apply 1 application. topically 2 (two) times daily as needed (to eczema flares). [provider] Taking Active Self            SDOH:  (Social Determinants of Health) assessments and interventions performed:  Financial Resource Strain: Low Risk  (08/02/2022)   Overall  Financial Resource Strain (CARDIA)    Difficulty of Paying Living Expenses: Not hard at all   Food Insecurity: No Food Insecurity (08/02/2022)   Hunger Vital Sign    Worried About Running Out of Food in the Last Year:  Never true    Ran Out of Food in the Last Year: Never true    SDOH Interventions    Flowsheet Row Clinical Support from 08/02/2022 in [facility] Clinical Support from 06[facility]19/2021 in [facility] Chronic Care Management from 03/28/2020 in [facility]  SDOH Interventions     Food Insecurity Interventions Intervention Not Indicated Intervention Not Indicated Other (Comment)  Housing Interventions Intervention Not Indicated Intervention Not Indicated --  Transportation Interventions Intervention Not Indicated Intervention Not Indicated Other (Comment)  Alcohol Usage Interventions Intervention Not Indicated (Score <7) -- --  Financial Strain Interventions Intervention Not Indicated Intervention Not Indicated --  Physical Activity Interventions Intervention Not Indicated Intervention Not Indicated --  Stress Interventions Intervention Not Indicated Intervention Not Indicated --  Social Connections Interventions Intervention Not Indicated Intervention Not Indicated --       Medication Assistance: None required.  Patient affirms current coverage meets needs.  Medication Access: Name and location of current pharmacy:  [id number] - [place], [place] - [address] [place] [address] Phone: [phone] Fax: [phone]  Within the past 30 days, how often has patient missed a dose of medication? 0 Is a pillbox or other method used to improve adherence? Yes  Factors that may affect medication adherence? no barriers identified Are meds synced by current pharmacy? No  Are meds  delivered by current pharmacy? No  Does patient experience delays in picking up medications due to transportation concerns? No   Compliance/Adherence/Medication fill history: Care Gaps: None  Star-Rating Drugs: No star meds   Assessment/Plan     Insomnia (Goal: optimize medication safety, ensure good sleep hygiene) -Controlled -Wakes up in middle of the night then goes back to sleep - has done this entire life. Feeling well rested most days, no interference with daily activities. -No daytime naps  -Current treatment: Quetiapine 75 mg once daily before bed Appropriate, Effective, Safe, Accessible -Educated on Benefits of medication for symptom control Reviewed for side effects - no problems noted  -Recommended to continue current medication    Osteopenia (Goal ensure appropriate supplementation) 04/03/23 -Controlled, taking lower dose Actonel now -Last DEXA Scan: 05/2021  -Very active. Does piliates, works out with trainer 2x/wk -Patient is not a candidate for pharmacologic treatment -Current treatment  Vitamin D3-Calcium supplement once daily Risedronate 35mg  once per month Appropriate, Effective, Safe, Accessible -Medications previously tried: n/a  -Recommend [phone] units of vitamin D daily. Recommend 1200 mg of calcium daily from dietary and supplemental sources. Recommend weight-bearing and muscle strengthening exercises for building and maintaining bone density. -Continue on her Actonel.  Will be due for updated DEXA in August of 2024. -Patient has already scheduled this visit. -She has not had any more falls since previous incident.  Update 04/04/22 Has taken ever since her fall. She reports compliance with this. Due for updated DEXA scan August 2024. Until then cautioned her to be careful/avoid falls. She is recovering from fracture well, graduating to her cane from a walker this week. No longer on pain medication.  Hypothyroidism (Goal: maintain stable  tsh) -Controlled -Current treatment  Levothyroxine 50 mcg once daily Appropriate, Effective, Safe, Accessible -Medications previously tried: n/a  -Recommended to continue current medication Counseled on appropriate use of levothyroxine - takes when waking up in the middle of night - has done this consistently,  no fluctuations in TSH. Side effect review - no problems noted   Update 04/04/22 Same dose, TSH is WNL. No need for changes at this time. Continue to take as current.         Willa Frater, PharmD Clinical Pharmacist  [facility] [phone]

## 2023-04-12 DIAGNOSIS — Z1231 Encounter for screening mammogram for malignant neoplasm of breast: Secondary | ICD-10-CM | POA: Diagnosis not present

## 2023-04-19 ENCOUNTER — Other Ambulatory Visit: Payer: Self-pay | Admitting: Family Medicine

## 2023-04-26 ENCOUNTER — Other Ambulatory Visit: Payer: Self-pay | Admitting: Neurology

## 2023-04-29 ENCOUNTER — Encounter: Payer: Self-pay | Admitting: Nurse Practitioner

## 2023-05-02 ENCOUNTER — Ambulatory Visit: Payer: PPO | Admitting: Neurology

## 2023-05-07 ENCOUNTER — Ambulatory Visit (AMBULATORY_SURGERY_CENTER): Payer: PPO | Admitting: Gastroenterology

## 2023-05-07 ENCOUNTER — Encounter: Payer: Self-pay | Admitting: Gastroenterology

## 2023-05-07 VITALS — BP 140/77 | HR 70 | Temp 98.0°F | Resp 11 | Ht 62.0 in | Wt 123.0 lb

## 2023-05-07 DIAGNOSIS — K635 Polyp of colon: Secondary | ICD-10-CM | POA: Diagnosis not present

## 2023-05-07 DIAGNOSIS — E039 Hypothyroidism, unspecified: Secondary | ICD-10-CM | POA: Diagnosis not present

## 2023-05-07 DIAGNOSIS — D125 Benign neoplasm of sigmoid colon: Secondary | ICD-10-CM | POA: Diagnosis not present

## 2023-05-07 DIAGNOSIS — D123 Benign neoplasm of transverse colon: Secondary | ICD-10-CM | POA: Diagnosis not present

## 2023-05-07 DIAGNOSIS — Z1211 Encounter for screening for malignant neoplasm of colon: Secondary | ICD-10-CM | POA: Diagnosis not present

## 2023-05-07 MED ORDER — SODIUM CHLORIDE 0.9 % IV SOLN
500.0000 mL | Freq: Once | INTRAVENOUS | Status: DC
Start: 2023-05-07 — End: 2023-05-07

## 2023-05-07 NOTE — Patient Instructions (Signed)
 YOU HAD AN ENDOSCOPIC PROCEDURE TODAY AT [facility]:   Refer to the procedure report that was given to you for any specific questions about what was found during the examination.  If the procedure report does not answer your questions, please call your gastroenterologist to clarify.  If you requested that your care partner not be given the details of your procedure findings, then the procedure report has been included in a sealed envelope for you to review at your convenience later.  **Handouts given on polyps and diverticulosis**  YOU SHOULD EXPECT: Some feelings of bloating in the abdomen. Passage of more gas than usual.  Walking can help get rid of the air that was put into your GI tract during the procedure and reduce the bloating. If you had a lower endoscopy (such as a colonoscopy or flexible sigmoidoscopy) you may notice spotting of blood in your stool or on the toilet paper. If you underwent a bowel prep for your procedure, you may not have a normal bowel movement for a few days.  Please Note:  You might notice some irritation and congestion in your nose or some drainage.  This is from the oxygen used during your procedure.  There is no need for concern and it should clear up in a day or so.  SYMPTOMS TO REPORT IMMEDIATELY:  Following lower endoscopy (colonoscopy or flexible sigmoidoscopy):  Excessive amounts of blood in the stool  Significant tenderness or worsening of abdominal pains  Swelling of the abdomen that is new, acute  Fever of 100F or higher  For urgent or emergent issues, a gastroenterologist can be reached at any hour by calling [phone]. Do not use MyChart messaging for urgent concerns.    DIET:  We do recommend a small meal at first, but then you may proceed to your regular diet.  Drink plenty of fluids but you should avoid alcoholic beverages for 24 hours.  ACTIVITY:  You should plan to take it easy for the rest of today and you should NOT DRIVE  or use heavy machinery until tomorrow (because of the sedation medicines used during the test).    FOLLOW UP: Our staff will call the number listed on your records the next business day following your procedure.  We will call around 7:15- 8:00 am to check on you and address any questions or concerns that you may have regarding the information given to you following your procedure. If we do not reach you, we will leave a message.     If any biopsies were taken you will be contacted by phone or by letter within the next 1-3 weeks.  Please call us at [phone] if you have not heard about the biopsies in 3 weeks.    SIGNATURES/CONFIDENTIALITY: You and/or your care partner have signed paperwork which will be entered into your electronic medical record.  These signatures attest to the fact that that the information above on your After Visit Summary has been reviewed and is understood.  Full responsibility of the confidentiality of this discharge information lies with you and/or your care-partner.

## 2023-05-07 NOTE — Progress Notes (Signed)
 VS completed by DT.  Pt's states no medical or surgical changes since previsit or office visit.  

## 2023-05-07 NOTE — Op Note (Signed)
[facility] Patient Name: [name] Procedure Date: 05/07/2023 10:35 AM MRN: [id number] Endoscopist: Meryl Dare , MD, [phone] Age: 77 Referring MD:  Date of Birth: 05-Mar-1946 Gender: Female Account #: [id number] Procedure:                Colonoscopy Indications:              Screening for colorectal malignant neoplasm Medicines:                Monitored Anesthesia Care Procedure:                Pre-Anesthesia Assessment:                           - Prior to the procedure, a History and Physical                            was performed, and patient medications and                            allergies were reviewed. The patient's tolerance of                            previous anesthesia was also reviewed. The risks                            and benefits of the procedure and the sedation                            options and risks were discussed with the patient.                            All questions were answered, and informed consent                            was obtained. Prior Anticoagulants: The patient has                            taken no anticoagulant or antiplatelet agents. ASA                            Grade Assessment: II - A patient with mild systemic                            disease. After reviewing the risks and benefits,                            the patient was deemed in satisfactory condition to                            undergo the procedure.                           After obtaining informed consent, the colonoscope  was passed under direct vision. Throughout the                            procedure, the patient's blood pressure, pulse, and                            oxygen saturations were monitored continuously. The                            Olympus Scope [id number] was introduced through the                            anus and advanced to the the cecum, identified by                             appendiceal orifice and ileocecal valve. The                            ileocecal valve, appendiceal orifice, and rectum                            were photographed. The quality of the bowel                            preparation was adequate. The colonoscopy was                            performed without difficulty. The patient tolerated                            the procedure well. Scope In: 10:41:50 AM Scope Out: 11:03:46 AM Scope Withdrawal Time: 0 hours 17 minutes 31 seconds  Total Procedure Duration: 0 hours 21 minutes 56 seconds  Findings:                 The perianal and digital rectal examinations were                            normal.                           Two sessile polyps were found in the sigmoid colon                            and transverse colon. The polyps were 6 to 8 mm in                            size. These polyps were removed with a cold snare.                            Resection and retrieval were complete.                           A few small-mouthed diverticula were found in the  sigmoid colon.                           The exam was otherwise without abnormality on                            direct and retroflexion views. Complications:            No immediate complications. Estimated blood loss:                            None. Estimated Blood Loss:     Estimated blood loss: none. Impression:               - Two 6 to 8 mm polyps in the sigmoid colon and in                            the transverse colon, removed with a cold snare.                            Resected and retrieved.                           - Mild diverticulosis in the sigmoid colon.                           - The examination was otherwise normal on direct                            and retroflexion views. Recommendation:           - Repeat colonoscopy vs no repeat due to age after                            studies are complete for surveillance based on                             pathology results.                           - Patient has a contact number available for                            emergencies. The signs and symptoms of potential                            delayed complications were discussed with the                            patient. Return to normal activities tomorrow.                            Written discharge instructions were provided to the                            patient.                           -  Resume previous diet.                           - Continue present medications.                           - Await pathology results. Meryl Dare, MD 05/07/2023 11:08:03 AM This report has been signed electronically.

## 2023-05-07 NOTE — Progress Notes (Signed)
Vss nad trans to pacu 

## 2023-05-07 NOTE — Progress Notes (Signed)
 Called to room to assist during endoscopic procedure.  Patient ID and intended procedure confirmed with present staff. Received instructions for my participation in the procedure from the performing physician.  

## 2023-05-07 NOTE — Progress Notes (Signed)
History & Physical  Primary Care Physician:  Sheliah Hatch, MD Primary Gastroenterologist: Claudette Head, MD  Impression / Plan:  Average risk CRC screening for colonoscopy.  CHIEF COMPLAINT:  CRC screening  HPI: [name] is a 77 y.o. female average risk CRC screening for colonoscopy.    Past Medical History:  Diagnosis Date   Arthritis    Dr Corliss Skains   Cataract    Fracture 10/2016   left ankel   History of hiatal hernia    found on endoscopy   Hypothyroidism    Infected laceration 05/30/2022   Insomnia due to anxiety and fear    Snoring 09/29/2014    Past Surgical History:  Procedure Laterality Date   ABDOMINAL HYSTERECTOMY  1987   TA[facility],BSO, APPENDECTOMY   APPENDECTOMY  1987   APPENDECTOMY AT [facility],BSO   CATARACT EXTRACTION, BILATERAL Bilateral    20 plus years ago   COLONOSCOPY      X 3; [facility]. All negative   KNEE ARTHROSCOPY Bilateral    METATARSAL OSTEOTOMY WITH BUNIONECTOMY Left 10/31/2017   Procedure: Left First Metatarsal Scarf, Modified McBride and Akin Osteotomies;  Surgeon: Toni Arthurs, MD;  Location: [facility];  Service: Orthopedics;  Laterality: Left;   REPLACEMENT TOTAL KNEE Bilateral    X 1 each; Dr Gwenith Spitz CUFF REPAIR Right 2010    Prior to Admission medications   Medication Sig Start Date End Date Taking? Authorizing Provider  Calcium Carb-Cholecalciferol (CALCIUM + D3 PO) Take 1 capsule by mouth 3 (three) times daily.    [provider]  desonide (DESOWEN) 0.05 % ointment     [provider]  diclofenac Sodium (VOLTAREN) 1 % GEL Apply 2 g topically 4 (four) times daily as needed (for soreness or pain).    [provider]  ergocalciferol (VITAMIN D2) 1.25 MG (50000 UT) capsule Take 1 capsule by mouth daily.    [provider]  Evening Primrose Oil CAPS Take 2 capsules by mouth daily.    [provider]  GLUCOSAMINE PO Take 3,000 mg by mouth daily.    [provider]  levothyroxine (SYNTHROID) 50 MCG tablet TAKE 1 TABLET(50 MCG) BY MOUTH DAILY BEFORE BREAKFAST 04/19/23   Sheliah Hatch, MD  MILK THISTLE PO Take 1 tablet by mouth 2 (two) times daily.    [provider]  Multiple Vitamin (MULTIVITAMIN) capsule Take 1 capsule by mouth daily.    [provider]  Omega-3 Fatty Acids (OMEGA 3 PO) Take 3,600 mg by mouth daily.    [provider]  QUEtiapine (SEROQUEL) 25 MG tablet TAKE 3 TABLETS BY MOUTH AT BEDTIME 04/30/23   Dohmeier, Porfirio Mylar, MD  risedronate (ACTONEL) 35 MG tablet Take 35 mg by mouth once a week. 04/09/22   [provider]  tiZANidine (ZANAFLEX) 4 MG tablet TAKE 1 TABLET(4 MG) BY MOUTH AT BEDTIME 05/01/22   Dohmeier, Porfirio Mylar, MD  triamcinolone cream (KENALOG) 0.1 % Apply 1 application. topically 2 (two) times daily as needed (to eczema flares).    [provider]    Current Outpatient Medications  Medication Sig Dispense Refill   Calcium Carb-Cholecalciferol (CALCIUM + D3 PO) Take 1 capsule by mouth 3 (three) times daily.     desonide (DESOWEN) 0.05 % ointment      diclofenac Sodium (VOLTAREN) 1 % GEL Apply 2 g topically 4 (four) times daily as needed (for soreness or pain).     ergocalciferol (VITAMIN D2) 1.25  MG (50000 UT) capsule Take 1 capsule by mouth daily.     Evening Primrose Oil CAPS Take 2 capsules by mouth daily.     GLUCOSAMINE PO Take 3,000 mg by mouth daily.     levothyroxine (SYNTHROID) 50 MCG tablet TAKE 1 TABLET(50 MCG) BY MOUTH DAILY BEFORE BREAKFAST 90 tablet 1   MILK THISTLE PO Take 1 tablet by mouth 2 (two) times daily.     Multiple Vitamin (MULTIVITAMIN) capsule Take 1 capsule by mouth daily.     Omega-3 Fatty Acids (OMEGA 3 PO) Take 3,600 mg by mouth daily.     QUEtiapine (SEROQUEL) 25 MG tablet TAKE 3 TABLETS BY MOUTH AT BEDTIME 270 tablet 0   risedronate (ACTONEL) 35 MG tablet Take 35 mg by mouth once a week.     tiZANidine (ZANAFLEX) 4 MG tablet TAKE 1  TABLET(4 MG) BY MOUTH AT BEDTIME 90 tablet 3   triamcinolone cream (KENALOG) 0.1 % Apply 1 application. topically 2 (two) times daily as needed (to eczema flares).     Current Facility-Administered Medications  Medication Dose Route Frequency Provider Last Rate Last Admin   0.9 %  sodium chloride infusion  500 mL Intravenous Once Meryl Dare, MD        Allergies as of 05/07/2023 - Review Complete 05/07/2023  Allergen Reaction Noted   Codeine Nausea And Vomiting 01/03/2012   Formaldehyde Other (See Comments) 01/19/2014    Family History  Problem Relation Age of Onset   Ovarian cancer Mother 23   Alcohol abuse Father    Cirrhosis Father    Stroke Sister 40   Diabetes Paternal Grandmother    Heart disease Neg Hx    Hypertension Neg Hx    Hyperlipidemia Neg Hx    Colon cancer Neg Hx    Esophageal cancer Neg Hx     Social History   Socioeconomic History   Marital status: Married    Spouse name: Not on file   Number of children: 0   Years of education: Not on file   Highest education level: Not on file  Occupational History   Occupation: retired    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 Beverely Low    Social Determinants of Health   Financial Resource Strain: Low Risk  (08/02/2022)   Overall Financial Resource Strain (CARDIA)    Difficulty of Paying Living Expenses: Not hard at all  Food Insecurity: No Food Insecurity (08/02/2022)   Hunger Vital Sign    Worried  About Running Out of Food in the Last Year: Never true    Ran Out of Food in the Last Year: Never true  Transportation Needs: No Transportation Needs (08/02/2022)   PRAPARE - Administrator, Civil Service (Medical): No    Lack of Transportation (Non-Medical): No  Physical Activity: Sufficiently Active (08/02/2022)  Exercise Vital Sign    Days of Exercise per Week: 5 days    Minutes of Exercise per Session: 60 min  Stress: No Stress Concern Present (08/02/2022)   [employer] of Occupational Health - Occupational Stress Questionnaire    Feeling of Stress : Not at all  Social Connections: Moderately Integrated (08/02/2022)   Social Connection and Isolation Panel [NHANES]    Frequency of Communication with Friends and Family: More than three times a week    Frequency of Social Gatherings with Friends and Family: More than three times a week    Attends Religious Services: More than 4 times per year    Active Member of [employer] or Organizations: No    Attends Banker Meetings: Never    Marital Status: Married  Catering manager Violence: Not At Risk (08/02/2022)   Humiliation, Afraid, Rape, and Kick questionnaire    Fear of Current or Ex-Partner: No    Emotionally Abused: No    Physically Abused: No    Sexually Abused: No    Review of Systems:  All systems reviewed were negative except where noted in HPI.   Physical Exam:  General:  Alert, well-developed, in NAD Head:  Normocephalic and atraumatic. Eyes:  Sclera clear, no icterus.   Conjunctiva pink. Ears:  Normal auditory acuity. Mouth:  No deformity or lesions.  Neck:  Supple; no masses. Lungs:  Clear throughout to auscultation.   No wheezes, crackles, or rhonchi.  Heart:  Regular rate and rhythm; no murmurs. Abdomen:  Soft, nondistended, nontender. No masses, hepatomegaly. No palpable masses.  Normal bowel sounds.    Rectal:  Deferred   Msk:  Symmetrical without gross deformities. Extremities:   Without edema. Neurologic:  Alert and  oriented x 4; grossly normal neurologically. Skin:  Intact without significant lesions or rashes. Psych:  Alert and cooperative. Normal mood and affect.   Venita Lick. Russella Dar  05/07/2023, 10:29 AM See Loretha Stapler, [facility], to contact our on call provider

## 2023-05-08 ENCOUNTER — Telehealth: Payer: Self-pay | Admitting: *Deleted

## 2023-05-08 NOTE — Telephone Encounter (Signed)
  Follow up Call-     05/07/2023   10:20 AM  Call back number  Post procedure Call Back phone  # [phone]  Permission to leave phone message Yes     Patient questions:  Do you have a fever, pain , or abdominal swelling? No. Pain Score  0 *  Have you tolerated food without any problems? Yes.    Have you been able to return to your normal activities? Yes.    Do you have any questions about your discharge instructions: Diet   No. Medications  No. Follow up visit  No.  Do you have questions or concerns about your Care? No.  Actions: * If pain score is 4 or above: No action needed, pain <4.

## 2023-05-09 ENCOUNTER — Encounter: Payer: Self-pay | Admitting: Neurology

## 2023-05-09 ENCOUNTER — Ambulatory Visit: Payer: PPO | Admitting: Neurology

## 2023-05-09 VITALS — BP 145/75 | HR 76 | Ht 63.0 in | Wt 123.0 lb

## 2023-05-09 DIAGNOSIS — G4700 Insomnia, unspecified: Secondary | ICD-10-CM | POA: Diagnosis not present

## 2023-05-09 MED ORDER — QUETIAPINE FUMARATE 25 MG PO TABS: ORAL_TABLET | ORAL | 0 refills | Status: DC

## 2023-05-09 NOTE — Progress Notes (Signed)
Provider:  Melvyn Novas, MD  Primary Care Physician:   Sheliah Hatch, MD [address] [place] [address]     Referring Provider: Sheliah Hatch, Md [address] [place],  [place] [address]          Chief Complaint according to patient   Patient presents with:                HISTORY OF PRESENT ILLNESS:  [name] is a 77 y.o. female patient who is here for revisit 05/09/2023 for chronic Insomnia. .  Chief concern according to patient :    Failed trazodone and remains on Seroquel, and she has been treated with this medication over a period of 2 decades now. Tizanidine .      Today 05/01/22: Follow up. Overall stable.  There are some nights she may wake up and not be able to go back to sleep but not often.  I have the pleasure of meeting today again with [name] meanwhile 77 year old Caucasian female patient followed in this practice for chronic insomnia.   She has tried trazodone and none addictive medications in the past but is back on Seroquel and ties [name] which seems to control her insomnia the best. Never was a 'good sleeper' and he father was an alcoholic and she was hypervigilant at times,feeling responsible.   Her insomnia has been attributed to an anxiety disorder.  She had reported snoring but never had apnea to be considered a cause of her sleep disorder.  She also reports some cervicalgia.  On the normal neurologic basis the patient has a gait disorder, she had a fall and fractured her pelvis, she has been doing well walking with a cane or walker.  She graduated from a walker.  Her geriatric depression score was endorsed at 2.5 points out of 15 her Epworth sleepiness score is endorsed at 1 out of 24 and her fatigue severity scale was endorsed at 15 out of 63 possible points.     She feels that on medication she is neither depressed nor excessive fatigue. [place] and is not going  out as much.  She is no longer riding since her second knee replacement in 2006. Richardson Landry, MD       MM, &-06-2021: [name] is a 77 year old female with a history of insomnia.  She returns today for follow-up.  She remains on Seroquel 75 mg at bedtime and tizanidine 4 mg at bedtime.  She reports that this continues to work well for her.  She denies any new issues.  She returns today for an evaluation.   05/02/20: [name] is a 77 year old female with a history of insomnia.  She returns today for follow-up.  She tried trazodone but did not find it beneficial.  She is back on Seroquel and tizanidine.  She reports that the combination works well for her.  She denies any new issues.  She returns today for an evaluation.   HISTORY (Copied from Dr.Nalanie Winiecki's note) [name] is a 77 y.o. year old White or Caucasian female patient seen here on 10/26/2019.  I have the pleasure of seeing [name] today, a right -handed White or Caucasian female with a possible sleep disorder.  She has a  has a past medical history of Arthritis, Cataract, Fracture (10/2016), History of hiatal hernia, Hypothyroidism, Insomnia  due to anxiety and fear, and Snoring (09/29/2014)..     Review of Systems: Out of a complete 14 system review, the patient complains of only the following symptoms, and all other reviewed systems are negative.:  Fatigue, sleepiness , snoring, fragmented sleep, Insomnia.    How likely are you to doze in the following situations: 0 = not likely, 1 = slight chance, 2 = moderate chance, 3 = high chance   Sitting and Reading? Watching Television? Sitting inactive in a public place (theater or meeting)? As a passenger in a car for an hour without a break? Lying down in the afternoon when circumstances permit? Sitting and talking to someone? Sitting quietly after lunch without alcohol? In a car, while stopped for a few minutes in traffic?   Total = 2/ 24 points   FSS endorsed at 23/  63 points.   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 Beverely Low    Social Determinants of Health   Financial Resource Strain: Low Risk  (08/02/2022)   Overall Financial Resource Strain (CARDIA)    Difficulty of Paying Living Expenses: Not hard at all  Food Insecurity: No Food Insecurity (08/02/2022)   Hunger Vital Sign    Worried About Running Out of Food in the Last Year: Never true    Ran Out of Food in the Last Year: Never true  Transportation Needs: No Transportation Needs (08/02/2022)   PRAPARE - Administrator, Civil Service (Medical): No    Lack of Transportation (Non-Medical): No  Physical Activity: Sufficiently Active (08/02/2022)   Exercise Vital Sign    Days of Exercise per Week: 5 days    Minutes of Exercise per Session: 60 min  Stress: No Stress Concern Present (08/02/2022)   [employer] of Occupational Health - Occupational Stress Questionnaire    Feeling of Stress : Not at all  Social Connections: Moderately Integrated (08/02/2022)   Social Connection and Isolation Panel [NHANES]    Frequency of Communication with Friends and Family: More than three times a week    Frequency of Social Gatherings with  Friends and Family: More than three times a week    Attends Religious Services: More than 4 times per year    Active Member of [employer] or Organizations: No    Attends Banker Meetings: Never    Marital Status: Married    Family History  Problem Relation Age of Onset   Ovarian cancer Mother 73   Alcohol abuse Father    Cirrhosis Father    Stroke  Sister 56   Diabetes Paternal Grandmother    Heart disease Neg Hx    Hypertension Neg Hx    Hyperlipidemia Neg Hx    Colon cancer Neg Hx    Esophageal cancer Neg Hx     Past Medical History:  Diagnosis Date   Arthritis    Dr Corliss Skains   Cataract    Fracture 10/2016   left ankel   History of hiatal hernia    found on endoscopy   Hypothyroidism    Infected laceration 05/30/2022   Insomnia due to anxiety and fear    Snoring 09/29/2014    Past Surgical History:  Procedure Laterality Date   ABDOMINAL HYSTERECTOMY  1987   [facility], APPENDECTOMY   APPENDECTOMY  1987   APPENDECTOMY AT [facility],BSO   CATARACT EXTRACTION, BILATERAL Bilateral    20 plus years ago   COLONOSCOPY      X 3; [facility]. All negative   KNEE ARTHROSCOPY Bilateral    METATARSAL OSTEOTOMY WITH BUNIONECTOMY Left 10/31/2017   Procedure: Left First Metatarsal Scarf, Modified McBride and Akin Osteotomies;  Surgeon: Toni Arthurs, MD;  Location: [facility];  Service: Orthopedics;  Laterality: Left;   REPLACEMENT TOTAL KNEE Bilateral    X 1 each; Dr Gwenith Spitz CUFF REPAIR Right 2010     Current Outpatient Medications on File Prior to Visit  Medication Sig Dispense Refill   Calcium Carb-Cholecalciferol (CALCIUM + D3 PO) Take 1 capsule by mouth 3 (three) times daily.     desonide (DESOWEN) 0.05 % ointment      diclofenac Sodium (VOLTAREN) 1 % GEL Apply 2 g topically 4 (four) times daily as needed (for soreness or pain).     Evening Primrose Oil CAPS Take 2 capsules by mouth daily.     GLUCOSAMINE PO Take 3,000 mg by mouth daily.      levothyroxine (SYNTHROID) 50 MCG tablet TAKE 1 TABLET(50 MCG) BY MOUTH DAILY BEFORE BREAKFAST 90 tablet 1   MILK THISTLE PO Take 1 tablet by mouth 2 (two) times daily.     Multiple Vitamin (MULTIVITAMIN) capsule Take 1 capsule by mouth daily.     Omega-3 Fatty Acids (OMEGA 3 PO) Take 3,600 mg by mouth daily.     QUEtiapine (SEROQUEL) 25 MG tablet TAKE 3 TABLETS BY MOUTH AT BEDTIME 270 tablet 0   risedronate (ACTONEL) 35 MG tablet Take 35 mg by mouth once a week.     tiZANidine (ZANAFLEX) 4 MG tablet TAKE 1 TABLET(4 MG) BY MOUTH AT BEDTIME 90 tablet 3   triamcinolone cream (KENALOG) 0.1 % Apply 1 application. topically 2 (two) times daily as needed (to eczema flares).     No current facility-administered medications on file prior to visit.    Allergies  Allergen Reactions   Codeine Nausea And Vomiting   Formaldehyde Other (See Comments)    Per allergy testing, tested allergic     DIAGNOSTIC DATA (LABS, IMAGING, TESTING) - I reviewed patient records, labs, notes, testing and imaging myself where available.  Lab Results  Component Value Date   WBC 5.6 12/12/2022   HGB 14.2 12/12/2022   HCT 40.8 12/12/2022   MCV 97.5 12/12/2022   PLT 212.0 12/12/2022      Component Value Date/Time   NA 138 12/12/2022 1358   NA 139 09/23/2013 1458   K 4.2 12/12/2022 1358   CL 100 12/12/2022 1358   CO2 25 12/12/2022 1358   GLUCOSE 98 12/12/2022 1358   BUN  19 12/12/2022 1358   BUN 25 09/23/2013 1458   CREATININE 0.66 12/12/2022 1358   CALCIUM 10.4 12/12/2022 1358   PROT 7.6 12/12/2022 1358   PROT 7.3 09/23/2013 1458   ALBUMIN 4.8 12/12/2022 1358   ALBUMIN 4.7 09/23/2013 1458   AST 13 12/12/2022 1358   ALT 18 12/12/2022 1358   ALKPHOS 63 12/12/2022 1358   BILITOT 0.6 12/12/2022 1358   GFRNONAA >60 01/14/2022 0325   GFRAA 88 09/23/2013 1458   Lab Results  Component Value Date   CHOL 223 (H) 12/12/2022   HDL 112.50 12/12/2022   LDLCALC 92 12/12/2022   TRIG 92.0 12/12/2022   CHOLHDL 2  12/12/2022   No results found for: "HGBA1C" No results found for: "VITAMINB12" Lab Results  Component Value Date   TSH 3.46 12/12/2022    PHYSICAL EXAM:  Today's Vitals   05/09/23 1305  BP: (!) 145/75  Pulse: 76  Weight: 123 lb (55.8 kg)  Height: 5\' 3"  (1.6 m)   Body mass index is 21.79 kg/m.   Wt Readings from Last 3 Encounters:  05/09/23 123 lb (55.8 kg)  05/07/23 123 lb (55.8 kg)  04/01/23 123 lb (55.8 kg)     Ht Readings from Last 3 Encounters:  05/09/23 5\' 3"  (1.6 m)  05/07/23 5\' 2"  (1.575 m)  04/01/23 5\' 2"  (1.575 m)      General: The patient is awake, alert and appears not in acute distress. The patient is well groomed. Head: Normocephalic, atraumatic.  Neck is supple.  Mallampati 2,  neck circumference:14 inches. Cardiovascular:  Regular rate and cardiac rhythm by pulse,  without distended neck veins. Respiratory: Lungs are clear to auscultation.  Skin:  Without evidence of ankle edema, or rash. Trunk: The patient's posture is erect.   NEUROLOGIC EXAM: The patient is awake and alert, oriented to place and time.   Memory subjective described as intact.  Attention span & concentration ability appears normal.  Speech is fluent,  without dysarthria, dysphonia or aphasia.  Mood and affect are appropriate.   Cranial nerves: no loss of smell or taste reported  Pupils are equal and briskly reactive to light.   Extraocular movements in vertical and horizontal planes were intact and without nystagmus. No Diplopia. Visual fields by finger perimetry are intact. Hearing was intact to soft voice and finger rubbing.   Facial sensation intact to fine touch. Facial motor strength is symmetric and tongue and uvula move midline.  Neck ROM : rotation, tilt and flexion extension were normal for age and shoulder shrug was symmetrical.    Motor exam:  Symmetric bulk, tone and ROM.   Normal tone without cog-wheeling, symmetric grip strength .   Sensory:  Fine touch,  pinprick and vibration were tested  and  normal.  Proprioception tested in the upper extremities was normal.   Coordination: Rapid alternating movements in the fingers/hands were of normal speed.  The Finger-to-nose maneuver was intact without evidence of ataxia, dysmetria and very mild low amplitude  tremor.   Gait and station: Patient could rise unassisted from a seated position, walked without assistive device.  Stance is of normal width/ base .  Toe and heel walk were deferred.  Deep tendon reflexes: in the upper and lower extremities are symmetric and intact.  Babinski response was deferred.    ASSESSMENT AND PLAN 77 y.o. year old female  here with:    1) chronic insomnia on Seroquel and tizanidine, well tolerated.  Refilled.    I plan  to follow up either personally or through our NP within 12 months.   I would like to thank Sheliah Hatch, MD and Sheliah Hatch, Md [address] [place],  [place] [address] for allowing me to meet with and to take care of this pleasant patient.    After spending a total time of  28 minutes face to face and additional time for physical and neurologic examination, review of laboratory studies,  personal review of imaging studies, reports and results of other testing and review of referral information / records as far as provided in visit,   Electronically signed by: Melvyn Novas, MD 05/09/2023 1:13 PM  [facility] Board certified by The [employer] of Sleep Medicine and Diplomate of the [employer] of Sleep Medicine. Board certified In Neurology through the ABPN, Fellow of the [employer] of Neurology.

## 2023-05-09 NOTE — Patient Instructions (Signed)
Quetiapine Tablets What is this medication? QUETIAPINE (kwe TYE a peen) treats schizophrenia and bipolar disorder. It works by balancing the levels of dopamine and serotonin in your brain, hormones that help regulate mood, behaviors, and thoughts. It belongs to a group of medications called antipsychotics. Antipsychotic medications can be used to treat several kinds of mental health conditions. This medicine may be used for other purposes; ask your health care provider or pharmacist if you have questions. COMMON BRAND NAME(S): Seroquel What should I tell my care team before I take this medication? They need to know if you have any of these conditions: Blockage in your bowels Cataracts Constipation Dementia Diabetes Difficulty swallowing Glaucoma Heart disease High levels of prolactin History of breast cancer History of irregular heartbeat Liver disease Low blood cell levels (white cells, red cells, and platelets) Low blood pressure Parkinson disease Prostate disease Seizures Suicidal thoughts, plans, or attempt by you or a family member Thyroid disease Trouble passing urine An unusual or allergic reaction to quetiapine, other medications, foods, dyes, or preservatives Pregnant or trying to get pregnant Breastfeeding How should I use this medication? Take this medication by mouth with water. Take it as directed on the prescription label at the same time every day. You can take it with or without food. If it upsets your stomach, take it with food. Keep taking it unless your care team tells you to stop. A special MedGuide will be given to you by the pharmacist with each prescription and refill. Be sure to read this information carefully each time. Talk to your care team about the use of this medication in children. While this medication may be prescribed for children as young as 10 years for selected conditions, precautions do apply. People over 19 years of age may have a stronger  reaction to this medication and need smaller doses. Overdosage: If you think you have taken too much of this medicine contact a poison control center or emergency room at once. NOTE: This medicine is only for you. Do not share this medicine with others. What if I miss a dose? If you miss a dose, take it as soon as you can. If it is almost time for your next dose, take only that dose. Do not take double or extra doses. What may interact with this medication? Do not take this medication with any of the following: Cisapride Dronedarone Metoclopramide Pimozide Thioridazine This medication may also interact with the following: Alcohol Antihistamines for allergy, cough, and cold Atropine Avasimibe Certain antivirals for HIV or hepatitis Certain medications for anxiety or sleep Certain medications for bladder problems, such as oxybutynin, tolterodine Certain medications for depression, such as amitriptyline, fluoxetine, nefazodone, sertraline Certain medications for fungal infections, such as fluconazole, ketoconazole, itraconazole, posaconazole Certain medications for stomach problems, such as dicyclomine, hyoscyamine Certain medications for travel sickness, such as scopolamine Cimetidine General anesthetics, such as halothane, isoflurane, methoxyflurane, propofol Ipratropium Levodopa or other medications for Parkinson disease Medications for blood pressure Medications for seizures Medications that relax muscles for surgery Opioid medications for pain Other medications that cause heart rhythm changes Phenothiazines, such as chlorpromazine, prochlorperazine Rifampin St. John's wort This list may not describe all possible interactions. Give your health care provider a list of all the medicines, herbs, non-prescription drugs, or dietary supplements you use. Also tell them if you smoke, drink alcohol, or use illegal drugs. Some items may interact with your medicine. What should I watch for  while using this medication? Visit your care team for regular  checks on your progress. Tell your care team if your symptoms do not start to get better or if they get worse. Do not suddenly stop taking This medication. You may develop a severe reaction. Your care team will tell you how much medication to take. If your care team wants you to stop the medication, the dose may be slowly lowered over time to avoid any side effects. You may need to have an eye exam before and during use of this medication. This medication may increase blood sugar. Ask your care team if changes in diet or medications are needed if you have diabetes. This medication may cause thoughts of suicide or depression. This includes sudden changes in mood, behaviors, or thoughts. These changes can happen at any time but are more common in the beginning of treatment or after a change in dose. Call your care team right away if you experience these thoughts or worsening depression. This medication may affect your coordination, reaction time, or judgment. Do not drive or operate machinery until you know how this medication affects you. Sit up or stand slowly to reduce the risk of dizzy or fainting spells. Drinking alcohol with this medication can increase the risk of these side effects. This medication can cause problems with controlling your body temperature. It can lower the response of your body to cold temperatures. If possible, stay indoors during cold weather. If you must go outdoors, wear warm clothes. It can also lower the response of your body to heat. Do not overheat. Do not over-exercise. Stay out of the sun when possible. If you must be in the sun, wear cool clothing. Drink plenty of water. If you have trouble controlling your body temperature, call your care team right away. What side effects may I notice from receiving this medication? Side effects that you should report to your care team as soon as possible: Allergic  reactions--skin rash, itching, hives, swelling of the face, lips, tongue, or throat Heart rhythm changes--fast or irregular heartbeat, dizziness, feeling faint or lightheaded, chest pain, trouble breathing High blood sugar (hyperglycemia)--increased thirst or amount of urine, unusual weakness or fatigue, blurry vision High fever, stiff muscles, increased sweating, fast or irregular heartbeat, and confusion, which may be signs of neuroleptic malignant syndrome High prolactin level--unexpected breast tissue growth, discharge from the nipple, change in sex drive or performance, irregular menstrual cycle Increase in blood pressure in children Infection--fever, chills, cough, or sore throat Low blood pressure--dizziness, feeling faint or lightheaded, blurry vision Low thyroid levels (hypothyroidism)--unusual weakness or fatigue, increased sensitivity to cold, constipation, hair loss, dry skin, weight gain, feelings of depression Pain or trouble swallowing Seizures Stroke--sudden numbness or weakness of the face, arm, or leg, trouble speaking, confusion, trouble walking, loss of balance or coordination, dizziness, severe headache, change in vision Sudden eye pain or change in vision such as blurry vision, seeing halos around lights, vision loss Thoughts of suicide or self-harm, worsening mood, feelings of depression Trouble passing urine Uncontrolled and repetitive body movements, muscle stiffness or spasms, tremors or shaking, loss of balance or coordination, restlessness, shuffling walk, which may be signs of extrapyramidal symptoms (EPS) Side effects that usually do not require medical attention (report to your care team if they continue or are bothersome): Constipation Dizziness Drowsiness Dry mouth Weight gain This list may not describe all possible side effects. Call your doctor for medical advice about side effects. You may report side effects to FDA at 1-800-FDA-1088. Where should I keep my  medication? Keep out  of the reach of children. Store at room temperature between 15 and 30 degrees C (59 and 86 degrees F). Throw away any unused medication after the expiration date. NOTE: This sheet is a summary. It may not cover all possible information. If you have questions about this medicine, talk to your doctor, pharmacist, or health care provider.  2024 Elsevier/Gold Standard (2022-04-23 00:00:00) Insomnia Insomnia is a sleep disorder that makes it difficult to fall asleep or stay asleep. Insomnia can cause fatigue, low energy, difficulty concentrating, mood swings, and poor performance at work or school. There are three different ways to classify insomnia: Difficulty falling asleep. Difficulty staying asleep. Waking up too early in the morning. Any type of insomnia can be long-term (chronic) or short-term (acute). Both are common. Short-term insomnia usually lasts for 3 months or less. Chronic insomnia occurs at least three times a week for longer than 3 months. What are the causes? Insomnia may be caused by another condition, situation, or substance, such as: Having certain mental health conditions, such as anxiety and depression. Using caffeine, alcohol, tobacco, or drugs. Having gastrointestinal conditions, such as gastroesophageal reflux disease (GERD). Having certain medical conditions. These include: Asthma. Alzheimer's disease. Stroke. Chronic pain. An overactive thyroid gland (hyperthyroidism). Other sleep disorders, such as restless legs syndrome and sleep apnea. Menopause. Sometimes, the cause of insomnia may not be known. What increases the risk? Risk factors for insomnia include: Gender. Females are affected more often than males. Age. Insomnia is more common as people get older. Stress and certain medical and mental health conditions. Lack of exercise. Having an irregular work schedule. This may include working night shifts and traveling between different time  zones. What are the signs or symptoms? If you have insomnia, the main symptom is having trouble falling asleep or having trouble staying asleep. This may lead to other symptoms, such as: Feeling tired or having low energy. Feeling nervous about going to sleep. Not feeling rested in the morning. Having trouble concentrating. Feeling irritable, anxious, or depressed. How is this diagnosed? This condition may be diagnosed based on: Your symptoms and medical history. Your health care provider may ask about: Your sleep habits. Any medical conditions you have. Your mental health. A physical exam. How is this treated? Treatment for insomnia depends on the cause. Treatment may focus on treating an underlying condition that is causing the insomnia. Treatment may also include: Medicines to help you sleep. Counseling or therapy. Lifestyle adjustments to help you sleep better. Follow these instructions at home: Eating and drinking  Limit or avoid alcohol, caffeinated beverages, and products that contain nicotine and tobacco, especially close to bedtime. These can disrupt your sleep. Do not eat a large meal or eat spicy foods right before bedtime. This can lead to digestive discomfort that can make it hard for you to sleep. Sleep habits  Keep a sleep diary to help you and your health care provider figure out what could be causing your insomnia. Write down: When you sleep. When you wake up during the night. How well you sleep and how rested you feel the next day. Any side effects of medicines you are taking. What you eat and drink. Make your bedroom a dark, comfortable place where it is easy to fall asleep. Put up shades or blackout curtains to block light from outside. Use a white noise machine to block noise. Keep the temperature cool. Limit screen use before bedtime. This includes: Not watching TV. Not using your smartphone, tablet, or computer. Stick  to a routine that includes going to  bed and waking up at the same times every day and night. This can help you fall asleep faster. Consider making a quiet activity, such as reading, part of your nighttime routine. Try to avoid taking naps during the day so that you sleep better at night. Get out of bed if you are still awake after 15 minutes of trying to sleep. Keep the lights down, but try reading or doing a quiet activity. When you feel sleepy, go back to bed. General instructions Take over-the-counter and prescription medicines only as told by your health care provider. Exercise regularly as told by your health care provider. However, avoid exercising in the hours right before bedtime. Use relaxation techniques to manage stress. Ask your health care provider to suggest some techniques that may work well for you. These may include: Breathing exercises. Routines to release muscle tension. Visualizing peaceful scenes. Make sure that you drive carefully. Do not drive if you feel very sleepy. Keep all follow-up visits. This is important. Contact a health care provider if: You are tired throughout the day. You have trouble in your daily routine due to sleepiness. You continue to have sleep problems, or your sleep problems get worse. Get help right away if: You have thoughts about hurting yourself or someone else. Get help right away if you feel like you may hurt yourself or others, or have thoughts about taking your own life. Go to your nearest emergency room or: Call 911. Call the National Suicide Prevention Lifeline at 254 816 8463 or 988. This is open 24 hours a day. Text the Crisis Text Line at 220 602 0551. Summary Insomnia is a sleep disorder that makes it difficult to fall asleep or stay asleep. Insomnia can be long-term (chronic) or short-term (acute). Treatment for insomnia depends on the cause. Treatment may focus on treating an underlying condition that is causing the insomnia. Keep a sleep diary to help you and your  health care provider figure out what could be causing your insomnia. This information is not intended to replace advice given to you by your health care provider. Make sure you discuss any questions you have with your health care provider. Document Revised: 09/18/2021 Document Reviewed: 09/18/2021 Elsevier Patient Education  2024 [employer].

## 2023-05-20 ENCOUNTER — Encounter: Payer: Self-pay | Admitting: Gastroenterology

## 2023-06-14 DIAGNOSIS — R29890 Loss of height: Secondary | ICD-10-CM | POA: Diagnosis not present

## 2023-06-14 DIAGNOSIS — M8588 Other specified disorders of bone density and structure, other site: Secondary | ICD-10-CM | POA: Diagnosis not present

## 2023-06-14 LAB — HM DEXA SCAN

## 2023-06-20 ENCOUNTER — Ambulatory Visit (INDEPENDENT_AMBULATORY_CARE_PROVIDER_SITE_OTHER): Payer: PPO | Admitting: *Deleted

## 2023-06-20 DIAGNOSIS — Z0000 Encounter for general adult medical examination without abnormal findings: Secondary | ICD-10-CM | POA: Diagnosis not present

## 2023-06-20 NOTE — Patient Instructions (Signed)
[name] , Thank you for taking time to come for your Medicare Wellness Visit. I appreciate your ongoing commitment to your health goals. Please review the following plan we discussed and let me know if I can assist you in the future.   Screening recommendations/referrals: Colonoscopy: up to date Mammogram: up to date Bone Density: up to date Recommended yearly ophthalmology/optometry visit for glaucoma screening and checkup Recommended yearly dental visit for hygiene and checkup  Vaccinations: Influenza vaccine: Education provided Pneumococcal vaccine: up to date Tdap vaccine: up to date Shingles vaccine: up to date    Advanced directives: yes not on file     Preventive Care 65 Years and Older, Female Preventive care refers to lifestyle choices and visits with your health care provider that can promote health and wellness. What does preventive care include? A yearly physical exam. This is also called an annual well check. Dental exams once or twice a year. Routine eye exams. Ask your health care provider how often you should have your eyes checked. Personal lifestyle choices, including: Daily care of your teeth and gums. Regular physical activity. Eating a healthy diet. Avoiding tobacco and drug use. Limiting alcohol use. Practicing safe sex. Taking low-dose aspirin every day. Taking vitamin and mineral supplements as recommended by your health care provider. What happens during an annual well check? The services and screenings done by your health care provider during your annual well check will depend on your age, overall health, lifestyle risk factors, and family history of disease. Counseling  Your health care provider may ask you questions about your: Alcohol use. Tobacco use. Drug use. Emotional well-being. Home and relationship well-being. Sexual activity. Eating habits. History of falls. Memory and ability to understand (cognition). Work and work  Astronomer. Reproductive health. Screening  You may have the following tests or measurements: Height, weight, and BMI. Blood pressure. Lipid and cholesterol levels. These may be checked every 5 years, or more frequently if you are over 81 years old. Skin check. Lung cancer screening. You may have this screening every year starting at age 73 if you have a 30-pack-year history of smoking and currently smoke or have quit within the past 15 years. Fecal occult blood test (FOBT) of the stool. You may have this test every year starting at age 45. Flexible sigmoidoscopy or colonoscopy. You may have a sigmoidoscopy every 5 years or a colonoscopy every 10 years starting at age 90. Hepatitis C blood test. Hepatitis B blood test. Sexually transmitted disease (STD) testing. Diabetes screening. This is done by checking your blood sugar (glucose) after you have not eaten for a while (fasting). You may have this done every 1-3 years. Bone density scan. This is done to screen for osteoporosis. You may have this done starting at age 92. Mammogram. This may be done every 1-2 years. Talk to your health care provider about how often you should have regular mammograms. Talk with your health care provider about your test results, treatment options, and if necessary, the need for more tests. Vaccines  Your health care provider may recommend certain vaccines, such as: Influenza vaccine. This is recommended every year. Tetanus, diphtheria, and acellular pertussis (Tdap, Td) vaccine. You may need a Td booster every 10 years. Zoster vaccine. You may need this after age 65. Pneumococcal 13-valent conjugate (PCV13) vaccine. One dose is recommended after age 20. Pneumococcal polysaccharide (PPSV23) vaccine. One dose is recommended after age 58. Talk to your health care provider about which screenings and vaccines you need  and how often you need them. This information is not intended to replace advice given to you by  your health care provider. Make sure you discuss any questions you have with your health care provider. Document Released: 11/04/2015 Document Revised: 06/27/2016 Document Reviewed: 08/09/2015 Elsevier Interactive Patient Education  2017 [employer].  Fall Prevention in the Home Falls can cause injuries. They can happen to people of all ages. There are many things you can do to make your home safe and to help prevent falls. What can I do on the outside of my home? Regularly fix the edges of walkways and driveways and fix any cracks. Remove anything that might make you trip as you walk through a door, such as a raised step or threshold. Trim any bushes or trees on the path to your home. Use bright outdoor lighting. Clear any walking paths of anything that might make someone trip, such as rocks or tools. Regularly check to see if handrails are loose or broken. Make sure that both sides of any steps have handrails. Any raised decks and porches should have guardrails on the edges. Have any leaves, snow, or ice cleared regularly. Use sand or salt on walking paths during winter. Clean up any spills in your garage right away. This includes oil or grease spills. What can I do in the bathroom? Use night lights. Install grab bars by the toilet and in the tub and shower. Do not use towel bars as grab bars. Use non-skid mats or decals in the tub or shower. If you need to sit down in the shower, use a plastic, non-slip stool. Keep the floor dry. Clean up any water that spills on the floor as soon as it happens. Remove soap buildup in the tub or shower regularly. Attach bath mats securely with double-sided non-slip rug tape. Do not have throw rugs and other things on the floor that can make you trip. What can I do in the bedroom? Use night lights. Make sure that you have a light by your bed that is easy to reach. Do not use any sheets or blankets that are too big for your bed. They should not hang  down onto the floor. Have a firm chair that has side arms. You can use this for support while you get dressed. Do not have throw rugs and other things on the floor that can make you trip. What can I do in the kitchen? Clean up any spills right away. Avoid walking on wet floors. Keep items that you use a lot in easy-to-reach places. If you need to reach something above you, use a strong step stool that has a grab bar. Keep electrical cords out of the way. Do not use floor polish or wax that makes floors slippery. If you must use wax, use non-skid floor wax. Do not have throw rugs and other things on the floor that can make you trip. What can I do with my stairs? Do not leave any items on the stairs. Make sure that there are handrails on both sides of the stairs and use them. Fix handrails that are broken or loose. Make sure that handrails are as long as the stairways. Check any carpeting to make sure that it is firmly attached to the stairs. Fix any carpet that is loose or worn. Avoid having throw rugs at the top or bottom of the stairs. If you do have throw rugs, attach them to the floor with carpet tape. Make sure that you  have a light switch at the top of the stairs and the bottom of the stairs. If you do not have them, ask someone to add them for you. What else can I do to help prevent falls? Wear shoes that: Do not have high heels. Have rubber bottoms. Are comfortable and fit you well. Are closed at the toe. Do not wear sandals. If you use a stepladder: Make sure that it is fully opened. Do not climb a closed stepladder. Make sure that both sides of the stepladder are locked into place. Ask someone to hold it for you, if possible. Clearly mark and make sure that you can see: Any grab bars or handrails. First and last steps. Where the edge of each step is. Use tools that help you move around (mobility aids) if they are needed. These  include: Canes. Walkers. Scooters. Crutches. Turn on the lights when you go into a dark area. Replace any light bulbs as soon as they burn out. Set up your furniture so you have a clear path. Avoid moving your furniture around. If any of your floors are uneven, fix them. If there are any pets around you, be aware of where they are. Review your medicines with your doctor. Some medicines can make you feel dizzy. This can increase your chance of falling. Ask your doctor what other things that you can do to help prevent falls. This information is not intended to replace advice given to you by your health care provider. Make sure you discuss any questions you have with your health care provider. Document Released: 08/04/2009 Document Revised: 03/15/2016 Document Reviewed: 11/12/2014 Elsevier Interactive Patient Education  2017 [employer].

## 2023-06-20 NOTE — Progress Notes (Signed)
Subjective:   [name] is a 77 y.o. female who presents for Medicare Annual (Subsequent) preventive examination.  Visit Complete: Virtual  I connected with  [name] on 06/20/23 by a audio enabled telemedicine application and verified that I am speaking with the correct person using two identifiers.  Patient Location: Home  Provider Location: Home Office  I discussed the limitations of evaluation and management by telemedicine. The patient expressed understanding and agreed to proceed.  Vital Signs: Unable to obtain new vitals due to this being a telehealth visit.   Review of Systems     Cardiac Risk Factors include: advanced age (>79men, >42 women)     Objective:    There were no vitals filed for this visit. There is no height or weight on file to calculate BMI.     06/20/2023   11:17 AM 08/02/2022    2:42 PM 01/13/2022    4:15 PM 01/13/2022    8:31 AM 10/14/2019   12:49 PM 10/31/2017   10:02 AM 07/17/2017    3:16 PM  Advanced Directives  Does Patient Have a Medical Advance Directive? Yes Yes Yes No Yes Yes Yes  Type of Estate agent of [employer] Power of [place];Living will Out of facility DNR (pink MOST or yellow form);Living will;Healthcare Power of Attorney  Living will;Healthcare Power of [employer] Power of [place];Living will Healthcare Power of [place];Living will  Does patient want to make changes to medical advance directive?   No - Patient declined  No - Patient declined    Copy of Healthcare Power of Attorney in Chart? No - copy requested No - copy requested No - copy requested  No - copy requested Yes No - copy requested    Current Medications (verified) Outpatient Encounter Medications as of 06/20/2023  Medication Sig   Calcium Carb-Cholecalciferol (CALCIUM + D3 PO) Take 1 capsule by mouth 3 (three) times daily.   desonide (DESOWEN) 0.05 % ointment    diclofenac Sodium (VOLTAREN) 1 % GEL Apply 2 g  topically 4 (four) times daily as needed (for soreness or pain).   Evening Primrose Oil CAPS Take 2 capsules by mouth daily.   GLUCOSAMINE PO Take 3,000 mg by mouth daily.   levothyroxine (SYNTHROID) 50 MCG tablet TAKE 1 TABLET(50 MCG) BY MOUTH DAILY BEFORE BREAKFAST   MILK THISTLE PO Take 1 tablet by mouth 2 (two) times daily.   Multiple Vitamin (MULTIVITAMIN) capsule Take 1 capsule by mouth daily.   Omega-3 Fatty Acids (OMEGA 3 PO) Take 3,600 mg by mouth daily.   QUEtiapine (SEROQUEL) 25 MG tablet TAKE 3 TABLETS BY MOUTH AT BEDTIME   risedronate (ACTONEL) 35 MG tablet Take 35 mg by mouth once a week.   tiZANidine (ZANAFLEX) 4 MG tablet TAKE 1 TABLET(4 MG) BY MOUTH AT BEDTIME   triamcinolone cream (KENALOG) 0.1 % Apply 1 application. topically 2 (two) times daily as needed (to eczema flares).   No facility-administered encounter medications on file as of 06/20/2023.    Allergies (verified) Codeine and Formaldehyde   History: Past Medical History:  Diagnosis Date   Arthritis    Dr Corliss Skains   Cataract    Fracture 10/2016   left ankel   History of hiatal hernia    found on endoscopy   Hypothyroidism    Infected laceration 05/30/2022   Insomnia due to anxiety and fear    Snoring 09/29/2014   Past Surgical History:  Procedure Laterality Date   ABDOMINAL HYSTERECTOMY  1987   TAH,BSO, APPENDECTOMY   APPENDECTOMY  1987   APPENDECTOMY AT TAH,BSO   CATARACT EXTRACTION, BILATERAL Bilateral    20 plus years ago   COLONOSCOPY      X 3; [facility]. All negative   KNEE ARTHROSCOPY Bilateral    METATARSAL OSTEOTOMY WITH BUNIONECTOMY Left 10/31/2017   Procedure: Left First Metatarsal Scarf, Modified McBride and Akin Osteotomies;  Surgeon: Toni Arthurs, MD;  Location: [facility];  Service: Orthopedics;  Laterality: Left;   REPLACEMENT TOTAL KNEE Bilateral    X 1 each; Dr Gwenith Spitz CUFF REPAIR Right 2010   Family History  Problem Relation Age of Onset   Ovarian  cancer Mother 60   Alcohol abuse Father    Cirrhosis Father    Stroke Sister 28   Diabetes Paternal Grandmother    Heart disease Neg Hx    Hypertension Neg Hx    Hyperlipidemia Neg Hx    Colon cancer Neg Hx    Esophageal cancer Neg Hx    Social History   Socioeconomic History   Marital status: Married    Spouse name: Not on file   Number of children: 0   Years of education: Not on file   Highest education level: Not on file  Occupational History   Occupation: retired    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 Beverely Low    Social Determinants of Health   Financial Resource Strain: Low Risk  (06/20/2023)   Overall Financial Resource Strain (CARDIA)    Difficulty of Paying Living Expenses: Not hard at all  Food Insecurity: No Food Insecurity (06/20/2023)   Hunger Vital Sign    Worried About Running Out of Food in the Last Year: Never true    Ran Out of Food in the Last Year: Never true  Transportation Needs: No Transportation Needs (06/20/2023)   PRAPARE - Administrator, Civil Service (Medical): No    Lack of Transportation (Non-Medical): No  Physical Activity: Sufficiently Active (06/20/2023)   Exercise Vital Sign    Days of Exercise per Week: 4 days    Minutes of Exercise per Session: 60 min  Stress: No Stress Concern Present (06/20/2023)   [employer] of Occupational Health - Occupational Stress Questionnaire    Feeling of Stress : Not at all  Social  Connections: Socially Integrated (06/20/2023)   Social Connection and Isolation Panel [NHANES]    Frequency of Communication with Friends and Family: More than three times a week    Frequency of Social Gatherings with Friends and Family: More than three times a week    Attends Religious Services: 1 to 4 times per year    Active Member of [employer] or Organizations: Yes    Attends Banker Meetings: More than 4 times per year  Marital Status: Married    Tobacco Counseling Counseling given: Not Answered   Clinical Intake:  Pre-visit preparation completed: Yes  Pain : No/denies pain     Diabetes: No  How often do you need to have someone help you when you read instructions, pamphlets, or other written materials from your doctor or pharmacy?: 1 - Never  Interpreter Needed?: No  Information entered by :: Remi Haggard LPN   Activities of Daily Living    06/20/2023   11:18 AM 12/12/2022    1:19 PM  In your present state of health, do you have any difficulty performing the following activities:  Hearing? 0 0  Vision? 0 0  Difficulty concentrating or making decisions? 0 0  Walking or climbing stairs? 0 0  Dressing or bathing? 0 0  Doing errands, shopping? 0 0  Preparing Food and eating ? N   Using the Toilet? N   In the past six months, have you accidently leaked urine? N   Do you have problems with loss of bowel control? N   Managing your Medications? N   Managing your Finances? N   Housekeeping or managing your Housekeeping? N     Patient Care Team: Sheliah Hatch, MD as PCP - General (Family Medicine) Nilda Riggs, NP as Nurse Practitioner (Family Medicine) Dohmeier, Porfirio Mylar, MD as Consulting Physician (Neurology) Valarie Cones, Ihor Austin, MD as Referring Physician (Endocrinology) Macky Lower, MD as Referring Physician (Orthopedic Surgery) Aris Lot, MD as Consulting Physician (Dermatology) [place], Heather, OD as Consulting Physician  (Optometry) Beryle Flock, PT as Physical Therapist (Physical Therapy) Erroll Luna, RPH (Inactive) (Pharmacist) Olivia Mackie, NP as Nurse Practitioner (Gynecology)  Indicate any recent Medical Services you may have received from other than [facility] providers in the past year (date may be approximate).     Assessment:   This is a routine wellness examination for [employer].  Hearing/Vision screen Hearing Screening - Comments:: No trouble hearing Vision Screening - Comments:: Up to date [place]  Dietary issues and exercise activities discussed:     Goals Addressed             This Visit's Progress    Patient Stated       Continue current lifestyle       Depression Screen    06/20/2023   11:19 AM 12/12/2022    1:18 PM 08/02/2022    2:41 PM 05/30/2022    1:40 PM 01/24/2022    1:58 PM 06/19/2021   11:41 AM 12/01/2020   12:36 PM  PHQ 2/9 Scores  PHQ - 2 Score 0 0 0 0 0 0 0  PHQ- 9 Score 0 0   2  0    Fall Risk    06/20/2023   11:15 AM 12/12/2022    1:18 PM 08/02/2022    2:37 PM 05/30/2022    1:41 PM 01/24/2022    1:58 PM  Fall Risk   Falls in the past year? 0 1 1 1 1   Number falls in past yr: 0 0 1 1 0  Injury with Fall? 0 1 1 1 1   Risk for fall due to :  History of fall(s) History of fall(s);Impaired balance/gait;Orthopedic patient Other (Comment) History of fall(s);Impaired balance/gait  Risk for fall due to: Comment    First fall: tripped over bedroom slippers. Second fall: missed a step.   Follow up Falls evaluation completed;Education provided;Falls prevention discussed Falls evaluation completed Education provided;Falls prevention discussed Falls evaluation completed Falls evaluation  completed    MEDICARE RISK AT HOME: Medicare Risk at Home Any stairs in or around the home?: Yes If so, are there any without handrails?: Yes Home free of loose throw rugs in walkways, pet beds, electrical cords, etc?: Yes Adequate lighting in your home to reduce risk of falls?:  Yes Life alert?: No Use of a cane, walker or w/c?: No Grab bars in the bathroom?: No Shower chair or bench in shower?: No Elevated toilet seat or a handicapped toilet?: No  TIMED UP AND GO:  Was the test performed?  No    Cognitive Function:        06/20/2023   11:20 AM 08/02/2022    2:42 PM  6CIT Screen  What Year?  0 points  What month?  0 points  What time? 0 points 0 points  Count back from 20 0 points 0 points  Months in reverse 0 points 0 points  Repeat phrase 0 points 0 points  Total Score  0 points    Immunizations Immunization History  Administered Date(s) Administered   COVID-19, mRNA, vaccine(Comirnaty)12 years and older 08/05/2022   Fluad Quad(high Dose 65+) 07/16/2021   Influenza Split 08/05/2022   Influenza, High Dose Seasonal PF 07/17/2017, 07/13/2018, 06/10/2019, 08/06/2020, 08/05/2022   Influenza,inj,Quad PF,6+ Mos 07/11/2016   Influenza-Unspecified 08/12/2014, 07/19/2015, 01/08/2022, 01/10/2022   PFIZER Comirnaty(Gray Top)Covid-19 Tri-Sucrose Vaccine 02/13/2021   PFIZER(Purple Top)SARS-COV-2 Vaccination 11/11/2019, 12/02/2019, 06/26/2020   PNEUMOCOCCAL CONJUGATE-20 04/17/2021   Pfizer Covid-19 Vaccine Bivalent Booster 29yrs & up 07/16/2021   Pneumococcal Conjugate-13 08/12/2014   Pneumococcal Polysaccharide-23 09/10/2012, 10/31/2019   Respiratory Syncytial Virus Vaccine,Recomb Aduvanted(Arexvy) 08/05/2022   Tdap 08/12/2014, 06/13/2022   Zoster Recombinant(Shingrix) 09/02/2017, 09/27/2019   Zoster, Live 01/18/2012    TDAP status: Up to date  Flu Vaccine status: Due, Education has been provided regarding the importance of this vaccine. Advised may receive this vaccine at local pharmacy or Health Dept. Aware to provide a copy of the vaccination record if obtained from local pharmacy or Health Dept. Verbalized acceptance and understanding.  Pneumococcal vaccine status: Up to date  Covid-19 vaccine status: Information provided on how to obtain  vaccines.   Qualifies for Shingles Vaccine? No   Zostavax completed Yes   Shingrix Completed?: Yes  Screening Tests Health Maintenance  Topic Date Due   COVID-19 Vaccine (7 - 2023-24 season) 09/30/2022   INFLUENZA VACCINE  05/23/2023   Medicare Annual Wellness (AWV)  06/19/2024   DTaP/Tdap/Td (3 - Td or Tdap) 06/13/2032   Pneumonia Vaccine 41+ Years old  Completed   DEXA SCAN  Completed   Hepatitis C Screening  Completed   Zoster Vaccines- Shingrix  Completed   HPV VACCINES  Aged Out   Colonoscopy  Discontinued    Health Maintenance  Health Maintenance Due  Topic Date Due   COVID-19 Vaccine (7 - 2023-24 season) 09/30/2022   INFLUENZA VACCINE  05/23/2023    Colorectal cancer screening: No longer required.   Mammogram status: Completed  . Repeat every year  Bone Density status: Completed 2024. Results reflect: Bone density results: OSTEOPENIA. Repeat every 2 years.  Lung Cancer Screening: (Low Dose CT Chest recommended if Age 68-80 years, 20 pack-year currently smoking OR have quit w/in 15years.) does not qualify.   Lung Cancer Screening Referral:   Additional Screening:  Hepatitis C Screening: does not qualify; Completed 2017  Vision Screening: Recommended annual ophthalmology exams for early detection of glaucoma and other disorders of the eye. Is the patient up to date  with their annual eye exam?  Yes  Who is the provider or what is the name of the office in which the patient attends annual eye exams? [place] If pt is not established with a provider, would they like to be referred to a provider to establish care? Yes .   Dental Screening: Recommended annual dental exams for proper oral hygiene   Community Resource Referral / Chronic Care Management: CRR required this visit?  No   CCM required this visit?  No     Plan:     I have personally reviewed and noted the following in the patient's chart:   Medical and social history Use of alcohol, tobacco or  illicit drugs  Current medications and supplements including opioid prescriptions. Patient is not currently taking opioid prescriptions. Functional ability and status Nutritional status Physical activity Advanced directives List of other physicians Hospitalizations, surgeries, and ER visits in previous 12 months Vitals Screenings to include cognitive, depression, and falls Referrals and appointments  In addition, I have reviewed and discussed with patient certain preventive protocols, quality metrics, and best practice recommendations. A written personalized care plan for preventive services as well as general preventive health recommendations were provided to patient.     Remi Haggard, LPN   1/61/0960   After Visit Summary: (MyChart) Due to this being a telephonic visit, the after visit summary with patients personalized plan was offered to patient via MyChart   Nurse Notes:

## 2023-06-25 ENCOUNTER — Other Ambulatory Visit: Payer: Self-pay | Admitting: Neurology

## 2023-06-28 ENCOUNTER — Telehealth (INDEPENDENT_AMBULATORY_CARE_PROVIDER_SITE_OTHER): Payer: PPO | Admitting: Family Medicine

## 2023-06-28 VITALS — BP 153/93 | Ht 63.0 in

## 2023-06-28 DIAGNOSIS — U071 COVID-19: Secondary | ICD-10-CM

## 2023-06-28 NOTE — Progress Notes (Signed)
Virtual Visit via Video Note  I connected with [name] on 06/28/23 at 14:51 by a video enabled telemedicine application and verified that I am speaking with the correct person using two identifiers.   Patient Location: Home Provider Location: office - [facility].    I discussed the limitations, risks, security and privacy concerns of performing an evaluation and management service by telephone and the availability of in person appointments. I also discussed with the patient that there may be a patient responsible charge related to this service. The patient expressed understanding and agreed to proceed, consent obtained  Chief Complaint  Patient presents with   Covid Positive    Pt reports tested positive 2x this morning Sx - scratchy throat,nasal congestion,cough and achy arms OTC-benadryl Pt reports B/P from home    History of Present Illness: [name] is a 77 y.o. female tested positive for covid this morning at home.   +Scratchy throat +Nasal congestion +Cough-non productive cough +Body aches-arms +headache +nausea +fatigue  -ear pain or drianage -chest pain -shortness of breath -fever/chills  Symptoms started yesterday afternoon.   Patient has took OTC Benadryl with no relief.   Denies any recent travel or been around anyone she is aware that has been sick.   Patient Active Problem List   Diagnosis Date Noted   Pain in finger of right hand 03/26/2022   Low back pain 03/20/2022   Pain in joint of right hip 02/02/2022   Normocytic anemia 01/14/2022   Hypokalemia 01/13/2022   Hypothyroid 01/20/2018   Physical exam 10/23/2017   Hypercalciuria 10/23/2017   Snoring 09/29/2014   Nonspecific elevation of levels of transaminase or lactic acid dehydrogenase (LDH) 02/09/2014   Insomnia, persistent 09/23/2013   Chest pain 06/17/2013   GERD (gastroesophageal reflux disease) 05/04/2013   Displacement of cervical intervertebral disc without myelopathy  03/12/2013   Osteopenia    Endometriosis    Past Medical History:  Diagnosis Date   Arthritis    Dr Corliss Skains   Cataract    Fracture 10/2016   left ankel   History of hiatal hernia    found on endoscopy   Hypothyroidism    Infected laceration 05/30/2022   Insomnia due to anxiety and fear    Snoring 09/29/2014   Past Surgical History:  Procedure Laterality Date   ABDOMINAL HYSTERECTOMY  1987   TAH,BSO, APPENDECTOMY   APPENDECTOMY  1987   APPENDECTOMY AT [facility],BSO   CATARACT EXTRACTION, BILATERAL Bilateral    20 plus years ago   COLONOSCOPY      X 3; [facility]. All negative   KNEE ARTHROSCOPY Bilateral    METATARSAL OSTEOTOMY WITH BUNIONECTOMY Left 10/31/2017   Procedure: Left First Metatarsal Scarf, Modified McBride and Akin Osteotomies;  Surgeon: Toni Arthurs, MD;  Location: [facility];  Service: Orthopedics;  Laterality: Left;   REPLACEMENT TOTAL KNEE Bilateral    X 1 each; Dr Gwenith Spitz CUFF REPAIR Right 2010   Allergies  Allergen Reactions   Codeine Nausea And Vomiting   Formaldehyde Other (See Comments)    Per allergy testing, tested allergic   Prior to Admission medications   Medication Sig Start Date End Date Taking? Authorizing Provider  Calcium Carb-Cholecalciferol (CALCIUM + D3 PO) Take 1 capsule by mouth 3 (three) times daily.   Yes [provider]  desonide (DESOWEN) 0.05 % ointment    Yes [provider]  diclofenac Sodium (VOLTAREN) 1 % GEL Apply 2 g topically 4 (  four) times daily as needed (for soreness or pain).   Yes [provider]  Evening Primrose Oil CAPS Take 2 capsules by mouth daily.   Yes [provider]  GLUCOSAMINE PO Take 3,000 mg by mouth daily.   Yes [provider]  levothyroxine (SYNTHROID) 50 MCG tablet TAKE 1 TABLET(50 MCG) BY MOUTH DAILY BEFORE BREAKFAST 04/19/23  Yes Sheliah Hatch, MD  MILK THISTLE PO Take 1 tablet by mouth 2 (two) times daily.   Yes [provider]  Multiple Vitamin (MULTIVITAMIN) capsule Take 1 capsule by mouth daily.   Yes [provider]  Omega-3 Fatty Acids (OMEGA 3 PO) Take 3,600 mg by mouth daily.   Yes [provider]  QUEtiapine (SEROQUEL) 25 MG tablet TAKE 3 TABLETS BY MOUTH AT BEDTIME 05/09/23  Yes Dohmeier, Porfirio Mylar, MD  risedronate (ACTONEL) 35 MG tablet Take 35 mg by mouth once a week. 04/09/22  Yes [provider]  tiZANidine (ZANAFLEX) 4 MG tablet TAKE 1 TABLET(4 MG) BY MOUTH AT BEDTIME 06/25/23  Yes Dohmeier, Porfirio Mylar, MD  triamcinolone cream (KENALOG) 0.1 % Apply 1 application. topically 2 (two) times daily as needed (to eczema flares).   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 Beverely Low    Social Determinants of Health   Financial Resource Strain: Low Risk  (06/20/2023)   Overall Financial Resource Strain (CARDIA)    Difficulty of Paying Living Expenses: Not hard at all  Food Insecurity: No Food Insecurity (06/20/2023)   Hunger Vital Sign    Worried About Running Out of Food in the Last Year: Never true    Ran Out of Food in the Last Year: Never true  Transportation Needs: No Transportation Needs (06/20/2023)    PRAPARE - Administrator, Civil Service (Medical): No    Lack of Transportation (Non-Medical): No  Physical Activity: Sufficiently Active (06/20/2023)   Exercise Vital Sign    Days of Exercise per Week: 4 days    Minutes of Exercise per Session: 60 min  Stress: No Stress Concern Present (06/20/2023)   [employer] of Occupational Health - Occupational Stress Questionnaire    Feeling of Stress : Not at all  Social Connections: Socially Integrated (06/20/2023)   Social Connection and Isolation Panel [NHANES]    Frequency of Communication with Friends and Family: More than three times a week    Frequency of Social Gatherings with Friends and Family: More than three times a week    Attends Religious Services: 1  to 4 times per year    Active Member of Clubs or Organizations: Yes    Attends Banker Meetings: More than 4 times per year    Marital Status: Married  Catering manager Violence: Not At Risk (06/20/2023)   Humiliation, Afraid, Rape, and Kick questionnaire    Fear of Current or Ex-Partner: No    Emotionally Abused: No    Physically Abused: No    Sexually Abused: No    Observations/Objective: Today's Vitals   06/28/23 1328  BP: (!) 153/93  Height: 5\' 3"  (1.6 m)  Patient obtained vital signs and reported vital signs.  Physical Exam Vitals reviewed.  Constitutional:      General: She is not in acute distress.    Appearance: Normal appearance. She is not ill-appearing, toxic-appearing or diaphoretic.  HENT:     Head: Normocephalic and atraumatic.  Eyes:     General:        Right eye: No discharge.        Left eye: No discharge.     Conjunctiva/sclera: Conjunctivae normal.  Pulmonary:     Effort: Pulmonary effort is normal. No respiratory distress.  Skin:    General: Skin is dry.  Neurological:     General: No focal deficit present.     Mental Status: She is alert and oriented to person, place, and time. Mental status is at baseline.   Psychiatric:        Mood and Affect: Mood normal.        Behavior: Behavior normal.        Thought Content: Thought content normal.        Judgment: Judgment normal.    Assessment and Plan: COVID  -Patient would like to do supportive care instead of taking antiviral, Paxlovid.  -Recommend Alternate Tylenol 1000mg  and Ibuprofen 600-800mg  every 4 hours for pain, headache, and body aches. Recommend to eat something when taking Ibuprofen.   -Rest, hydrate.  -Recommend to take over the Mucinex to help with symptoms.  -Advised if she develops chest pain and/or shortness of breath, follow up with emergency department. -Recommend Vitamin C 1,000mg ; Vitamin D 1,000IU; and Zinc 50-100mg  daily for 30 days to support your immune system.  -Discussed covid quarantine guidelines.  Follow Up Instructions: If not improved.   I discussed the assessment and treatment plan with the patient. The patient was provided an opportunity to ask questions and all were answered. The patient agreed with the plan and demonstrated an understanding of the instructions.   The patient was advised to call back or seek an in-person evaluation if the symptoms worsen or if the condition fails to improve as anticipated.  Zandra Abts, NP

## 2023-07-21 ENCOUNTER — Other Ambulatory Visit: Payer: Self-pay | Admitting: Adult Health

## 2023-07-28 ENCOUNTER — Other Ambulatory Visit: Payer: Self-pay | Admitting: Neurology

## 2023-08-14 DIAGNOSIS — M810 Age-related osteoporosis without current pathological fracture: Secondary | ICD-10-CM | POA: Diagnosis not present

## 2023-08-28 ENCOUNTER — Telehealth: Payer: Self-pay | Admitting: Neurology

## 2023-08-28 NOTE — Telephone Encounter (Signed)
Returned pt's call regarding her medication Seroquel, pt stated that her question is why does every time she requests a refill for this medication does the pharmacy need to get the physician's authorization. Advised pt that this could be something new through her insurance, and also could be because of her age and this medication. I gave her the information for good RX and she could get this prescription for 3 month supply for $14. Pt was appreciative for the call, did not have any additional questions.

## 2023-08-28 NOTE — Telephone Encounter (Signed)
Pt called in saying that [facility] told her that her QUEtiapine (SEROQUEL) 25 MG tablet [[id number]] is needing provider's authorization before being picked up. Pt would like a call once this has been resolved.

## 2023-08-28 NOTE — Telephone Encounter (Signed)
  I think this must be new through her insurance requiring a PA. It is likely because of her age and this medication. I attempted a PA but it will likely be denied because doesn't meet the FDA criteria for being diagnosed.  Can get the medication using goodrx coupon.

## 2023-08-28 NOTE — Telephone Encounter (Signed)
PA completed on [facility] health team advantage. KEY: [id number] Will await determination

## 2023-08-29 NOTE — Telephone Encounter (Signed)
PA was denied as suspected, pt will continue with the goodrx

## 2023-09-02 DIAGNOSIS — D225 Melanocytic nevi of trunk: Secondary | ICD-10-CM | POA: Diagnosis not present

## 2023-09-02 DIAGNOSIS — L821 Other seborrheic keratosis: Secondary | ICD-10-CM | POA: Diagnosis not present

## 2023-09-02 DIAGNOSIS — L738 Other specified follicular disorders: Secondary | ICD-10-CM | POA: Diagnosis not present

## 2023-09-02 DIAGNOSIS — L814 Other melanin hyperpigmentation: Secondary | ICD-10-CM | POA: Diagnosis not present

## 2023-09-02 DIAGNOSIS — L570 Actinic keratosis: Secondary | ICD-10-CM | POA: Diagnosis not present

## 2023-09-04 ENCOUNTER — Other Ambulatory Visit: Payer: Self-pay | Admitting: Neurology

## 2023-09-04 ENCOUNTER — Telehealth: Payer: Self-pay | Admitting: Neurology

## 2023-09-04 MED ORDER — QUETIAPINE FUMARATE 25 MG PO TABS: ORAL_TABLET | ORAL | 0 refills | Status: DC

## 2023-09-04 NOTE — Telephone Encounter (Signed)
Pt is asking if a RN is able to notify pharmacy that this medication QUEtiapine (SEROQUEL) 25 MG tablet  is something Dr Vickey Huger allows yearly refills on so she is no longer informed no refills without authorization of MD

## 2023-09-10 NOTE — Telephone Encounter (Signed)
Error

## 2023-10-17 ENCOUNTER — Other Ambulatory Visit: Payer: Self-pay | Admitting: Family Medicine

## 2023-10-24 ENCOUNTER — Telehealth: Payer: Self-pay | Admitting: Neurology

## 2023-10-24 ENCOUNTER — Other Ambulatory Visit: Payer: Self-pay

## 2023-10-24 MED ORDER — QUETIAPINE FUMARATE 25 MG PO TABS: ORAL_TABLET | ORAL | 0 refills | Status: DC

## 2023-10-24 NOTE — Telephone Encounter (Signed)
 Pt said, tiZANidine (ZANAFLEX) 4 MG tablet and QUEtiapine (SEROQUEL) 25 MG tablet medications are not working. Having trouble sleeping at night. Would like a call back.

## 2023-10-28 ENCOUNTER — Encounter: Payer: Self-pay | Admitting: Family Medicine

## 2023-10-28 ENCOUNTER — Ambulatory Visit (INDEPENDENT_AMBULATORY_CARE_PROVIDER_SITE_OTHER): Payer: PPO | Admitting: Family Medicine

## 2023-10-28 VITALS — BP 130/70 | HR 83 | Temp 98.7°F | Ht 63.0 in | Wt 124.4 lb

## 2023-10-28 DIAGNOSIS — R052 Subacute cough: Secondary | ICD-10-CM | POA: Diagnosis not present

## 2023-10-28 MED ORDER — PREDNISONE 10 MG PO TABS: ORAL_TABLET | ORAL | 0 refills | Status: DC

## 2023-10-28 NOTE — Patient Instructions (Signed)
 Follow up as needed or as scheduled START the Prednisone  as directed- 3 pills at the same time x3 days, then 2 pills at the same time x3 days, then 1 pill daily.  Take w/ food  Drink LOTS of fluids REST! Robitussin or Delsym as needed Call with any questions or concerns Stay Safe!  Stay Healthy! Hang in there!!!

## 2023-10-28 NOTE — Telephone Encounter (Signed)
 Called the patient back and she has been having difficulty sleeping. She states that the reason is because of her husbands healthcare decline with memory. He has an apt here but not til march and this has really worried her. I was able to get the patient worked in sooner and she felt a weight lifted off her.

## 2023-10-28 NOTE — Progress Notes (Signed)
   Subjective:    Patient ID: [name], female    DOB: [birth year], 78 y.o.   MRN: [id number]  HPI Cough- sxs started ~3 weeks ago.  + nasal congestion.  Cough is intermittently productive of clear sputum.  No fever.  No body aches or chills.  No HA.  No SOB or wheezing.     Review of Systems For ROS see HPI     Objective:   Physical Exam Vitals reviewed.  Constitutional:      General: She is not in acute distress.    Appearance: Normal appearance. She is well-developed. She is not ill-appearing.  HENT:     Head: Normocephalic and atraumatic.     Right Ear: Tympanic membrane and ear canal normal.     Left Ear: Tympanic membrane and ear canal normal.     Nose: Congestion present.  Eyes:     Conjunctiva/sclera: Conjunctivae normal.     Pupils: Pupils are equal, round, and reactive to light.  Cardiovascular:     Rate and Rhythm: Normal rate and regular rhythm.     Heart sounds: Normal heart sounds. No murmur heard. Pulmonary:     Effort: Pulmonary effort is normal. No respiratory distress.     Breath sounds: Normal breath sounds. No wheezing.     Comments: + dry cough Musculoskeletal:     Cervical back: Normal range of motion and neck supple.  Lymphadenopathy:     Cervical: No cervical adenopathy.  Neurological:     Mental Status: She is alert.           Assessment & Plan:   Cough- new.  Pt reports this has been ongoing x3 weeks.  Not currently feeling ill but feeling run down due to cough.  No evidence of bacterial infxn on exam so no abx needed.  Start Prednisone  taper for airway inflammation.  Reviewed supportive care and red flags that should prompt return.  Pt expressed understanding and is in agreement w/ plan.

## 2023-11-12 NOTE — Telephone Encounter (Addendum)
Error

## 2023-11-19 NOTE — Telephone Encounter (Signed)
Error

## 2023-11-27 DIAGNOSIS — M545 Low back pain, unspecified: Secondary | ICD-10-CM | POA: Diagnosis not present

## 2023-12-03 ENCOUNTER — Telehealth: Payer: Self-pay | Admitting: Neurology

## 2023-12-03 NOTE — Telephone Encounter (Signed)
In response to a vm that pt left at 3:50 to r/s an appointment pt was called and has been r/s

## 2023-12-09 DIAGNOSIS — M5136 Other intervertebral disc degeneration, lumbar region with discogenic back pain only: Secondary | ICD-10-CM | POA: Diagnosis not present

## 2023-12-16 ENCOUNTER — Encounter: Payer: Self-pay | Admitting: Family Medicine

## 2023-12-16 ENCOUNTER — Ambulatory Visit (INDEPENDENT_AMBULATORY_CARE_PROVIDER_SITE_OTHER): Payer: PPO | Admitting: Family Medicine

## 2023-12-16 VITALS — BP 130/78 | HR 78 | Ht 63.0 in | Wt 124.5 lb

## 2023-12-16 DIAGNOSIS — M8580 Other specified disorders of bone density and structure, unspecified site: Secondary | ICD-10-CM | POA: Diagnosis not present

## 2023-12-16 DIAGNOSIS — R310 Gross hematuria: Secondary | ICD-10-CM | POA: Diagnosis not present

## 2023-12-16 DIAGNOSIS — E039 Hypothyroidism, unspecified: Secondary | ICD-10-CM | POA: Diagnosis not present

## 2023-12-16 DIAGNOSIS — Z0000 Encounter for general adult medical examination without abnormal findings: Secondary | ICD-10-CM | POA: Diagnosis not present

## 2023-12-16 LAB — CBC WITH DIFFERENTIAL/PLATELET
Basophils Absolute: 0 10*3/uL (ref 0.0–0.1)
Basophils Relative: 0.3 % (ref 0.0–3.0)
Eosinophils Absolute: 0.1 10*3/uL (ref 0.0–0.7)
Eosinophils Relative: 2.8 % (ref 0.0–5.0)
HCT: 41.5 % (ref 36.0–46.0)
Hemoglobin: 14.1 g/dL (ref 12.0–15.0)
Lymphocytes Relative: 24.7 % (ref 12.0–46.0)
Lymphs Abs: 1.3 10*3/uL (ref 0.7–4.0)
MCHC: 34 g/dL (ref 30.0–36.0)
MCV: 99.3 fl (ref 78.0–100.0)
Monocytes Absolute: 0.5 10*3/uL (ref 0.1–1.0)
Monocytes Relative: 10.1 % (ref 3.0–12.0)
Neutro Abs: 3.2 10*3/uL (ref 1.4–7.7)
Neutrophils Relative %: 62.1 % (ref 43.0–77.0)
Platelets: 200 10*3/uL (ref 150.0–400.0)
RBC: 4.18 Mil/uL (ref 3.87–5.11)
RDW: 13.2 % (ref 11.5–15.5)
WBC: 5.2 10*3/uL (ref 4.0–10.5)

## 2023-12-16 LAB — TSH: TSH: 3.96 u[IU]/mL (ref 0.35–5.50)

## 2023-12-16 LAB — HEPATIC FUNCTION PANEL
ALT: 17 U/L (ref 0–35)
AST: 13 U/L (ref 0–37)
Albumin: 4.4 g/dL (ref 3.5–5.2)
Alkaline Phosphatase: 48 U/L (ref 39–117)
Bilirubin, Direct: 0.1 mg/dL (ref 0.0–0.3)
Total Bilirubin: 0.7 mg/dL (ref 0.2–1.2)
Total Protein: 7.2 g/dL (ref 6.0–8.3)

## 2023-12-16 LAB — BASIC METABOLIC PANEL
BUN: 16 mg/dL (ref 6–23)
CO2: 27 meq/L (ref 19–32)
Calcium: 10 mg/dL (ref 8.4–10.5)
Chloride: 103 meq/L (ref 96–112)
Creatinine, Ser: 0.62 mg/dL (ref 0.40–1.20)
GFR: 85.8 mL/min (ref >60.00)
Glucose, Bld: 105 mg/dL — ABNORMAL HIGH (ref 70–99)
Potassium: 4.6 meq/L (ref 3.5–5.1)
Sodium: 141 meq/L (ref 135–145)

## 2023-12-16 LAB — LIPID PANEL
Cholesterol: 212 mg/dL — ABNORMAL HIGH (ref 0–200)
HDL: 88.8 mg/dL (ref >39.00)
LDL Cholesterol: 99 mg/dL (ref 0–99)
NonHDL: 122.74
Total CHOL/HDL Ratio: 2
Triglycerides: 118 mg/dL (ref 0.0–149.0)
VLDL: 23.6 mg/dL (ref 0.0–40.0)

## 2023-12-16 LAB — POCT URINALYSIS DIPSTICK
Bilirubin, UA: NEGATIVE
Blood, UA: NEGATIVE
Glucose, UA: NEGATIVE
Ketones, UA: NEGATIVE
Leukocytes, UA: NEGATIVE
Nitrite, UA: NEGATIVE
Protein, UA: NEGATIVE
Spec Grav, UA: 1.02 (ref 1.010–1.025)
Urobilinogen, UA: 0.2 U/dL
pH, UA: 6 (ref 5.0–8.0)

## 2023-12-16 LAB — VITAMIN D 25 HYDROXY (VIT D DEFICIENCY, FRACTURES): VITD: 34.68 ng/mL (ref 30.00–100.00)

## 2023-12-16 MED ORDER — LEVOTHYROXINE SODIUM 50 MCG PO TABS: 50.0000 ug | ORAL_TABLET | Freq: Every day | ORAL | 1 refills | Status: DC

## 2023-12-16 NOTE — Progress Notes (Signed)
   Subjective:    Patient ID: [name], female    DOB: [birth year], 78 y.o.   MRN: [id number]  HPI CPE- UTD on Tdap, colonoscopy, PNA  Patient Care Team    Relationship Specialty Notifications Start End  Sheliah Hatch, MD PCP - General Family Medicine  01/30/16   Nilda Riggs, NP Nurse Practitioner Family Medicine  07/11/16   Dohmeier, Porfirio Mylar, MD Consulting Physician Neurology  07/17/17   Valarie Cones, Ihor Austin, MD Referring Physician Endocrinology  07/17/17   Macky Lower, MD Referring Physician Orthopedic Surgery  07/17/17   Aris Lot, MD Consulting Physician Dermatology  10/14/19   Burund[place], Heather, OD Consulting Physician Optometry  10/14/19   Beryle Flock, PT Physical Therapist Physical Therapy  06/06/20   Erroll Luna, [facility] (Inactive)  Pharmacist  04/04/22    Comment: [phone]  Olivia Mackie, NP Nurse Practitioner Gynecology  04/01/23     Health Maintenance  Topic Date Due   INFLUENZA VACCINE  05/23/2023   COVID-19 Vaccine (7 - 2024-25 season) 06/23/2023   Medicare Annual Wellness (AWV)  06/19/2024   DTaP/Tdap/Td (3 - Td or Tdap) 06/13/2032   Colonoscopy  05/06/2033   Pneumonia Vaccine 38+ Years old  Completed   DEXA SCAN  Completed   Hepatitis C Screening  Completed   Zoster Vaccines- Shingrix  Completed   HPV VACCINES  Aged Out      Review of Systems Patient reports no vision/ hearing changes, adenopathy,fever, weight change,  persistant/recurrent hoarseness , swallowing issues, chest pain, palpitations, edema, persistant/recurrent cough, hemoptysis, dyspnea (rest/exertional/paroxysmal nocturnal), abdominal pain, significant heartburn, bowel changes, Gyn symptoms (abnormal  bleeding, pain),  syncope, focal weakness, memory loss, numbness & tingling, skin/hair/nail changes, anxiety, or depression.   + BRB on toilet tissue when wiping after BM.  No pain.      Objective:   Physical Exam General Appearance:    Alert, cooperative, no  distress, appears stated age  Head:    Normocephalic, without obvious abnormality, atraumatic  Eyes:    PERRL, conjunctiva/corneas clear, EOM's intact both eyes  Ears:    Normal TM's and external ear canals, both ears  Nose:   Nares normal, septum midline, mucosa normal, no drainage    or sinus tenderness  Throat:   Lips, mucosa, and tongue normal; teeth and gums normal  Neck:   Supple, symmetrical, trachea midline, no adenopathy;    Thyroid: no enlargement/tenderness/nodules  Back:     Symmetric, no curvature, ROM normal, no CVA tenderness  Lungs:     Clear to auscultation bilaterally, respirations unlabored  Chest Wall:    No tenderness or deformity   Heart:    Regular rate and rhythm, S1 and S2 normal, no murmur, rub   or gallop  Breast Exam:    Deferred to GYN  Abdomen:     Soft, non-tender, bowel sounds active all four quadrants,    no masses, no organomegaly  Genitalia:    Deferred to GYN  Rectal:    Small external hemorrhoid  Extremities:   Extremities normal, atraumatic, no cyanosis or edema  Pulses:   2+ and symmetric all extremities  Skin:   Skin color, texture, turgor normal, no rashes or lesions  Lymph nodes:   Cervical, supraclavicular, and axillary nodes normal  Neurologic:   CNII-XII intact, normal strength, sensation and reflexes    throughout          Assessment & Plan:

## 2023-12-16 NOTE — Assessment & Plan Note (Signed)
 Pt's PE WNL w/ exception of small external hemorrhoid.  UTD on Tdap, PNA, colonoscopy.  Check labs.  Anticipatory guidance provided.

## 2023-12-16 NOTE — Assessment & Plan Note (Signed)
 Check Vit D level and replete prn.

## 2023-12-16 NOTE — Patient Instructions (Signed)
Follow up in 1 year or as needed We'll notify you of your lab results and make any changes if needed Keep up the good work on healthy diet and regular exercise- you look great!!! Call with any questions or concerns Stay Safe!  Stay Healthy! Happy Spring!!! 

## 2023-12-17 ENCOUNTER — Encounter: Payer: Self-pay | Admitting: Family Medicine

## 2023-12-17 ENCOUNTER — Telehealth: Payer: Self-pay

## 2023-12-17 NOTE — Telephone Encounter (Signed)
-----   Message from Neena Rhymes sent at 12/17/2023  7:24 AM EST ----- Labs look great!  No changes at this time.  Total cholesterol is mildly elevated but this is misleading b/c your HDL (good cholesterol) is so good.  No cause for concern.

## 2023-12-17 NOTE — Telephone Encounter (Signed)
 Pt has been notified.

## 2024-01-10 ENCOUNTER — Telehealth: Payer: Self-pay | Admitting: Family Medicine

## 2024-01-10 DIAGNOSIS — F4320 Adjustment disorder, unspecified: Secondary | ICD-10-CM

## 2024-01-10 NOTE — Telephone Encounter (Signed)
 Copied from CRM [id number]. Topic: Referral - Question >> Jan 10, 2024 12:27 PM [name] wrote:  Reason for CRM: Patient called in to ask Dr. Beverely Low for a name of a therapist that she would recommend for memory regarding her husband. Please call [phone]

## 2024-01-10 NOTE — Telephone Encounter (Signed)
 Patient is looking for recommendations for therapist that can help with memory for her husband. Please advise

## 2024-01-13 DIAGNOSIS — M5136 Other intervertebral disc degeneration, lumbar region with discogenic back pain only: Secondary | ICD-10-CM | POA: Diagnosis not present

## 2024-01-16 NOTE — Telephone Encounter (Signed)
 Pt is requesting this for herself.

## 2024-01-16 NOTE — Telephone Encounter (Signed)
 Is she asking for a therapist for her husband or for herself (to deal with the situation)?  If it's for her husband, I would ask the Neurologist who they most commonly use.  If it is for her, we can certainly make some suggestions

## 2024-01-17 NOTE — Telephone Encounter (Signed)
 We can refer her to [facility] [place] location) for Dx- Adjustment disorder (husband recently dx'd w/ Alzheimer's).  We can also provide the number for [facility] [phone]

## 2024-01-17 NOTE — Addendum Note (Signed)
 Addended by: Eldred Manges on: 01/17/2024 12:39 PM   Modules accepted: Orders

## 2024-01-17 NOTE — Telephone Encounter (Signed)
 Called patient to discuss, no answer, left a message for the patient to call back to discuss referral and provide Number to psychiatry

## 2024-01-20 NOTE — Telephone Encounter (Signed)
 Called patient to relay recent note, Patient stated someone had already called and relayed this message.

## 2024-02-03 DIAGNOSIS — F4322 Adjustment disorder with anxiety: Secondary | ICD-10-CM | POA: Diagnosis not present

## 2024-02-17 DIAGNOSIS — F4322 Adjustment disorder with anxiety: Secondary | ICD-10-CM | POA: Diagnosis not present

## 2024-03-02 DIAGNOSIS — F4322 Adjustment disorder with anxiety: Secondary | ICD-10-CM | POA: Diagnosis not present

## 2024-03-23 DIAGNOSIS — F4322 Adjustment disorder with anxiety: Secondary | ICD-10-CM | POA: Diagnosis not present

## 2024-03-30 ENCOUNTER — Encounter: Payer: Self-pay | Admitting: Family Medicine

## 2024-03-30 ENCOUNTER — Ambulatory Visit (INDEPENDENT_AMBULATORY_CARE_PROVIDER_SITE_OTHER): Admitting: Family Medicine

## 2024-03-30 VITALS — BP 130/82 | HR 92 | Temp 98.3°F | Ht 63.0 in | Wt 124.2 lb

## 2024-03-30 DIAGNOSIS — F4322 Adjustment disorder with anxiety: Secondary | ICD-10-CM | POA: Insufficient documentation

## 2024-03-30 MED ORDER — FLUOXETINE HCL 10 MG PO CAPS: 10.0000 mg | ORAL_CAPSULE | Freq: Every day | ORAL | 3 refills | Status: DC

## 2024-03-30 NOTE — Patient Instructions (Signed)
 Follow up in 4-6 weeks to recheck mood START the Fluoxetine daily Call with any questions or concerns Hang in there!!

## 2024-03-30 NOTE — Assessment & Plan Note (Signed)
 New.  Pt is understandably having a hard time w/ her husband's recent dx of Alzheimer's.  She is seeing a therapist but is struggling w/ anxiety.  The unknown and not having a roadmap to navigate the future is distressing to her.  Will start low dose Fluoxetine and monitor closely for improvement.  Pt expressed understanding and is in agreement w/ plan.

## 2024-03-30 NOTE — Progress Notes (Signed)
   Subjective:    Patient ID: Eliot Guernsey, female    DOB: [birth year], 78 y.o.   MRN: [id number]  HPI Anxiety- husband has recently been dx'd w/ Alzheimer's.  Currently seeing a therapist.  Husband is getting irritated w/ her b/c he feels she lacks patience.  Therapist mentioned starting something for anxiety.  Already takes medication for sleep- some nights sleeps well, other nights wakes up feeling panicked.     Review of Systems For ROS see HPI     Objective:   Physical Exam Vitals reviewed.  Constitutional:      General: She is not in acute distress.    Appearance: Normal appearance. She is not ill-appearing.  HENT:     Head: Normocephalic and atraumatic.  Eyes:     Extraocular Movements: Extraocular movements intact.     Conjunctiva/sclera: Conjunctivae normal.  Cardiovascular:     Rate and Rhythm: Normal rate and regular rhythm.  Pulmonary:     Effort: Pulmonary effort is normal. No respiratory distress.     Breath sounds: No wheezing or rhonchi.  Skin:    General: Skin is warm and dry.  Neurological:     General: No focal deficit present.     Mental Status: She is alert and oriented to person, place, and time.  Psychiatric:        Mood and Affect: Mood normal.        Behavior: Behavior normal.        Thought Content: Thought content normal.           Assessment & Plan:

## 2024-04-08 ENCOUNTER — Telehealth: Payer: Self-pay | Admitting: Neurology

## 2024-04-08 MED ORDER — QUETIAPINE FUMARATE 25 MG PO TABS: ORAL_TABLET | ORAL | 0 refills | Status: DC

## 2024-04-08 NOTE — Telephone Encounter (Signed)
 Pt is requesting a refill for QUEtiapine  (SEROQUEL ) 25 MG tablet (270 tablets).  Pharmacy: [facility] [phone]

## 2024-04-08 NOTE — Telephone Encounter (Signed)
Refill sent for the pt

## 2024-04-13 DIAGNOSIS — F4322 Adjustment disorder with anxiety: Secondary | ICD-10-CM | POA: Diagnosis not present

## 2024-04-15 ENCOUNTER — Other Ambulatory Visit: Payer: Self-pay | Admitting: Family Medicine

## 2024-04-16 ENCOUNTER — Encounter: Payer: Self-pay | Admitting: Family Medicine

## 2024-04-16 ENCOUNTER — Ambulatory Visit: Admitting: Family Medicine

## 2024-04-16 VITALS — BP 110/64 | HR 78 | Temp 98.8°F | Ht 63.0 in | Wt 121.6 lb

## 2024-04-16 DIAGNOSIS — F4322 Adjustment disorder with anxiety: Secondary | ICD-10-CM | POA: Diagnosis not present

## 2024-04-16 MED ORDER — FLUOXETINE HCL 20 MG PO CAPS: 20.0000 mg | ORAL_CAPSULE | Freq: Every day | ORAL | 3 refills | Status: AC

## 2024-04-16 NOTE — Patient Instructions (Addendum)
 Follow up in 6 weeks to recheck mood Increase the Fluoxetine  to 20mg  daily- 2 of what you have at home and 1 of the new prescription Call with any questions or concerns Stay Safe!  Stay Healthy! Hang in there!!!

## 2024-04-16 NOTE — Progress Notes (Signed)
   Subjective:    Patient ID: [name], female    DOB: 05-Sep-1946, 78 y.o.   MRN: [id number]  HPI Adjustment d/o- husband had Neuro appt on Monday.  Pt feels the NP had an attitude during the visit like 'i don't have time for this'.  Was dismissive of their questions.  Pt feels she doesn't have the information to help him.  She is angry based on how they were treated at the appt.  Wants to know if she was overreacting.   Review of Systems For ROS see HPI     Objective:   Physical Exam Vitals reviewed.  Constitutional:      General: She is not in acute distress.    Appearance: Normal appearance. She is not ill-appearing.  HENT:     Head: Normocephalic and atraumatic.   Eyes:     Extraocular Movements: Extraocular movements intact.     Conjunctiva/sclera: Conjunctivae normal.    Skin:    General: Skin is warm and dry.   Neurological:     General: No focal deficit present.     Mental Status: She is alert and oriented to person, place, and time.   Psychiatric:        Mood and Affect: Mood normal.        Behavior: Behavior normal.        Thought Content: Thought content normal.           Assessment & Plan:

## 2024-04-17 DIAGNOSIS — Z1231 Encounter for screening mammogram for malignant neoplasm of breast: Secondary | ICD-10-CM | POA: Diagnosis not present

## 2024-04-17 LAB — HM MAMMOGRAPHY

## 2024-04-19 NOTE — Assessment & Plan Note (Signed)
 Deteriorated.  Pt admits that she is anxious, fearful, and now angry with how things are going with neuro.  She felt the provider was dismissive, rude, and lacked empathy.  I told her that she was not overreacting and she and her husband deserve kindness, empathy, and respect as they travel this path.  Will increase Fluoxetine  to 20mg  daily and follow.  Pt expressed understanding and is in agreement w/ plan.

## 2024-04-21 ENCOUNTER — Encounter: Payer: Self-pay | Admitting: Nurse Practitioner

## 2024-04-27 ENCOUNTER — Ambulatory Visit: Admitting: Family Medicine

## 2024-05-01 ENCOUNTER — Telehealth: Payer: Self-pay | Admitting: Neurology

## 2024-05-01 NOTE — Telephone Encounter (Signed)
 Pt called wanting to speak to the Rn about her issues with her sleep medicine. She states she is having a terrible time with it and would like to be advised on what can be done.

## 2024-05-04 MED ORDER — QUETIAPINE FUMARATE 25 MG PO TABS: ORAL_TABLET | ORAL | 0 refills | Status: DC

## 2024-05-04 MED ORDER — QUETIAPINE FUMARATE 100 MG PO TABS: 100.0000 mg | ORAL_TABLET | Freq: Every day | ORAL | 0 refills | Status: DC

## 2024-05-04 NOTE — Addendum Note (Signed)
 Addended by: DELFINO AUGUSTIN BROCKS on: 05/04/2024 01:51 PM   Modules accepted: Orders

## 2024-05-04 NOTE — Telephone Encounter (Signed)
 Called the patient back. She is scheduled for her yearly apt in August with Megan Millikan. NP.  Pt has been taking seroquel  75 mg at bedtime and states that she recently has noticed it is not lasting her through the night. She states that she is able to get to sleep and after couple hours is waking up and unable to fall back asleep. She is asking what next steps would be. Looked to see if I could work the patient in sooner and at this time there is nothing available. Will ask Dr Dohmeier her thoughts and see we can start something up prior to her upcoming visit with Megan, NP.

## 2024-05-04 NOTE — Telephone Encounter (Signed)
 Called the pt and advised her that Dr Chalice will increase to 100 mg at bedtime. Pt asked if there is a dosage where she can take less amount of pills. Informed her that it does come in 100 mg dose and will send updated script to pharmacy for that. She was appreciative

## 2024-05-11 ENCOUNTER — Ambulatory Visit: Payer: PPO | Admitting: Adult Health

## 2024-05-11 DIAGNOSIS — F4322 Adjustment disorder with anxiety: Secondary | ICD-10-CM | POA: Diagnosis not present

## 2024-05-26 ENCOUNTER — Telehealth: Payer: Self-pay | Admitting: Adult Health

## 2024-05-26 NOTE — Telephone Encounter (Signed)
 Pt called back and has been r/s for 8-13 1:30 appointment, she is aware to arrive at 1pm

## 2024-05-26 NOTE — Telephone Encounter (Signed)
 LVM and sent mychart msg informing pt of need to reschedule 06/01/24 appt - NP out   If patient calls back you can offer a slot with Megan in the afternoon on 06/03/24

## 2024-06-01 ENCOUNTER — Encounter: Payer: Self-pay | Admitting: Family Medicine

## 2024-06-01 ENCOUNTER — Ambulatory Visit (INDEPENDENT_AMBULATORY_CARE_PROVIDER_SITE_OTHER): Admitting: Family Medicine

## 2024-06-01 ENCOUNTER — Ambulatory Visit: Payer: PPO | Admitting: Adult Health

## 2024-06-01 VITALS — BP 134/70 | HR 68 | Temp 97.9°F | Ht 63.0 in | Wt 123.2 lb

## 2024-06-01 DIAGNOSIS — F4322 Adjustment disorder with anxiety: Secondary | ICD-10-CM

## 2024-06-01 NOTE — Assessment & Plan Note (Signed)
 Improving w/ increased dose of Fluoxetine .  Pt reports those around her have commented on the improvement.  Feels that right now things are going well.  No changes at this time.  Will continue to follow.

## 2024-06-01 NOTE — Progress Notes (Signed)
   Subjective:    Patient ID: [name], female    DOB: 1946/07/08, 78 y.o.   MRN: [id number]  HPI Adjustment d/o- at last visit we increased her Fluoxetine  to 20mg  daily.  Multiple close friends have told her that she 'doesnt seem as anxious'.  She has decided to take over the family finances and thankfully this has gone smoothly.     Review of Systems For ROS see HPI     Objective:   Physical Exam Vitals reviewed.  Constitutional:      General: She is not in acute distress.    Appearance: Normal appearance. She is not ill-appearing.  HENT:     Head: Normocephalic and atraumatic.  Eyes:     Extraocular Movements: Extraocular movements intact.     Conjunctiva/sclera: Conjunctivae normal.  Skin:    General: Skin is warm and dry.  Neurological:     General: No focal deficit present.     Mental Status: She is alert and oriented to person, place, and time.  Psychiatric:        Mood and Affect: Mood normal.        Behavior: Behavior normal.        Thought Content: Thought content normal.           Assessment & Plan:

## 2024-06-01 NOTE — Patient Instructions (Signed)
 Schedule your complete physical in 6 months Continue the Fluoxetine  20mg  daily No med changes at this time Call with any questions or concerns Stay Safe!  Stay Healthy! Enjoy the rest of your summer!!!

## 2024-06-03 ENCOUNTER — Ambulatory Visit: Admitting: Adult Health

## 2024-06-03 ENCOUNTER — Encounter: Payer: Self-pay | Admitting: Adult Health

## 2024-06-03 VITALS — BP 149/97 | HR 76 | Temp 78.0°F | Ht 63.0 in | Wt 121.4 lb

## 2024-06-03 DIAGNOSIS — G4700 Insomnia, unspecified: Secondary | ICD-10-CM

## 2024-06-03 MED ORDER — QUETIAPINE FUMARATE 25 MG PO TABS
100.0000 mg | ORAL_TABLET | Freq: Every day | ORAL | 11 refills | Status: DC
Start: 2024-06-03 — End: 2024-06-17

## 2024-06-03 NOTE — Progress Notes (Signed)
 PATIENT: [name] DOB: 01-Aug-1946  REASON FOR VISIT: follow up HISTORY FROM: patient Primary neurologist: Dr. Chalice  Chief Complaint  Patient presents with   Follow-up    Pt in 19 alone Pt here for chronic insomnia.  Pt wants to discuss Seroquel  dosage       HISTORY OF PRESENT ILLNESS: Today 06/03/24:  [name] is a 78 y.o. female with a history of insomnia. Returns today for follow-up.  Overall she has been doing well.  She is currently on Seroquel  100 mg at bedtime.  She states that she has the 25 mg tablets.  She does 50 mg at bedtime and intends to wake up around 2 AM and will take additional 50 mg.  She continues to take tizanidine  4 mg at bedtime.  Overall this is working well for her.  She denies any new issues.  Returns today for an evaluation.     05/01/21: [name] is a 78 year old female with a history of insomnia.  She returns today for follow-up.  She remains on Seroquel  75 mg at bedtime and tizanidine  4 mg at bedtime.  She reports that this continues to work well for her.  She denies any new issues.  She returns today for an evaluation.  05/02/20: [name] is a 78 year old female with a history of insomnia.  She returns today for follow-up.  She tried trazodone  but did not find it beneficial.  She is back on Seroquel  and tizanidine .  She reports that the combination works well for her.  She denies any new issues.  She returns today for an evaluation.  HISTORY (Copied from Dr.Dohmeier's note) [name] is a 78 y.o. year old White or Caucasian female patient seen here on 10/26/2019.  I have the pleasure of seeing [name] today, a right -handed White or Caucasian female with a possible sleep disorder.  She has a  has a past medical history of Arthritis, Cataract, Fracture (10/2016), History of hiatal hernia, Hypothyroidism, Insomnia due to anxiety and fear, and Snoring (09/29/2014).[name]   HPI:  [name] is a 78 y.o. female  Is seen here  as a revisit  from Dr. Mahlon for chronic insomnia, treated with Seroquel  .  Her last 4 appointments  were with Elveria Lunger, [facility].  She reports cyclic insomnia, and related to stressful situation. Her father was an alcoholic and air Orthoptist. The patient moved a lot with her family and has had lifelong fear of going to sleep, related to her experiences with an intoxicated father coming home and being belligerent, but she was never physically abused. [name]  She was born [place], her brother in [place] and her sister in [place]. She never lived in a place longer than 4 years. She lives in [place] since 1974.    Patient returns for followup after last visit 12-302019- Cervical spine disease, controlled with regular exercise and massages. She has a history of chronic insomnia and is currently on Seroquel  doing well on that medication she continues to exercise daily. She has no new neurologic complaints.    REVIEW OF SYSTEMS: Out of a complete 14 system review of symptoms, the patient complains only of the following symptoms, and all other reviewed systems are negative.  ALLERGIES: Allergies  Allergen Reactions   Codeine Nausea And Vomiting   Formaldehyde Other (See Comments)    Per allergy testing, tested allergic    HOME MEDICATIONS: Outpatient Medications Prior to Visit  Medication  Sig Dispense Refill   Calcium Carb-Cholecalciferol (CALCIUM + D3 PO) Take 1 capsule by mouth 3 (three) times daily.     diclofenac Sodium (VOLTAREN) 1 % GEL Apply 2 g topically 4 (four) times daily as needed (for soreness or pain).     Evening Primrose Oil CAPS Take 2 capsules by mouth daily.     FLUoxetine  (PROZAC ) 20 MG capsule Take 1 capsule (20 mg total) by mouth daily. 90 capsule 3   GLUCOSAMINE PO Take 3,000 mg by mouth daily.     levothyroxine  (SYNTHROID ) 50 MCG tablet Take 1 tablet (50 mcg total) by mouth daily before breakfast. 90 tablet 1   Multiple Vitamin (MULTIVITAMIN) capsule Take 1 capsule by  mouth daily.     Omega-3 Fatty Acids (OMEGA 3 PO) Take 3,600 mg by mouth daily.     QUEtiapine  (SEROQUEL ) 100 MG tablet Take 1 tablet (100 mg total) by mouth at bedtime. 90 tablet 0   risedronate (ACTONEL) 35 MG tablet Take 35 mg by mouth once a week.     tiZANidine  (ZANAFLEX ) 4 MG tablet TAKE 1 TABLET(4 MG) BY MOUTH AT BEDTIME 90 tablet 3   No facility-administered medications prior to visit.    PAST MEDICAL HISTORY: Past Medical History:  Diagnosis Date   Arthritis    Dr Dolphus   Cataract    Fracture 10/2016   left ankel   History of hiatal hernia    found on endoscopy   Hypothyroidism    Infected laceration 05/30/2022   Insomnia due to anxiety and fear    Snoring 09/29/2014    PAST SURGICAL HISTORY: Past Surgical History:  Procedure Laterality Date   ABDOMINAL HYSTERECTOMY  1987   TAH,BSO, APPENDECTOMY   APPENDECTOMY  1987   APPENDECTOMY AT [facility],BSO   CATARACT EXTRACTION, BILATERAL Bilateral    20 plus years ago   COLONOSCOPY      X 3; [facility]. All negative   KNEE ARTHROSCOPY Bilateral    METATARSAL OSTEOTOMY WITH BUNIONECTOMY Left 10/31/2017   Procedure: Left First Metatarsal Scarf, Modified McBride and Akin Osteotomies;  Surgeon: Kit Rush, MD;  Location: [facility];  Service: Orthopedics;  Laterality: Left;   REPLACEMENT TOTAL KNEE Bilateral    X 1 each; Dr Beverley FRIED CUFF REPAIR Right 2010    FAMILY HISTORY: Family History  Problem Relation Age of Onset   Ovarian cancer Mother 10   Alcohol abuse Father    Cirrhosis Father    Stroke Sister 3   Diabetes Paternal Grandmother    Heart disease Neg Hx    Hypertension Neg Hx    Hyperlipidemia Neg Hx    Colon cancer Neg Hx    Esophageal cancer Neg Hx     SOCIAL HISTORY: Social History   Socioeconomic History   Marital status: Married    Spouse name: Not on file   Number of children: 0   Years of education: Not on file   Highest education level: Some college, no degree   Occupational History   Occupation: retired    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 Mahlon       Retired    Social Drivers of Health   Financial Resource Strain: Low Risk  (10/25/2023)   Overall Financial Resource Strain (CARDIA)    Difficulty of Paying Living Expenses: Not hard at all  Food Insecurity: No Food Insecurity (10/25/2023)   Hunger Vital Sign    Worried About Running Out of Food in the Last Year: Never true    Ran Out of Food in the Last Year: Never true  Transportation Needs: No Transportation Needs (10/25/2023)   PRAPARE - Administrator, Civil Service (Medical): No    Lack of Transportation (Non-Medical): No  Physical Activity: Sufficiently Active (10/25/2023)   Exercise Vital Sign    Days of Exercise per Week: 4 days    Minutes of Exercise per Session: 50 min  Stress: No Stress Concern Present (10/25/2023)   [employer] of Occupational Health - Occupational Stress Questionnaire    Feeling of Stress : Not at all  Social Connections: Unknown (10/25/2023)   Social Connection and Isolation Panel    Frequency of Communication with Friends and Family: Twice a week    Frequency of Social Gatherings with Friends and Family: Once a week    Attends Religious Services: Patient declined    Database administrator or Organizations: No    Attends Engineer, structural: More than 4 times per year    Marital Status: Married  Catering manager Violence: Not At Risk  (06/20/2023)   Humiliation, Afraid, Rape, and Kick questionnaire    Fear of Current or Ex-Partner: No    Emotionally Abused: No    Physically Abused: No    Sexually Abused: No      PHYSICAL EXAM  Vitals:   06/03/24 1311  BP: (!) 152/77  Pulse: 76  Weight: 121 lb 6.4 oz (55.1 kg)  Height: 5' 3 (1.6 m)   Body mass index is 21.51 kg/m.  Generalized: Well developed, in no acute distress   Neurological examination  Mentation: Alert oriented to time, place, history taking. Follows all commands speech and language fluent Cranial nerve II-XII: Pupils were equal round reactive to light. Extraocular movements were full, visual field were full on confrontational test. . Head turning and shoulder shrug  were normal and symmetric. Motor: The motor testing reveals 5 over 5 strength of all 4 extremities. Good symmetric motor tone is noted throughout.  Sensory: Sensory testing is intact to soft touch on all 4 extremities. No evidence of extinction is noted.  Coordination: Cerebellar testing reveals good finger-nose-finger and heel-to-shin bilaterally.  Gait and station: Gait is normal.   DIAGNOSTIC DATA (LABS, IMAGING, TESTING) - I reviewed patient records, labs, notes, testing and imaging myself where available.  Lab Results  Component Value Date   WBC 5.2 12/16/2023   HGB 14.1 12/16/2023   HCT 41.5 12/16/2023   MCV 99.3 12/16/2023   PLT 200.0 12/16/2023      Component Value Date/Time   NA 141 12/16/2023 0938   NA 139 09/23/2013 1458   K 4.6 12/16/2023 0938   CL 103 12/16/2023 0938   CO2 27 12/16/2023 0938   GLUCOSE 105 (H) 12/16/2023 0938   BUN 16 12/16/2023 9061  BUN 25 09/23/2013 1458   CREATININE 0.62 12/16/2023 0938   CALCIUM 10.0 12/16/2023 0938   PROT 7.2 12/16/2023 0938   PROT 7.3 09/23/2013 1458   ALBUMIN 4.4 12/16/2023 0938   ALBUMIN 4.7 09/23/2013 1458   AST 13 12/16/2023 0938   ALT 17 12/16/2023 0938   ALKPHOS 48 12/16/2023 0938   BILITOT 0.7 12/16/2023  0938   GFRNONAA >60 01/14/2022 0325   GFRAA 88 09/23/2013 1458   Lab Results  Component Value Date   CHOL 212 (H) 12/16/2023   HDL 88.80 12/16/2023   LDLCALC 99 12/16/2023   TRIG 118.0 12/16/2023   CHOLHDL 2 12/16/2023    Lab Results  Component Value Date   TSH 3.96 12/16/2023      ASSESSMENT AND PLAN 78 y.o. year old female  has a past medical history of Arthritis, Cataract, Fracture (10/2016), History of hiatal hernia, Hypothyroidism, Infected laceration (05/30/2022), Insomnia due to anxiety and fear, and Snoring (09/29/2014). here with:  Insomnia  Continue Seroquel  100 mg at bedtime-okay to split the dose taking 50 mg at bedtime and then 50 mg at 2 AM Continue tizanidine  4 mg at bedtime Advised if symptoms worsen or she develops new symptoms she should let us  know Follow-up in 1 year or sooner if needed   Duwaine Russell, MSN, NP-C 06/03/2024, 1:38 PM [facility] [address] [place], [place] [address] [phone]  The patient's condition requires frequent monitoring and adjustments in the treatment plan, reflecting the ongoing complexity of care.  This provider is the continuing focal point for all needed services for this condition.

## 2024-06-03 NOTE — Patient Instructions (Addendum)
 Your Plan:  Continue Seroquel  100 mg at bedtime-okay to split the dose taking 50 mg at bedtime and then 50 mg at 2 AM Continue tizanidine  4 mg at bedtime      Thank you for coming to see us  at [facility]. I hope we have been able to provide you high quality care today.  You may receive a patient satisfaction survey over the next few weeks. We would appreciate your feedback and comments so that we may continue to improve ourselves and the health of our patients.

## 2024-06-08 DIAGNOSIS — F4322 Adjustment disorder with anxiety: Secondary | ICD-10-CM | POA: Diagnosis not present

## 2024-06-17 ENCOUNTER — Telehealth: Payer: Self-pay | Admitting: *Deleted

## 2024-06-17 MED ORDER — QUETIAPINE FUMARATE 50 MG PO TABS: 100.0000 mg | ORAL_TABLET | Freq: Every day | ORAL | 3 refills | Status: AC

## 2024-06-17 NOTE — Telephone Encounter (Signed)
 Please let the patient know that Dr. Chalice and I have both reviewed this note.  We will address her concerns with the appropriate staff

## 2024-06-17 NOTE — Telephone Encounter (Signed)
 I called pt and relayed the information retrieved from pharmacy.  I cancelled her 25mg  seroquel  refills and changed script to 50mg  tabs (take 100mg  at bedtime).  She will take 50mg  as she has done at 2100 and 0200.  She appreciated this.  She asked about a letter that she brought to appt and was given to Megan, NP for Dr. Chalice.  She had not heard back.  Wanted follow up.

## 2024-06-17 NOTE — Telephone Encounter (Signed)
 Received PA that plan does not cover this medication.  Needs PA .  I called pt and asked her about her medication.  She had been on seroquel  and has been getting her medication but now her pharmacy stating insurance ony pay for 90 tabs 30 day supply.  If she pays out of pocket for 90 day 360 tabs will be over $1000.00.  They do have 50mg  tablets would this be ok prescribe.  No scored tablets at the pharmacy.

## 2024-06-17 NOTE — Telephone Encounter (Signed)
 I called pt and let her know that both [name] and Dr. Chalice did review the note and will address her concerns with the appropriate staff.  She did pick up her medication and was about $40.00 for 90 day supply.  She appreciated the call.

## 2024-06-18 DIAGNOSIS — M79645 Pain in left finger(s): Secondary | ICD-10-CM | POA: Diagnosis not present

## 2024-06-18 DIAGNOSIS — M79672 Pain in left foot: Secondary | ICD-10-CM | POA: Diagnosis not present

## 2024-06-23 DIAGNOSIS — M79645 Pain in left finger(s): Secondary | ICD-10-CM | POA: Diagnosis not present

## 2024-06-25 DIAGNOSIS — M79642 Pain in left hand: Secondary | ICD-10-CM | POA: Diagnosis not present

## 2024-06-26 DIAGNOSIS — M25642 Stiffness of left hand, not elsewhere classified: Secondary | ICD-10-CM | POA: Diagnosis not present

## 2024-06-29 DIAGNOSIS — M79642 Pain in left hand: Secondary | ICD-10-CM | POA: Diagnosis not present

## 2024-06-29 DIAGNOSIS — M79645 Pain in left finger(s): Secondary | ICD-10-CM | POA: Diagnosis not present

## 2024-06-29 DIAGNOSIS — M25642 Stiffness of left hand, not elsewhere classified: Secondary | ICD-10-CM | POA: Diagnosis not present

## 2024-07-03 DIAGNOSIS — S8002XA Contusion of left knee, initial encounter: Secondary | ICD-10-CM | POA: Diagnosis not present

## 2024-07-03 DIAGNOSIS — S5001XA Contusion of right elbow, initial encounter: Secondary | ICD-10-CM | POA: Diagnosis not present

## 2024-07-09 DIAGNOSIS — S62617A Displaced fracture of proximal phalanx of left little finger, initial encounter for closed fracture: Secondary | ICD-10-CM | POA: Diagnosis not present

## 2024-07-13 DIAGNOSIS — F4322 Adjustment disorder with anxiety: Secondary | ICD-10-CM | POA: Diagnosis not present

## 2024-08-03 DIAGNOSIS — F4322 Adjustment disorder with anxiety: Secondary | ICD-10-CM | POA: Diagnosis not present

## 2024-08-17 DIAGNOSIS — F411 Generalized anxiety disorder: Secondary | ICD-10-CM | POA: Diagnosis not present

## 2024-08-27 ENCOUNTER — Other Ambulatory Visit: Payer: Self-pay | Admitting: *Deleted

## 2024-08-27 MED ORDER — TIZANIDINE HCL 4 MG PO TABS: ORAL_TABLET | ORAL | 3 refills | Status: DC

## 2024-09-07 DIAGNOSIS — Z85828 Personal history of other malignant neoplasm of skin: Secondary | ICD-10-CM | POA: Diagnosis not present

## 2024-09-07 DIAGNOSIS — L570 Actinic keratosis: Secondary | ICD-10-CM | POA: Diagnosis not present

## 2024-09-07 DIAGNOSIS — L814 Other melanin hyperpigmentation: Secondary | ICD-10-CM | POA: Diagnosis not present

## 2024-09-07 DIAGNOSIS — L821 Other seborrheic keratosis: Secondary | ICD-10-CM | POA: Diagnosis not present

## 2024-09-07 DIAGNOSIS — D485 Neoplasm of uncertain behavior of skin: Secondary | ICD-10-CM | POA: Diagnosis not present

## 2024-09-28 DIAGNOSIS — F411 Generalized anxiety disorder: Secondary | ICD-10-CM | POA: Diagnosis not present

## 2024-10-13 ENCOUNTER — Other Ambulatory Visit: Payer: Self-pay

## 2024-10-13 MED ORDER — LEVOTHYROXINE SODIUM 50 MCG PO TABS: 50.0000 ug | ORAL_TABLET | Freq: Every day | ORAL | 1 refills | Status: AC

## 2024-11-18 ENCOUNTER — Telehealth: Payer: Self-pay | Admitting: Adult Health

## 2024-11-18 MED ORDER — TIZANIDINE HCL 4 MG PO TABS: ORAL_TABLET | ORAL | 3 refills | Status: AC

## 2024-11-18 NOTE — Telephone Encounter (Signed)
 Pt called to request  to get some more medication the patient stated her husband passed so she was throwing away hos old medication and may have accidentally threw her medication away as well tiZANidine  (ZANAFLEX ) 4 MG tablet    Pharmacy informed Pt to call MD office    [id number] - [place],  - [address] (Ph: [phone])

## 2024-11-18 NOTE — Telephone Encounter (Signed)
 Pt aware refill request  has been sent to pharmacy

## 2024-12-10 ENCOUNTER — Ambulatory Visit: Admitting: Adult Health

## 2024-12-16 ENCOUNTER — Encounter: Payer: PPO | Admitting: Family Medicine

## 2025-06-07 ENCOUNTER — Ambulatory Visit: Admitting: Adult Health
